# Patient Record
Sex: Female | Born: 1950 | Race: White | Hispanic: No | Marital: Single | Smoking: Former smoker
Health system: Southern US, Community
[De-identification: ages and names within clinical notes are randomized; demographics above are authoritative.]

## PROBLEM LIST (undated history)

## (undated) ENCOUNTER — Encounter

## (undated) ENCOUNTER — Ambulatory Visit: Payer: Medicare (Managed Care) | Attending: Rheumatology | Primary: Rheumatology

## (undated) ENCOUNTER — Encounter: Attending: Rheumatology | Primary: Rheumatology

## (undated) ENCOUNTER — Ambulatory Visit: Payer: MEDICARE | Attending: Rheumatology | Primary: Rheumatology

## (undated) ENCOUNTER — Ambulatory Visit

## (undated) ENCOUNTER — Ambulatory Visit
Attending: Student in an Organized Health Care Education/Training Program | Primary: Student in an Organized Health Care Education/Training Program

## (undated) ENCOUNTER — Telehealth: Attending: Rheumatology | Primary: Rheumatology

## (undated) ENCOUNTER — Ambulatory Visit: Payer: MEDICARE

## (undated) ENCOUNTER — Telehealth

## (undated) ENCOUNTER — Encounter: Attending: Ambulatory Care | Primary: Ambulatory Care

## (undated) DIAGNOSIS — E039 Hypothyroidism, unspecified: Secondary | ICD-10-CM

## (undated) DIAGNOSIS — Z86718 Personal history of other venous thrombosis and embolism: Secondary | ICD-10-CM

## (undated) DIAGNOSIS — R51 Headache: Secondary | ICD-10-CM

## (undated) DIAGNOSIS — R05 Cough: Secondary | ICD-10-CM

## (undated) DIAGNOSIS — M1990 Unspecified osteoarthritis, unspecified site: Secondary | ICD-10-CM

## (undated) DIAGNOSIS — F32A Depression, unspecified: Secondary | ICD-10-CM

## (undated) DIAGNOSIS — F329 Major depressive disorder, single episode, unspecified: Secondary | ICD-10-CM

## (undated) DIAGNOSIS — R059 Cough, unspecified: Secondary | ICD-10-CM

## (undated) DIAGNOSIS — E079 Disorder of thyroid, unspecified: Secondary | ICD-10-CM

## (undated) DIAGNOSIS — Z8719 Personal history of other diseases of the digestive system: Secondary | ICD-10-CM

## (undated) DIAGNOSIS — R519 Headache, unspecified: Secondary | ICD-10-CM

## (undated) DIAGNOSIS — M069 Rheumatoid arthritis, unspecified: Secondary | ICD-10-CM

## (undated) DIAGNOSIS — R112 Nausea with vomiting, unspecified: Secondary | ICD-10-CM

## (undated) DIAGNOSIS — Z9889 Other specified postprocedural states: Secondary | ICD-10-CM

## (undated) DIAGNOSIS — I10 Essential (primary) hypertension: Secondary | ICD-10-CM

## (undated) DIAGNOSIS — K219 Gastro-esophageal reflux disease without esophagitis: Secondary | ICD-10-CM

## (undated) HISTORY — DX: Depression, unspecified: F32.A

## (undated) HISTORY — DX: Personal history of other venous thrombosis and embolism: Z86.718

## (undated) HISTORY — PX: HAND SURGERY: SHX662

## (undated) HISTORY — DX: Unspecified osteoarthritis, unspecified site: M19.90

## (undated) HISTORY — PX: ABDOMINAL EXPLORATION SURGERY: SHX538

## (undated) HISTORY — PX: KNEE SURGERY: SHX244

## (undated) HISTORY — DX: Disorder of thyroid, unspecified: E07.9

## (undated) HISTORY — DX: Rheumatoid arthritis, unspecified: M06.9

## (undated) HISTORY — DX: Essential (primary) hypertension: I10

## (undated) HISTORY — DX: Personal history of other diseases of the digestive system: Z87.19

## (undated) HISTORY — PX: DIAGNOSTIC LAPAROSCOPY: SUR761

## (undated) HISTORY — DX: Major depressive disorder, single episode, unspecified: F32.9

---

## 1987-12-31 HISTORY — PX: WRIST SURGERY: SHX841

## 1999-12-31 HISTORY — PX: APPENDECTOMY: SHX54

## 2004-12-30 HISTORY — PX: OTHER SURGICAL HISTORY: SHX169

## 2005-11-04 DIAGNOSIS — B009 Herpesviral infection, unspecified: Secondary | ICD-10-CM | POA: Insufficient documentation

## 2005-11-04 DIAGNOSIS — J309 Allergic rhinitis, unspecified: Secondary | ICD-10-CM | POA: Insufficient documentation

## 2005-11-04 DIAGNOSIS — M069 Rheumatoid arthritis, unspecified: Secondary | ICD-10-CM | POA: Insufficient documentation

## 2006-07-01 ENCOUNTER — Ambulatory Visit: Payer: Self-pay | Admitting: Family Medicine

## 2006-07-09 ENCOUNTER — Ambulatory Visit: Payer: Self-pay | Admitting: Family Medicine

## 2007-03-17 DIAGNOSIS — F32A Depression, unspecified: Secondary | ICD-10-CM | POA: Insufficient documentation

## 2007-11-10 LAB — HM PAP SMEAR: HM Pap smear: NEGATIVE

## 2008-02-10 DIAGNOSIS — E039 Hypothyroidism, unspecified: Secondary | ICD-10-CM | POA: Insufficient documentation

## 2008-05-02 DIAGNOSIS — I10 Essential (primary) hypertension: Secondary | ICD-10-CM | POA: Insufficient documentation

## 2010-03-31 ENCOUNTER — Ambulatory Visit: Payer: Self-pay | Admitting: Family Medicine

## 2010-06-29 DIAGNOSIS — F411 Generalized anxiety disorder: Secondary | ICD-10-CM | POA: Insufficient documentation

## 2010-07-12 DIAGNOSIS — E539 Vitamin B deficiency, unspecified: Secondary | ICD-10-CM | POA: Insufficient documentation

## 2011-10-09 ENCOUNTER — Ambulatory Visit: Payer: Self-pay | Admitting: Family Medicine

## 2011-10-28 LAB — HM DEXA SCAN

## 2014-03-11 LAB — BASIC METABOLIC PANEL
BUN: 15 mg/dL (ref 4–21)
CREATININE: 1 mg/dL (ref <=1.1)
GLUCOSE: 92 mg/dL
Potassium: 4 mmol/L (ref 3.4–5.3)
Sodium: 141 mmol/L (ref 137–147)

## 2014-03-11 LAB — HEPATIC FUNCTION PANEL
ALT: 14 U/L (ref 7–35)
AST: 18 U/L (ref 13–35)

## 2014-03-11 LAB — LIPID PANEL
Cholesterol: 195 mg/dL (ref 0–200)
HDL: 52 mg/dL (ref 35–70)
LDL Cholesterol: 117 mg/dL
TRIGLYCERIDES: 129 mg/dL (ref 40–160)

## 2014-03-11 LAB — CBC AND DIFFERENTIAL
HEMATOCRIT: 38 % (ref 36–46)
Hemoglobin: 12.7 g/dL (ref 12.0–16.0)
PLATELETS: 323 10*3/uL (ref 150–399)
WBC: 8.1 10*3/mL

## 2014-03-11 LAB — TSH: TSH: 4.69 u[IU]/mL (ref <=5.90)

## 2014-09-08 ENCOUNTER — Ambulatory Visit: Payer: Self-pay | Admitting: Family Medicine

## 2015-11-30 DIAGNOSIS — M19049 Primary osteoarthritis, unspecified hand: Secondary | ICD-10-CM | POA: Insufficient documentation

## 2015-12-11 ENCOUNTER — Other Ambulatory Visit: Payer: Self-pay | Admitting: Family Medicine

## 2015-12-12 NOTE — Telephone Encounter (Signed)
Dr. Reece Agar Patient has not had TSH since 02/2014.

## 2016-03-07 ENCOUNTER — Other Ambulatory Visit: Payer: Self-pay | Admitting: Family Medicine

## 2016-04-09 ENCOUNTER — Ambulatory Visit (INDEPENDENT_AMBULATORY_CARE_PROVIDER_SITE_OTHER): Payer: Medicare PPO | Admitting: Physician Assistant

## 2016-04-09 ENCOUNTER — Encounter: Payer: Self-pay | Admitting: Physician Assistant

## 2016-04-09 VITALS — BP 122/80 | HR 118 | Temp 98.1°F | Resp 19 | Wt 194.4 lb

## 2016-04-09 DIAGNOSIS — E7800 Pure hypercholesterolemia, unspecified: Secondary | ICD-10-CM | POA: Insufficient documentation

## 2016-04-09 DIAGNOSIS — I10 Essential (primary) hypertension: Secondary | ICD-10-CM

## 2016-04-09 DIAGNOSIS — J40 Bronchitis, not specified as acute or chronic: Secondary | ICD-10-CM | POA: Diagnosis not present

## 2016-04-09 DIAGNOSIS — E559 Vitamin D deficiency, unspecified: Secondary | ICD-10-CM | POA: Insufficient documentation

## 2016-04-09 MED ORDER — AZITHROMYCIN 250 MG PO TABS: ORAL_TABLET | ORAL | Status: DC

## 2016-04-09 MED ORDER — TRIAMTERENE-HCTZ 37.5-25 MG PO TABS: 1.0000 | ORAL_TABLET | Freq: Every day | ORAL | Status: DC

## 2016-04-09 MED ORDER — PREDNISONE 10 MG PO TABS: ORAL_TABLET | ORAL | Status: DC

## 2016-04-09 NOTE — Progress Notes (Signed)
Patient: Angela Sherman Female    DOB: December 29, 1951   65 y.o.   MRN: [id number] Visit Date: 04/09/2016  Today's Provider: Margaretann Loveless, PA-C   Chief Complaint  Patient presents with  . URI   Subjective:    URI  This is a new problem. The current episode started 1 to 4 weeks ago (1 weeks-but since beginning of Feb she has been sick 4 times including flu). The problem has been gradually worsening. Maximum temperature: Fever yesterday and Sunday unmeasured. Associated symptoms include congestion, coughing, headaches, rhinorrhea, sinus pain, sneezing, a sore throat and wheezing. Pertinent negatives include no abdominal pain, chest pain, dysuria, ear pain, nausea, plugged ear sensation, swollen glands or vomiting. She has tried acetaminophen and increased fluids for the symptoms. The treatment provided no relief.       Allergies  Allergen Reactions  . Aspirin   . Ciprofloxacin   . Clinoril  [Sulindac]   . Demerol  [Meperidine]   . Methotrexate Derivatives   . Nsaids   . Penicillins   . Remicade  [Infliximab]   . Codeine Nausea And Vomiting    Severe vomiting   Previous Medications   CHOLECALCIFEROL (VITAMIN D) 1000 UNITS TABLET    Take by mouth.   DULOXETINE (CYMBALTA) 30 MG CAPSULE    Take by mouth.   LEVOTHYROXINE (SYNTHROID, LEVOTHROID) 75 MCG TABLET    TAKE 1 TABLET EVERY DAY   TRIAMTERENE-HYDROCHLOROTHIAZIDE (MAXZIDE-25) 37.5-25 MG TABLET    Take by mouth. Reported on 04/09/2016    Review of Systems  Constitutional: Positive for fever (felt feverish but not measured) and fatigue. Negative for chills.  HENT: Positive for congestion, postnasal drip, rhinorrhea, sinus pressure, sneezing and sore throat. Negative for ear pain and tinnitus.   Respiratory: Positive for cough, chest tightness, shortness of breath and wheezing.   Cardiovascular: Negative for chest pain, palpitations and leg swelling.  Gastrointestinal: Negative for nausea, vomiting and abdominal pain.    Genitourinary: Negative for dysuria.  Neurological: Positive for headaches. Negative for dizziness.    Social History  Substance Use Topics  . Smoking status: Former Games developer  . Smokeless tobacco: Not on file  . Alcohol Use: Yes     Comment: Occasional   Objective:   BP 122/80 mmHg  Pulse 118  Temp(Src) 98.1 F (36.7 C) (Oral)  Resp 19  Wt 194 lb 6.4 oz (88.179 kg)  SpO2 97%  Physical Exam  Constitutional: She appears well-developed and well-nourished. No distress.  HENT:  Head: Normocephalic and atraumatic.  Right Ear: Hearing, tympanic membrane, external ear and ear canal normal.  Left Ear: Hearing, tympanic membrane, external ear and ear canal normal.  Nose: Mucosal edema and rhinorrhea present. Right sinus exhibits maxillary sinus tenderness. Right sinus exhibits no frontal sinus tenderness. Left sinus exhibits maxillary sinus tenderness. Left sinus exhibits no frontal sinus tenderness.  Mouth/Throat: Uvula is midline, oropharynx is clear and moist and mucous membranes are normal. No oropharyngeal exudate, posterior oropharyngeal edema or posterior oropharyngeal erythema.  Neck: Normal range of motion. Neck supple. No tracheal deviation present. No thyromegaly present.  Cardiovascular: Regular rhythm and normal heart sounds.  Tachycardia present.  Exam reveals no gallop and no friction rub.   No murmur heard. Pulmonary/Chest: Effort normal. No stridor. No respiratory distress. She has no decreased breath sounds. She has wheezes (mild expiratory throughout). She has no rhonchi. She has no rales.  Lymphadenopathy:    She has no cervical adenopathy.  Skin:  She is not diaphoretic.  Vitals reviewed.       Assessment & Plan:     1. Bronchitis Worsening symptoms. Will treat with z-pak and prednisone taper as below. Discussed using mucinex during the day and delsym at night for cough suppression. She needs to stay well hydrated and try to get plenty of rest. She is to call the  office if symptoms worsen or fail to improve. - azithromycin (ZITHROMAX) 250 MG tablet; Take 2 tablets PO on day one, and one tablet PO daily thereafter until completed.  Dispense: 6 tablet; Refill: 0 - predniSONE (DELTASONE) 10 MG tablet; Take 6 tabs PO on day 1&2, 5 tabs PO on day 3&4, 4 tabs PO on day 5&6, 3 tabs PO on day 7&8, 2 tabs PO on day 9&10, 1 tab PO on day 11&12.  Dispense: 42 tablet; Refill: 0  2. Essential hypertension Stable. Diagnosis pulled for medication refill. Continue current medical treatment plan. - triamterene-hydrochlorothiazide (MAXZIDE-25) 37.5-25 MG tablet; Take 1 tablet by mouth daily. Reported on 04/09/2016  Dispense: 90 tablet; Refill: 3      Margaretann Loveless, PA-C  [facility]

## 2016-04-09 NOTE — Patient Instructions (Signed)

## 2016-04-12 ENCOUNTER — Encounter: Payer: Self-pay | Admitting: Physician Assistant

## 2016-04-23 ENCOUNTER — Encounter: Payer: Self-pay | Admitting: Family Medicine

## 2016-04-23 ENCOUNTER — Ambulatory Visit (INDEPENDENT_AMBULATORY_CARE_PROVIDER_SITE_OTHER): Payer: Medicare PPO | Admitting: Family Medicine

## 2016-04-23 VITALS — BP 96/66 | HR 74 | Temp 97.8°F | Resp 16 | Ht 66.0 in | Wt 195.0 lb

## 2016-04-23 DIAGNOSIS — R1011 Right upper quadrant pain: Secondary | ICD-10-CM | POA: Diagnosis not present

## 2016-04-23 DIAGNOSIS — K219 Gastro-esophageal reflux disease without esophagitis: Secondary | ICD-10-CM

## 2016-04-23 DIAGNOSIS — K2970 Gastritis, unspecified, without bleeding: Secondary | ICD-10-CM | POA: Diagnosis not present

## 2016-04-23 DIAGNOSIS — M549 Dorsalgia, unspecified: Secondary | ICD-10-CM | POA: Diagnosis not present

## 2016-04-23 MED ORDER — OMEPRAZOLE 20 MG PO CPDR: 20.0000 mg | DELAYED_RELEASE_CAPSULE | Freq: Every day | ORAL | Status: DC

## 2016-04-23 NOTE — Progress Notes (Signed)
Patient ID: Angela Sherman, female   DOB: 04-16-1951, 65 y.o.   MRN: [id number]    Subjective:  HPI Pt is here today for abdominal pain, diarrhea and vomiting. She reports that the pain is just under her rib cage on the right side, intermittent for a couple of months. She reports that it has gotten worse over the past couple of days. She was vomiting yesterday and diarrhea. She reports that she has not eaten much since yesterday but her stomach will start to hurt about an hour after she eats.   Prior to Admission medications   Medication Sig Start Date End Date Taking? Authorizing Provider  cholecalciferol (VITAMIN D) 1000 units tablet Take by mouth.   Yes Historical Provider, MD  DULoxetine (CYMBALTA) 30 MG capsule Take by mouth. 05/14/15  Yes Historical Provider, MD  levothyroxine (SYNTHROID, LEVOTHROID) 75 MCG tablet TAKE 1 TABLET EVERY DAY 12/13/15  Yes Maple Hudson., MD  triamterene-hydrochlorothiazide (MAXZIDE-25) 37.5-25 MG tablet Take 1 tablet by mouth daily. Reported on 04/09/2016 Patient not taking: Reported on 04/23/2016 04/09/16   Margaretann Loveless, PA-C    Patient Active Problem List   Diagnosis Date Noted  . Hypercholesteremia 04/09/2016  . Avitaminosis D 04/09/2016  . 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 Vitamin B deficiency 07/12/2010  . Anxiety, generalized 06/29/2010  . Essential (primary) hypertension 05/02/2008  . Adult hypothyroidism 02/10/2008  . Clinical depression 03/17/2007  . Allergic rhinitis 11/04/2005  . Arthritis or polyarthritis, rheumatoid (HCC) 11/04/2005  . Herpes 11/04/2005    Past Medical History  Diagnosis Date  . Anxiety   . Hypertension   . Thyroid disease   . Depression   . Arthritis     Social History   Social History  . Marital Status: Single    Spouse Name: N/A  . Number of Children: N/A  . Years of Education: N/A   Occupational History  . Not on file.   Social History Main Topics  . Smoking status: Former Games developer  .  Smokeless tobacco: Not on file  . Alcohol Use: Yes     Comment: Occasional  . Drug Use: No  . Sexual Activity: No   Other Topics Concern  . Not on file   Social History Narrative    Allergies  Allergen Reactions  . Aspirin   . Ciprofloxacin   . Clinoril  [Sulindac]   . Demerol  [Meperidine]   . Methotrexate Derivatives   . Nsaids   . Penicillins   . Remicade  [Infliximab]   . Codeine Nausea And Vomiting    Severe vomiting    Review of Systems  Constitutional: Positive for malaise/fatigue.  HENT: Negative.   Eyes: Negative.   Respiratory: Negative.   Cardiovascular: Negative.   Gastrointestinal: Positive for heartburn, nausea, vomiting and abdominal pain.  Genitourinary: Negative.   Musculoskeletal: Negative.   Skin: Negative.   Neurological: Negative.   Endo/Heme/Allergies: Negative.   Psychiatric/Behavioral: Negative.     Immunization History  Administered Date(s) Administered  . Influenza-Unspecified 10/20/2015  . Pneumococcal Polysaccharide-23 10/24/2009  . Tdap 11/03/2007, 12/09/2011   Objective:  BP 96/66 mmHg  Pulse 74  Temp(Src) 97.8 F (36.6 C) (Oral)  Resp 16  Ht 5\' 6"  (1.676 m)  Wt 195 lb (88.451 kg)  BMI 31.49 kg/m2  Physical Exam  Constitutional: She is oriented to person, place, and time and well-developed, well-nourished, and in no distress.  Eyes: Conjunctivae and EOM are normal. Pupils are equal, round, and reactive to  light.  Neck: Normal range of motion. Neck supple.  Cardiovascular: Normal rate, regular rhythm, normal heart sounds and intact distal pulses.   Pulmonary/Chest: Effort normal and breath sounds normal.  Abdominal: Soft. Bowel sounds are normal.  equivocal Murphy's sign.  Genitourinary:  Mild right CVA tenderness  Musculoskeletal: Normal range of motion.  Neurological: She is alert and oriented to person, place, and time. She has normal reflexes. Gait normal. GCS score is 15.  Skin: Skin is warm and dry.  Psychiatric:  Mood, memory, affect and judgment normal.    Lab Results  Component Value Date   WBC 8.1 03/11/2014   HGB 12.7 03/11/2014   HCT 38 03/11/2014   PLT 323 03/11/2014   CHOL 195 03/11/2014   TRIG 129 03/11/2014   HDL 52 03/11/2014   LDLCALC 117 03/11/2014   TSH 4.69 03/11/2014    CMP     Component Value Date/Time   NA 141 03/11/2014   K 4.0 03/11/2014   BUN 15 03/11/2014   CREATININE 1.0 03/11/2014   AST 18 03/11/2014   ALT 14 03/11/2014    Assessment and Plan :  1. RUQ pain  - CBC with Differential/Platelet - Comprehensive metabolic panel - Lipase - US Abdomen Complete; Future  2. Gastroesophageal reflux disease, esophagitis presence not specified  - omeprazole (PRILOSEC) 20 MG capsule; Take 1 capsule (20 mg total) by mouth daily.  Dispense: 30 capsule; Refill: 3  3. Gastritis    I have done the exam and reviewed the above chart and it is accurate to the best of my knowledge.  Patient was seen and examined by Dr. [name], and noted scribed by Dimas Chyle, CMA  [name] MD [facility] 04/23/2016 4:26 PM

## 2016-04-24 ENCOUNTER — Telehealth: Payer: Self-pay

## 2016-04-24 LAB — COMPREHENSIVE METABOLIC PANEL
A/G RATIO: 2 (ref 1.2–2.2)
ALT: 18 IU/L (ref 0–32)
AST: 16 IU/L (ref 0–40)
Albumin: 4.5 g/dL (ref 3.6–4.8)
Alkaline Phosphatase: 104 IU/L (ref 39–117)
BUN/Creatinine Ratio: 17 (ref 12–28)
BUN: 17 mg/dL (ref 8–27)
Bilirubin Total: 0.4 mg/dL (ref 0.0–1.2)
CALCIUM: 10.3 mg/dL (ref 8.7–10.3)
CO2: 25 mmol/L (ref 18–29)
CREATININE: 1.01 mg/dL — AB (ref 0.57–1.00)
Chloride: 101 mmol/L (ref 96–106)
GFR, EST AFRICAN AMERICAN: 69 mL/min/{1.73_m2} (ref >59)
GFR, EST NON AFRICAN AMERICAN: 60 mL/min/{1.73_m2} (ref >59)
GLUCOSE: 104 mg/dL — AB (ref 65–99)
Globulin, Total: 2.2 g/dL (ref 1.5–4.5)
POTASSIUM: 4.7 mmol/L (ref 3.5–5.2)
Sodium: 140 mmol/L (ref 134–144)
TOTAL PROTEIN: 6.7 g/dL (ref 6.0–8.5)

## 2016-04-24 LAB — CBC WITH DIFFERENTIAL/PLATELET
BASOS ABS: 0 10*3/uL (ref 0.0–0.2)
BASOS: 0 %
EOS (ABSOLUTE): 0.1 10*3/uL (ref 0.0–0.4)
Eos: 1 %
Hematocrit: 46.9 % — ABNORMAL HIGH (ref 34.0–46.6)
Hemoglobin: 15.3 g/dL (ref 11.1–15.9)
IMMATURE GRANS (ABS): 0 10*3/uL (ref 0.0–0.1)
IMMATURE GRANULOCYTES: 0 %
LYMPHS: 15 %
Lymphocytes Absolute: 1.7 10*3/uL (ref 0.7–3.1)
MCH: 27.6 pg (ref 26.6–33.0)
MCHC: 32.6 g/dL (ref 31.5–35.7)
MCV: 85 fL (ref 79–97)
MONOS ABS: 0.8 10*3/uL (ref 0.1–0.9)
Monocytes: 6 %
NEUTROS PCT: 78 %
Neutrophils Absolute: 9.1 10*3/uL — ABNORMAL HIGH (ref 1.4–7.0)
PLATELETS: 341 10*3/uL (ref 150–379)
RBC: 5.55 x10E6/uL — AB (ref 3.77–5.28)
RDW: 14.1 % (ref 12.3–15.4)
WBC: 11.8 10*3/uL — AB (ref 3.4–10.8)

## 2016-04-24 LAB — LIPASE: Lipase: 26 U/L (ref 0–59)

## 2016-04-24 NOTE — Telephone Encounter (Signed)
-----   Message from Richard L Gilbert Jr., MD sent at 04/24/2016  8:10 AM EDT ----- Labs normal. 

## 2016-04-24 NOTE — Telephone Encounter (Signed)
Patient advised as directed below. Patient verbalized understanding.  

## 2016-04-30 ENCOUNTER — Ambulatory Visit: Payer: Self-pay

## 2016-05-01 ENCOUNTER — Ambulatory Visit
Admission: RE | Admit: 2016-05-01 | Discharge: 2016-05-01 | Disposition: A | Discharge status: Home / Self Care | Payer: Medicare PPO | Source: Ambulatory Visit | Attending: Family Medicine | Admitting: Family Medicine

## 2016-05-01 ENCOUNTER — Telehealth: Payer: Self-pay

## 2016-05-01 DIAGNOSIS — K8020 Calculus of gallbladder without cholecystitis without obstruction: Secondary | ICD-10-CM | POA: Insufficient documentation

## 2016-05-01 DIAGNOSIS — R93422 Abnormal radiologic findings on diagnostic imaging of left kidney: Secondary | ICD-10-CM | POA: Insufficient documentation

## 2016-05-01 DIAGNOSIS — M549 Dorsalgia, unspecified: Secondary | ICD-10-CM | POA: Diagnosis present

## 2016-05-01 DIAGNOSIS — R1011 Right upper quadrant pain: Secondary | ICD-10-CM | POA: Diagnosis present

## 2016-05-01 NOTE — Telephone Encounter (Signed)
Please refer to surgery, order put in-aa

## 2016-05-03 ENCOUNTER — Encounter: Payer: Self-pay | Admitting: General Surgery

## 2016-05-03 ENCOUNTER — Encounter: Payer: Self-pay | Admitting: *Deleted

## 2016-05-06 ENCOUNTER — Ambulatory Visit: Payer: Medicare PPO | Admitting: Family Medicine

## 2016-05-09 ENCOUNTER — Ambulatory Visit: Payer: Self-pay | Admitting: General Surgery

## 2016-05-20 ENCOUNTER — Ambulatory Visit (INDEPENDENT_AMBULATORY_CARE_PROVIDER_SITE_OTHER): Payer: Medicare PPO | Admitting: General Surgery

## 2016-05-20 ENCOUNTER — Encounter: Payer: Self-pay | Admitting: General Surgery

## 2016-05-20 VITALS — BP 130/70 | HR 80 | Resp 12 | Ht 66.0 in | Wt 200.0 lb

## 2016-05-20 DIAGNOSIS — K8020 Calculus of gallbladder without cholecystitis without obstruction: Secondary | ICD-10-CM

## 2016-05-20 DIAGNOSIS — M069 Rheumatoid arthritis, unspecified: Secondary | ICD-10-CM | POA: Diagnosis not present

## 2016-05-20 NOTE — Progress Notes (Signed)
Patient ID: Angela Sherman, female   DOB: May 29, 1951, 65 y.o.   MRN: [id number]  Chief Complaint  Patient presents with  . Cholelithiasis    HPI Angela Sherman is a 65 y.o. female here for evaluation of her gallbladder. She had an ultrasound done on 05/01/16 showing gallstones. She has had right quadrant and flank pain for 6 months. 2 months ago she started having nausea and vomiting. She reports one episode of chalky bowel movements within the last 2 months, during an episode of pain. She experiences frequent nausea during the pain episodes, but rare episodes of vomiting. She is not clearly appreciated a dietary connection. She reports that she sleeps poorly due to diffuse joint pain from her long-standing rheumatoid arthritis, and she has not paid any particular attention to episodes of abdominal pain occurring at night.  She has been on steroids now for 35 years, presently managed on 5 mg daily. She has been through all of the biologic agents, and with Remicade had an episode described as peritonitis requiring laparoscopy followed by laparotomy during which time incidental appendectomy was completed. This was in 2001. She recalls that the physician said there was inflammation in the right upper quadrant at that time. She has had no further episodes of abdominal symptoms until recently.  The patient is accompanied today by her sister,Angela Sherman.  HPI  Past Medical History  Diagnosis Date  . Anxiety   . Hypertension   . Thyroid disease   . Depression   . Arthritis   . History of peritonitis   . History of deep vein thrombosis     Past Surgical History  Procedure Laterality Date  . Reconstruction of the right hand  2006    surgery  . Appendectomy  2001    Laparoscopy, Dx-Peritontis  . Wrist surgery Left 1989    Dx-RA  . Knee surgery Right     Family History  Problem Relation Age of Onset  . Hypertension Brother     Social History Social History  Substance Use Topics  . Smoking  status: Former Games developer  . Smokeless tobacco: None  . Alcohol Use: Yes     Comment: Occasional    Allergies  Allergen Reactions  . Aspirin   . Ciprofloxacin   . Clinoril  [Sulindac]   . Demerol  [Meperidine]   . Methotrexate Derivatives   . Nsaids   . Penicillins   . Remicade  [Infliximab]   . Codeine Nausea And Vomiting    Severe vomiting    Current Outpatient Prescriptions  Medication Sig Dispense Refill  . cholecalciferol (VITAMIN D) 1000 units tablet Take by mouth.    . DULoxetine (CYMBALTA) 30 MG capsule Take by mouth.    [name] KRILL OIL PO Take by mouth.    . levothyroxine (SYNTHROID, LEVOTHROID) 75 MCG tablet TAKE 1 TABLET EVERY DAY 90 tablet 3  . omeprazole (PRILOSEC) 20 MG capsule Take 1 capsule (20 mg total) by mouth daily. 30 capsule 3  . predniSONE (DELTASONE) 5 MG tablet Take 5 mg by mouth daily with breakfast.    . triamterene-hydrochlorothiazide (MAXZIDE-25) 37.5-25 MG tablet Take 1 tablet by mouth daily. Reported on 04/09/2016 90 tablet 3   No current facility-administered medications for this visit.    Review of Systems Review of Systems  Constitutional: Negative.   HENT: Negative.   Eyes: Negative.   Respiratory: Negative.   Cardiovascular: Negative.   Gastrointestinal: Positive for nausea, vomiting and abdominal pain (right). Negative for diarrhea,  constipation, blood in stool, abdominal distention, anal bleeding and rectal pain.  Endocrine: Negative.   Genitourinary: Negative.   Musculoskeletal: Positive for joint swelling.  Skin: Negative.   Allergic/Immunologic: Negative.   Neurological: Negative.   Hematological: Negative.   Psychiatric/Behavioral: Negative.     Blood pressure 130/70, pulse 80, resp. rate 12, height 5\' 6"  (1.676 m), weight 200 lb (90.719 kg).  Physical Exam Physical Exam  Constitutional: She is oriented to person, place, and time. She appears well-developed and well-nourished.  HENT:  Head: Normocephalic and atraumatic.  Eyes:  Conjunctivae are normal. No scleral icterus.  Neck: Neck supple. No tracheal deviation present. No thyromegaly present.  Cardiovascular: Normal rate, regular rhythm and normal heart sounds.   Pulmonary/Chest: Effort normal and breath sounds normal.    Abdominal: Soft. Bowel sounds are normal.    Midline incision above the umbilicus.   Musculoskeletal:       Arms:      Legs: Lymphadenopathy:    She has no cervical adenopathy.  Neurological: She is alert and oriented to person, place, and time.  Skin: Skin is warm and dry.  Psychiatric: She has a normal mood and affect.    Data Reviewed Abdominal ultrasound dated 05/01/2016 showed multiple gallstones measuring up to 2.7 cm in diameter. Negative Murphy sign. Common bile duct 3.4 mm. Normal hepatic architecture. IVC reported as normal. 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 CBC and comprehensive metabolic panel completed 04/26/2016 was notable for mild elevation of the white blood cell count, under 12,000 and a creatinine of 1.0 with an estimated GFR of 60. Normal liver function studies.  Assessment    Gallstones, possible passed common duct stone.  Steroid dependent rheumatoid arthritis.  Past history right lower extremity DVT, no evidence of chronic changes.  Recent wrist surgery (right) with marked local discomfort. Plans for upcoming left wrist surgery.    Plan    The patient is at increased risk for surgery based on her steroid dependence as well as the previous laparotomy including a report of an inflammatory process in the right upper quadrant.  Attempts will be made to obtain records from the 2001 exploration completed at [facility].  [facility].Laparoscopic Cholecystectomy with Intraoperative Cholangiogram. The procedure, including it's potential risks and complications (including but not limited to infection, bleeding, injury to intra-abdominal organs or bile ducts, bile leak, poor cosmetic result, sepsis and death) were  discussed with the patient in detail. Non-operative options, including their inherent risks (acute calculous cholecystitis with possible choledocholithiasis or gallstone pancreatitis, with the risk of ascending cholangitis, sepsis, and death) were discussed as well. The patient expressed and understanding of what we discussed and wishes to proceed with laparoscopic cholecystectomy. The patient further understands that if it is technically not possible, or it is unsafe to proceed laparoscopically, that I will convert to an open cholecystectomy.  Patient's surgery has been scheduled for 06-04-16 at [facility].    PCP:  [facility], Richard L This has been scribed by Wendelyn Breslow LPN    Sinda Du 05/21/2016, 9:58 AM

## 2016-05-20 NOTE — Patient Instructions (Addendum)
Laparoscopic Cholecystectomy Laparoscopic cholecystectomy is surgery to remove the gallbladder. The gallbladder is located in the upper right part of the abdomen, behind the liver. It is a storage sac for bile, which is produced in the liver. Bile aids in the digestion and absorption of fats. Cholecystectomy is often done for inflammation of the gallbladder (cholecystitis). This condition is usually caused by a buildup of gallstones (cholelithiasis) in the gallbladder. Gallstones can block the flow of bile, and that can result in inflammation and pain. In severe cases, emergency surgery may be required. If emergency surgery is not required, you will have time to prepare for the procedure. Laparoscopic surgery is an alternative to open surgery. Laparoscopic surgery has a shorter recovery time. Your common bile duct may also need to be examined during the procedure. If stones are found in the common bile duct, they may be removed. LET YOUR HEALTH CARE PROVIDER KNOW ABOUT:  Any allergies you have.  All medicines you are taking, including vitamins, herbs, eye drops, creams, and over-the-counter medicines.  Previous problems you or members of your family have had with the use of anesthetics.  Any blood disorders you have.  Previous surgeries you have had.  Any medical conditions you have. RISKS AND COMPLICATIONS Generally, this is a safe procedure. However, problems may occur, including:  Infection.  Bleeding.  Allergic reactions to medicines.  Damage to other structures or organs.  A stone remaining in the common bile duct.  A bile leak from the cyst duct that is clipped when your gallbladder is removed.  The need to convert to open surgery, which requires a larger incision in the abdomen. This may be necessary if your surgeon thinks that it is not safe to continue with a laparoscopic procedure. BEFORE THE PROCEDURE  Ask your health care provider about:  Changing or stopping your  regular medicines. This is especially important if you are taking diabetes medicines or blood thinners.  Taking medicines such as aspirin and ibuprofen. These medicines can thin your blood. Do not take these medicines before your procedure if your health care provider instructs you not to.  Follow instructions from your health care provider about eating or drinking restrictions.  Let your health care provider know if you develop a cold or an infection before surgery.  Plan to have someone take you home after the procedure.  Ask your health care provider how your surgical site will be marked or identified.  You may be given antibiotic medicine to help prevent infection. PROCEDURE  To reduce your risk of infection:  Your health care team will wash or sanitize their hands.  Your skin will be washed with soap.  An IV tube may be inserted into one of your veins.  You will be given a medicine to make you fall asleep (general anesthetic).  A breathing tube will be placed in your mouth.  The surgeon will make several small cuts (incisions) in your abdomen.  A thin, lighted tube (laparoscope) that has a tiny camera on the end will be inserted through one of the small incisions. The camera on the laparoscope will send a picture to a TV screen (monitor) in the operating room. This will give the surgeon a good view inside your abdomen.  A gas will be pumped into your abdomen. This will expand your abdomen to give the surgeon more room to perform the surgery.  Other tools that are needed for the procedure will be inserted through the other incisions. The gallbladder will   be removed through one of the incisions.  After your gallbladder has been removed, the incisions will be closed with stitches (sutures), staples, or skin glue.  Your incisions may be covered with a bandage (dressing). The procedure may vary among health care providers and hospitals. AFTER THE PROCEDURE  Your blood  pressure, heart rate, breathing rate, and blood oxygen level will be monitored often until the medicines you were given have worn off.  You will be given medicines as needed to control your pain.   This information is not intended to replace advice given to you by your health care provider. Make sure you discuss any questions you have with your health care provider.   Document Released: 12/16/2005 Document Revised: 09/06/2015 Document Reviewed: 07/28/2013 Elsevier Interactive Patient Education 2016 [employer].  Patient's surgery has been scheduled for 06-04-16 at [facility].

## 2016-05-21 ENCOUNTER — Encounter: Payer: Self-pay | Admitting: General Surgery

## 2016-05-21 DIAGNOSIS — K8020 Calculus of gallbladder without cholecystitis without obstruction: Secondary | ICD-10-CM | POA: Insufficient documentation

## 2016-05-21 DIAGNOSIS — M069 Rheumatoid arthritis, unspecified: Secondary | ICD-10-CM | POA: Insufficient documentation

## 2016-05-21 NOTE — Progress Notes (Signed)
Operative report from [facility] dated 02/02/2001 was reviewed. The patient underwent diagnostic laparoscopy and laparotomy as well as intraoperative EGD. Procedure was completed for peritonitis with increasing fevers and CT scan demonstrating a phlegmon around the ascending colon. In the procedure the right colon was mobilized. A Coker maneuver was done to the duodenum. Dissection in the lesser sac was undertaken. The majority of the inflammatory exudate was in the area near the portal triad. Was no evidence of perforation identified. A small laceration of the posterior lobe on the right side liver was treated with Surgicel. EGD was normal.  Pathology on the removed appendix showed benign periappendicitis and fibrous involution of the lumen. No evidence of acute appendicitis.

## 2016-05-21 NOTE — H&P (Signed)
HPI  Angela Sherman is a 65 y.o. female here for evaluation of her gallbladder. She had an ultrasound done on 05/01/16 showing gallstones. She has had right quadrant and flank pain for 6 months. 2 months ago she started having nausea and vomiting. She reports one episode of chalky bowel movements within the last 2 months, during an episode of pain. She experiences frequent nausea during the pain episodes, but rare episodes of vomiting. She is not clearly appreciated a dietary connection. She reports that she sleeps poorly due to diffuse joint pain from her long-standing rheumatoid arthritis, and she has not paid any particular attention to episodes of abdominal pain occurring at night.  She has been on steroids now for 35 years, presently managed on 5 mg daily. She has been through all of the biologic agents, and with Remicade had an episode described as peritonitis requiring laparoscopy followed by laparotomy during which time incidental appendectomy was completed. This was in 2001. She recalls that the physician said there was inflammation in the right upper quadrant at that time. She has had no further episodes of abdominal symptoms until recently.  The patient is accompanied today by her sister,Mimi Leonor Liv.  HPI  Past Medical History   Diagnosis  Date   .  Anxiety    .  Hypertension    .  Thyroid disease    .  Depression    .  Arthritis    .  History of peritonitis    .  History of deep vein thrombosis     Past Surgical History   Procedure  Laterality  Date   .  Reconstruction of the right hand   2006     surgery   .  Appendectomy   2001     Laparoscopy, Dx-Peritontis   .  Wrist surgery  Left  1989     Dx-RA   .  Knee surgery  Right     Family History   Problem  Relation  Age of Onset   .  Hypertension  Brother     Social History  Social History   Substance Use Topics   .  Smoking status:  Former Games developer   .  Smokeless tobacco:  None   .  Alcohol Use:  Yes      Comment: Occasional      Allergies   Allergen  Reactions   .  Aspirin    .  Ciprofloxacin    .  Clinoril [Sulindac]    .  Demerol [Meperidine]    .  Methotrexate Derivatives    .  Nsaids    .  Penicillins    .  Remicade [Infliximab]    .  Codeine  Nausea And Vomiting     Severe vomiting    Current Outpatient Prescriptions   Medication  Sig  Dispense  Refill   .  cholecalciferol (VITAMIN D) 1000 units tablet  Take by mouth.     .  DULoxetine (CYMBALTA) 30 MG capsule  Take by mouth.     [name]  KRILL OIL PO  Take by mouth.     .  levothyroxine (SYNTHROID, LEVOTHROID) 75 MCG tablet  TAKE 1 TABLET EVERY DAY  90 tablet  3   .  omeprazole (PRILOSEC) 20 MG capsule  Take 1 capsule (20 mg total) by mouth daily.  30 capsule  3   .  predniSONE (DELTASONE) 5 MG tablet  Take 5 mg by mouth daily with breakfast.     .  triamterene-hydrochlorothiazide (MAXZIDE-25) 37.5-25 MG tablet  Take 1 tablet by mouth daily. Reported on 04/09/2016  90 tablet  3    No current facility-administered medications for this visit.    Review of Systems  Review of Systems  Constitutional: Negative.  HENT: Negative.  Eyes: Negative.  Respiratory: Negative.  Cardiovascular: Negative.  Gastrointestinal: Positive for nausea, vomiting and abdominal pain (right). Negative for diarrhea, constipation, blood in stool, abdominal distention, anal bleeding and rectal pain.  Endocrine: Negative.  Genitourinary: Negative.  Musculoskeletal: Positive for joint swelling.  Skin: Negative.  Allergic/Immunologic: Negative.  Neurological: Negative.  Hematological: Negative.  Psychiatric/Behavioral: Negative.   Blood pressure 130/70, pulse 80, resp. rate 12, height 5\' 6"  (1.676 m), weight 200 lb (90.719 kg).  Physical Exam  Physical Exam  Constitutional: She is oriented to person, place, and time. She appears well-developed and well-nourished.  HENT:  Head: Normocephalic and atraumatic.  Eyes: Conjunctivae are normal. No scleral icterus.  Neck: Neck  supple. No tracheal deviation present. No thyromegaly present.  Cardiovascular: Normal rate, regular rhythm and normal heart sounds.  Pulmonary/Chest: Effort normal and breath sounds normal.    Abdominal: Soft. Bowel sounds are normal.    Midline incision above the umbilicus.  Musculoskeletal:  Arms: Legs: Lymphadenopathy:  She has no cervical adenopathy.  Neurological: She is alert and oriented to person, place, and time.  Skin: Skin is warm and dry.  Psychiatric: She has a normal mood and affect.   Data Reviewed  Abdominal ultrasound dated 05/01/2016 showed multiple gallstones measuring up to 2.7 cm in diameter. Negative Murphy sign. Common bile duct 3.4 mm. Normal hepatic architecture. IVC reported as normal. 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  CBC and comprehensive metabolic panel completed 04/26/2016 was notable for mild elevation of the white blood cell count, under 12,000 and a creatinine of 1.0 with an estimated GFR of 60. Normal liver function studies.  Assessment   Gallstones, possible passed common duct stone.  Steroid dependent rheumatoid arthritis.  Past history right lower extremity DVT, no evidence of chronic changes.  Recent wrist surgery (right) with marked local discomfort. Plans for upcoming left wrist surgery.   Plan   The patient is at increased risk for surgery based on her steroid dependence as well as the previous laparotomy including a report of an inflammatory process in the right upper quadrant.  Attempts will be made to obtain records from the 2001 exploration completed at [facility].  [facility].Laparoscopic Cholecystectomy with Intraoperative Cholangiogram. The procedure, including it's potential risks and complications (including but not limited to infection, bleeding, injury to intra-abdominal organs or bile ducts, bile leak, poor cosmetic result, sepsis and death) were discussed with the patient in detail. Non-operative options, including their inherent  risks (acute calculous cholecystitis with possible choledocholithiasis or gallstone pancreatitis, with the risk of ascending cholangitis, sepsis, and death) were discussed as well. The patient expressed and understanding of what we discussed and wishes to proceed with laparoscopic cholecystectomy. The patient further understands that if it is technically not possible, or it is unsafe to proceed laparoscopically, that I will convert to an open cholecystectomy.  Patient's surgery has been scheduled for 06-04-16 at [facility].   PCP: [facility], Richard L  This has been scribed by Wendelyn Breslow LPN  Sinda Du  05/21/2016, 9:58 AM

## 2016-05-23 ENCOUNTER — Encounter
Admission: RE | Admit: 2016-05-23 | Discharge: 2016-05-23 | Disposition: A | Discharge status: Home / Self Care | Payer: Medicare PPO | Source: Ambulatory Visit | Attending: General Surgery | Admitting: General Surgery

## 2016-05-23 DIAGNOSIS — Z01812 Encounter for preprocedural laboratory examination: Secondary | ICD-10-CM | POA: Diagnosis present

## 2016-05-23 HISTORY — DX: Gastro-esophageal reflux disease without esophagitis: K21.9

## 2016-05-23 HISTORY — DX: Other specified postprocedural states: R11.2

## 2016-05-23 HISTORY — DX: Other specified postprocedural states: Z98.890

## 2016-05-23 LAB — POTASSIUM: Potassium: 3.6 mmol/L (ref 3.5–5.1)

## 2016-05-23 NOTE — Patient Instructions (Signed)
  Your procedure is scheduled on:June 04, 2016 (Tuesday) Report to Same Day Surgery 2nd floor Medical Mall To find out your arrival time please call [phone] between 1PM - 3PM on June 03, 2016 (Monday)  Remember: Instructions that are not followed completely may result in serious medical risk, up to and including death, or upon the discretion of your surgeon and anesthesiologist your surgery may need to be rescheduled.    _x___ 1. Do not eat food or drink liquids after midnight. No gum chewing or hard candies.     __x__ 2. No Alcohol for 24 hours before or after surgery.   ____ 3. Bring all medications with you on the day of surgery if instructed.    __x__ 4. Notify your doctor if there is any change in your medical condition     (cold, fever, infections).     Do not wear jewelry, make-up, hairpins, clips or nail polish.  Do not wear lotions, powders, or perfumes. You may wear deodorant.  Do not shave 48 hours prior to surgery. Men may shave face and neck.  Do not bring valuables to the hospital.    [facility] is not responsible for any belongings or valuables.               Contacts, dentures or bridgework may not be worn into surgery.  Leave your suitcase in the car. After surgery it may be brought to your room.  For patients admitted to the hospital, discharge time is determined by your treatment team.   Patients discharged the day of surgery will not be allowed to drive home.    Please read over the following fact sheets that you were given:   [facility] Preparing for Surgery and or MRSA Information   _x___ Take these medicines the morning of surgery with A SIP OF WATER:    1. Omeprazole (Omeprazole at bedtime on June 5)  2.Cymbalta  3.  4.  5.  6.  ____ Fleet Enema (as directed)   _x___ Use CHG Soap or sage wipes as directed on instruction sheet   ____ Use inhalers on the day of surgery and bring to hospital day of surgery  ____ Stop metformin 2 days prior to  surgery    ____ Take 1/2 of usual insulin dose the night before surgery and none on the morning of surgery          _x___ Stop aspirin or coumadin, or plavix  (N/A)  _x__ Stop Anti-inflammatories such as Advil, Aleve, Ibuprofen, Motrin, Naproxen,          Naprosyn, Goodies powders or aspirin products. Ok to take Tylenol.   _x___ Stop supplements until after surgery.  (STOP KRILL OIL NOW)  ____ Bring C-Pap to the hospital.

## 2016-06-04 ENCOUNTER — Ambulatory Visit: Payer: Medicare PPO | Admitting: Anesthesiology

## 2016-06-04 ENCOUNTER — Ambulatory Visit: Payer: Medicare PPO

## 2016-06-04 ENCOUNTER — Encounter
Admission: AD | Disposition: A | Discharge status: Home / Self Care | Payer: Self-pay | Source: Ambulatory Visit | Attending: General Surgery

## 2016-06-04 ENCOUNTER — Inpatient Hospital Stay
Admission: AD | Admit: 2016-06-04 | Discharge: 2016-06-06 | DRG: 416 | Disposition: A | Discharge status: Home / Self Care | Payer: Medicare PPO | Source: Ambulatory Visit | Attending: General Surgery | Admitting: General Surgery

## 2016-06-04 ENCOUNTER — Encounter: Payer: Self-pay | Admitting: *Deleted

## 2016-06-04 DIAGNOSIS — K8044 Calculus of bile duct with chronic cholecystitis without obstruction: Secondary | ICD-10-CM | POA: Diagnosis not present

## 2016-06-04 DIAGNOSIS — K660 Peritoneal adhesions (postprocedural) (postinfection): Secondary | ICD-10-CM | POA: Diagnosis present

## 2016-06-04 DIAGNOSIS — M069 Rheumatoid arthritis, unspecified: Secondary | ICD-10-CM | POA: Diagnosis present

## 2016-06-04 DIAGNOSIS — Z87891 Personal history of nicotine dependence: Secondary | ICD-10-CM

## 2016-06-04 DIAGNOSIS — I10 Essential (primary) hypertension: Secondary | ICD-10-CM | POA: Diagnosis present

## 2016-06-04 DIAGNOSIS — Z5331 Laparoscopic surgical procedure converted to open procedure: Secondary | ICD-10-CM

## 2016-06-04 DIAGNOSIS — Z79899 Other long term (current) drug therapy: Secondary | ICD-10-CM

## 2016-06-04 DIAGNOSIS — K8010 Calculus of gallbladder with chronic cholecystitis without obstruction: Secondary | ICD-10-CM | POA: Diagnosis present

## 2016-06-04 DIAGNOSIS — Z7952 Long term (current) use of systemic steroids: Secondary | ICD-10-CM

## 2016-06-04 DIAGNOSIS — K8020 Calculus of gallbladder without cholecystitis without obstruction: Secondary | ICD-10-CM

## 2016-06-04 DIAGNOSIS — K219 Gastro-esophageal reflux disease without esophagitis: Secondary | ICD-10-CM | POA: Diagnosis present

## 2016-06-04 DIAGNOSIS — E039 Hypothyroidism, unspecified: Secondary | ICD-10-CM | POA: Diagnosis present

## 2016-06-04 DIAGNOSIS — Z86718 Personal history of other venous thrombosis and embolism: Secondary | ICD-10-CM

## 2016-06-04 HISTORY — PX: CHOLECYSTECTOMY: SHX55

## 2016-06-04 LAB — CBC
HCT: 37.6 % (ref 35.0–47.0)
HEMOGLOBIN: 12.4 g/dL (ref 12.0–16.0)
MCH: 27.2 pg (ref 26.0–34.0)
MCHC: 33.1 g/dL (ref 32.0–36.0)
MCV: 82.3 fL (ref 80.0–100.0)
PLATELETS: 284 10*3/uL (ref 150–440)
RBC: 4.57 MIL/uL (ref 3.80–5.20)
RDW: 14.6 % — ABNORMAL HIGH (ref 11.5–14.5)
WBC: 16.8 10*3/uL — AB (ref 3.6–11.0)

## 2016-06-04 LAB — CREATININE, SERUM
CREATININE: 1.07 mg/dL — AB (ref 0.44–1.00)
GFR, EST NON AFRICAN AMERICAN: 54 mL/min — AB (ref >60)

## 2016-06-04 SURGERY — LAPAROSCOPIC CHOLECYSTECTOMY WITH INTRAOPERATIVE CHOLANGIOGRAM
Anesthesia: General | Wound class: Clean Contaminated

## 2016-06-04 MED ORDER — CEFAZOLIN SODIUM-DEXTROSE 2-4 GM/100ML-% IV SOLN
INTRAVENOUS | Status: AC
Start: 2016-06-04 — End: 2016-06-04
  Administered 2016-06-04: 2 g via INTRAVENOUS
  Filled 2016-06-04: qty 100

## 2016-06-04 MED ORDER — PREDNISONE 5 MG PO TABS
5.0000 mg | ORAL_TABLET | Freq: Every day | ORAL | Status: DC
  Administered 2016-06-05 – 2016-06-06 (×2): 5 mg via ORAL
  Filled 2016-06-04 (×2): qty 1

## 2016-06-04 MED ORDER — BUPIVACAINE-EPINEPHRINE (PF) 0.5% -1:200000 IJ SOLN
INTRAMUSCULAR | Status: AC
  Filled 2016-06-04: qty 30

## 2016-06-04 MED ORDER — SODIUM CHLORIDE 0.9 % IJ SOLN
INTRAMUSCULAR | Status: AC
  Filled 2016-06-04: qty 50

## 2016-06-04 MED ORDER — METHYLPREDNISOLONE SODIUM SUCC 125 MG IJ SOLR
INTRAMUSCULAR | Status: DC | PRN
  Administered 2016-06-04: 125 mg via INTRAVENOUS

## 2016-06-04 MED ORDER — MORPHINE SULFATE (PF) 2 MG/ML IV SOLN: 2.0000 mg | INTRAVENOUS | Status: DC | PRN

## 2016-06-04 MED ORDER — DEXAMETHASONE SODIUM PHOSPHATE 10 MG/ML IJ SOLN
INTRAMUSCULAR | Status: DC | PRN
  Administered 2016-06-04: 10 mg via INTRAVENOUS

## 2016-06-04 MED ORDER — PROPOFOL 10 MG/ML IV BOLUS
INTRAVENOUS | Status: DC | PRN
  Administered 2016-06-04: 150 mg via INTRAVENOUS

## 2016-06-04 MED ORDER — KETOROLAC TROMETHAMINE 30 MG/ML IJ SOLN: INTRAMUSCULAR | Status: DC | PRN

## 2016-06-04 MED ORDER — PHENYLEPHRINE HCL 10 MG/ML IJ SOLN
10000.0000 ug | INTRAMUSCULAR | Status: DC | PRN
  Administered 2016-06-04: 20 ug/min via INTRAVENOUS

## 2016-06-04 MED ORDER — HYDROCODONE-ACETAMINOPHEN 5-325 MG PO TABS: 1.0000 | ORAL_TABLET | ORAL | Status: DC | PRN

## 2016-06-04 MED ORDER — EPHEDRINE SULFATE 50 MG/ML IJ SOLN
INTRAMUSCULAR | Status: DC | PRN
  Administered 2016-06-04 (×2): 10 mg via INTRAVENOUS

## 2016-06-04 MED ORDER — ONDANSETRON 4 MG PO TBDP
4.0000 mg | ORAL_TABLET | Freq: Three times a day (TID) | ORAL | Status: DC | PRN
  Filled 2016-06-04: qty 1

## 2016-06-04 MED ORDER — ACETAMINOPHEN 10 MG/ML IV SOLN
INTRAVENOUS | Status: DC | PRN
  Administered 2016-06-04: 1000 mg via INTRAVENOUS

## 2016-06-04 MED ORDER — LACTATED RINGERS IV SOLN
INTRAVENOUS | Status: DC
  Administered 2016-06-04 – 2016-06-05 (×3): via INTRAVENOUS

## 2016-06-04 MED ORDER — SCOPOLAMINE 1 MG/3DAYS TD PT72
MEDICATED_PATCH | TRANSDERMAL | Status: AC
  Administered 2016-06-04: 07:00:00
  Filled 2016-06-04: qty 1

## 2016-06-04 MED ORDER — DULOXETINE HCL 30 MG PO CPEP
30.0000 mg | ORAL_CAPSULE | Freq: Every day | ORAL | Status: DC
  Administered 2016-06-05: 30 mg via ORAL
  Filled 2016-06-04: qty 1

## 2016-06-04 MED ORDER — ROCURONIUM BROMIDE 100 MG/10ML IV SOLN
INTRAVENOUS | Status: DC | PRN
  Administered 2016-06-04 (×3): 10 mg via INTRAVENOUS
  Administered 2016-06-04: 20 mg via INTRAVENOUS

## 2016-06-04 MED ORDER — HEPARIN SODIUM (PORCINE) 5000 UNIT/ML IJ SOLN
INTRAMUSCULAR | Status: AC
  Administered 2016-06-04: 5000 [IU] via SUBCUTANEOUS
  Filled 2016-06-04: qty 1

## 2016-06-04 MED ORDER — SUCCINYLCHOLINE CHLORIDE 20 MG/ML IJ SOLN
INTRAMUSCULAR | Status: DC | PRN
  Administered 2016-06-04: 100 mg via INTRAVENOUS

## 2016-06-04 MED ORDER — LEVOTHYROXINE SODIUM 75 MCG PO TABS
75.0000 ug | ORAL_TABLET | Freq: Every day | ORAL | Status: DC
  Administered 2016-06-05 – 2016-06-06 (×2): 75 ug via ORAL
  Filled 2016-06-04 (×2): qty 1

## 2016-06-04 MED ORDER — HEPARIN SODIUM (PORCINE) 5000 UNIT/ML IJ SOLN
5000.0000 [IU] | Freq: Once | INTRAMUSCULAR | Status: AC
  Administered 2016-06-04: 5000 [IU] via SUBCUTANEOUS

## 2016-06-04 MED ORDER — PHENYLEPHRINE HCL 10 MG/ML IJ SOLN
INTRAMUSCULAR | Status: DC | PRN
  Administered 2016-06-04 (×4): 100 ug via INTRAVENOUS

## 2016-06-04 MED ORDER — ACETAMINOPHEN 10 MG/ML IV SOLN
INTRAVENOUS | Status: AC
  Filled 2016-06-04: qty 100

## 2016-06-04 MED ORDER — ENOXAPARIN SODIUM 40 MG/0.4ML ~~LOC~~ SOLN
40.0000 mg | SUBCUTANEOUS | Status: DC
  Administered 2016-06-05: 40 mg via SUBCUTANEOUS
  Filled 2016-06-04: qty 0.4

## 2016-06-04 MED ORDER — SUGAMMADEX SODIUM 200 MG/2ML IV SOLN
INTRAVENOUS | Status: DC | PRN
  Administered 2016-06-04: 181.4 mg via INTRAVENOUS

## 2016-06-04 MED ORDER — PANTOPRAZOLE SODIUM 40 MG PO TBEC
40.0000 mg | DELAYED_RELEASE_TABLET | Freq: Every day | ORAL | Status: DC
  Administered 2016-06-05: 40 mg via ORAL
  Filled 2016-06-04: qty 1

## 2016-06-04 MED ORDER — FENTANYL CITRATE (PF) 100 MCG/2ML IJ SOLN
INTRAMUSCULAR | Status: DC | PRN
  Administered 2016-06-04 (×2): 100 ug via INTRAVENOUS
  Administered 2016-06-04: 150 ug via INTRAVENOUS

## 2016-06-04 MED ORDER — LIDOCAINE 2% (20 MG/ML) 5 ML SYRINGE
INTRAMUSCULAR | Status: DC | PRN
  Administered 2016-06-04: 100 mg via INTRAVENOUS

## 2016-06-04 MED ORDER — PROMETHAZINE HCL 25 MG/ML IJ SOLN
12.5000 mg | INTRAMUSCULAR | Status: DC | PRN
  Filled 2016-06-04: qty 1

## 2016-06-04 MED ORDER — HYDROMORPHONE HCL 1 MG/ML IJ SOLN
INTRAMUSCULAR | Status: AC
  Filled 2016-06-04: qty 1

## 2016-06-04 MED ORDER — ONDANSETRON HCL 4 MG/2ML IJ SOLN
INTRAMUSCULAR | Status: DC | PRN
  Administered 2016-06-04: 4 mg via INTRAVENOUS

## 2016-06-04 MED ORDER — SODIUM CHLORIDE 0.9 % IV SOLN
INTRAVENOUS | Status: DC | PRN
  Administered 2016-06-04: 30 mL

## 2016-06-04 MED ORDER — LACTATED RINGERS IV SOLN
INTRAVENOUS | Status: DC
  Administered 2016-06-04 (×3): via INTRAVENOUS

## 2016-06-04 MED ORDER — MIDAZOLAM HCL 2 MG/2ML IJ SOLN
INTRAMUSCULAR | Status: DC | PRN
  Administered 2016-06-04: 2 mg via INTRAVENOUS

## 2016-06-04 MED ORDER — ACETAMINOPHEN 10 MG/ML IV SOLN
1000.0000 mg | Freq: Four times a day (QID) | INTRAVENOUS | Status: AC
  Administered 2016-06-04 – 2016-06-05 (×4): 1000 mg via INTRAVENOUS
  Filled 2016-06-04 (×4): qty 100

## 2016-06-04 MED ORDER — FENTANYL CITRATE (PF) 100 MCG/2ML IJ SOLN: 25.0000 ug | INTRAMUSCULAR | Status: DC | PRN

## 2016-06-04 MED ORDER — BUPIVACAINE-EPINEPHRINE 0.5% -1:200000 IJ SOLN
INTRAMUSCULAR | Status: DC | PRN
  Administered 2016-06-04: 30 mL

## 2016-06-04 MED ORDER — DEXMEDETOMIDINE HCL 200 MCG/2ML IV SOLN
INTRAVENOUS | Status: DC | PRN
  Administered 2016-06-04: 20 ug via INTRAVENOUS

## 2016-06-04 MED ORDER — HYDROMORPHONE HCL 1 MG/ML IJ SOLN
0.2500 mg | INTRAMUSCULAR | Status: DC | PRN
  Administered 2016-06-04 (×2): 0.5 mg via INTRAVENOUS

## 2016-06-04 MED ORDER — CEFAZOLIN SODIUM-DEXTROSE 2-4 GM/100ML-% IV SOLN
2.0000 g | INTRAVENOUS | Status: AC
  Administered 2016-06-04: 2 g via INTRAVENOUS

## 2016-06-04 SURGICAL SUPPLY — 51 items
APPLIER CLIP ROT 10 11.4 M/L (STAPLE) ×3
BLADE SURG 11 STRL SS SAFETY (MISCELLANEOUS) ×3 IMPLANT
CANISTER SUCT 1200ML W/VALVE (MISCELLANEOUS) ×3 IMPLANT
CANNULA DILATOR 10 W/SLV (CANNULA) ×3 IMPLANT
CANNULA DILATOR 5 W/SLV (CANNULA) ×6 IMPLANT
CATH CHOLANG 76X19 KUMAR (CATHETERS) ×3 IMPLANT
CHLORAPREP W/TINT 26ML (MISCELLANEOUS) ×3 IMPLANT
CLEANER CAUTERY TIP 5X5 PAD (MISCELLANEOUS) ×2 IMPLANT
CLIP APPLIE ROT 10 11.4 M/L (STAPLE) ×2 IMPLANT
CONRAY 60ML FOR OR (MISCELLANEOUS) ×3 IMPLANT
DISSECTOR KITTNER STICK (MISCELLANEOUS) ×2 IMPLANT
DISSECTORS/KITTNER STICK (MISCELLANEOUS) ×3
DRAPE SHEET LG 3/4 BI-LAMINATE (DRAPES) ×3 IMPLANT
DRESSING TELFA 4X3 1S ST N-ADH (GAUZE/BANDAGES/DRESSINGS) ×3 IMPLANT
DRSG TEGADERM 2-3/8X2-3/4 SM (GAUZE/BANDAGES/DRESSINGS) ×12 IMPLANT
ELECT EZSTD 165MM 6.5IN (MISCELLANEOUS) ×3
ELECT REM PT RETURN 9FT ADLT (ELECTROSURGICAL) ×3
ELECTRODE EZSTD 165MM 6.5IN (MISCELLANEOUS) ×2 IMPLANT
ELECTRODE REM PT RTRN 9FT ADLT (ELECTROSURGICAL) ×2 IMPLANT
ENDOPOUCH RETRIEVER 10 (MISCELLANEOUS) ×3 IMPLANT
GLOVE BIO SURGEON STRL SZ7.5 (GLOVE) ×21 IMPLANT
GLOVE INDICATOR 8.0 STRL GRN (GLOVE) ×18 IMPLANT
GOWN STRL REUS W/ TWL LRG LVL3 (GOWN DISPOSABLE) ×6 IMPLANT
GOWN STRL REUS W/TWL LRG LVL3 (GOWN DISPOSABLE) ×3
HEMOSTAT SURGICEL 2X14 (HEMOSTASIS) ×3 IMPLANT
IRRIGATION STRYKERFLOW (MISCELLANEOUS) ×2 IMPLANT
IRRIGATOR STRYKERFLOW (MISCELLANEOUS) ×3
IV LACTATED RINGERS 1000ML (IV SOLUTION) ×3 IMPLANT
KIT RM TURNOVER STRD PROC AR (KITS) ×3 IMPLANT
LABEL OR SOLS (LABEL) ×3 IMPLANT
NDL INSUFF ACCESS 14 VERSASTEP (NEEDLE) ×3 IMPLANT
NS IRRIG 500ML POUR BTL (IV SOLUTION) ×3 IMPLANT
PACK LAP CHOLECYSTECTOMY (MISCELLANEOUS) ×3 IMPLANT
PAD CLEANER CAUTERY TIP 5X5 (MISCELLANEOUS) ×1
SCISSORS METZENBAUM CVD 33 (INSTRUMENTS) ×3 IMPLANT
SEAL FOR SCOPE WARMER C3101 (MISCELLANEOUS) ×3 IMPLANT
SPONGE KITTNER 5P (MISCELLANEOUS) ×6 IMPLANT
SPONGE LAP 18X18 5 PK (GAUZE/BANDAGES/DRESSINGS) ×6 IMPLANT
STRIP CLOSURE SKIN 1/2X4 (GAUZE/BANDAGES/DRESSINGS) ×3 IMPLANT
SUT CHROMIC 3 0 SH 27 (SUTURE) ×3 IMPLANT
SUT MAXON ABS #0 GS21 30IN (SUTURE) ×3 IMPLANT
SUT SILK 2 0 (SUTURE) ×1
SUT SILK 2-0 30XBRD TIE 12 (SUTURE) ×2 IMPLANT
SUT VIC AB 0 CT1 36 (SUTURE) ×3 IMPLANT
SUT VIC AB 0 CT2 27 (SUTURE) ×3 IMPLANT
SUT VIC AB 4-0 FS2 27 (SUTURE) ×3 IMPLANT
SWABSTK COMLB BENZOIN TINCTURE (MISCELLANEOUS) ×3 IMPLANT
TROCAR XCEL NON-BLD 11X100MML (ENDOMECHANICALS) ×3 IMPLANT
TUBING INSUFFLATOR HI FLOW (MISCELLANEOUS) ×3 IMPLANT
VICRYL REEL 3-0 ×3 IMPLANT
WATER STERILE IRR 1000ML POUR (IV SOLUTION) ×3 IMPLANT

## 2016-06-04 NOTE — Anesthesia Preprocedure Evaluation (Signed)
Anesthesia Evaluation  Patient identified by MRN, date of birth, ID band Patient awake    Reviewed: Allergy & Precautions, H&P , NPO status , Patient's Chart, lab work & pertinent test results  History of Anesthesia Complications (+) PONV and history of anesthetic complications  Airway Mallampati: III  TM Distance: >3 FB Neck ROM: limited    Dental  (+) Poor Dentition   Pulmonary neg pulmonary ROS, former smoker,    Pulmonary exam normal breath sounds clear to auscultation       Cardiovascular Exercise Tolerance: Good hypertension, (-) Past MI Normal cardiovascular exam Rhythm:regular Rate:Normal     Neuro/Psych PSYCHIATRIC DISORDERS Depression negative neurological ROS     GI/Hepatic Neg liver ROS, GERD  ,  Endo/Other  Hypothyroidism   Renal/GU negative Renal ROS  negative genitourinary   Musculoskeletal  (+) Arthritis ,   Abdominal   Peds  Hematology negative hematology ROS (+)   Anesthesia Other Findings Past Medical History:   Hypertension                                                 Thyroid disease                                              Depression                                                   Arthritis                                                    History of peritonitis                          2002?          Comment:Right calf.   History of deep vein thrombosis                                Comment:Right calf   GERD (gastroesophageal reflux disease)                       PONV (postoperative nausea and vomiting)                    Past Surgical History:   Reconstruction of the right hand                 2006           Comment:surgery   APPENDECTOMY                                     2001           Comment:Laparoscopy, Dx-Peritontis   WRIST SURGERY  Left 1989           Comment:Dx-RA   KNEE SURGERY                                    Right               DIAGNOSTIC LAPAROSCOPY                                       BMI    Body Mass Index   32.29 kg/m 2      Reproductive/Obstetrics negative OB ROS                             Anesthesia Physical Anesthesia Plan  ASA: III  Anesthesia Plan: General ETT   Post-op Pain Management:    Induction:   Airway Management Planned:   Additional Equipment:   Intra-op Plan:   Post-operative Plan:   Informed Consent: I have reviewed the patients History and Physical, chart, labs and discussed the procedure including the risks, benefits and alternatives for the proposed anesthesia with the patient or authorized representative who has indicated his/her understanding and acceptance.   Dental Advisory Given  Plan Discussed with: Anesthesiologist, CRNA and Surgeon  Anesthesia Plan Comments:         Anesthesia Quick Evaluation

## 2016-06-04 NOTE — Anesthesia Procedure Notes (Signed)
Procedure Name: Intubation Date/Time: 06/04/2016 7:28 AM Performed by: Paulette Blanch Pre-anesthesia Checklist: Patient identified, Patient being monitored, Timeout performed, Emergency Drugs available and Suction available Patient Re-evaluated:Patient Re-evaluated prior to inductionOxygen Delivery Method: Circle system utilized Preoxygenation: Pre-oxygenation with 100% oxygen Intubation Type: IV induction Ventilation: Mask ventilation without difficulty Laryngoscope Size: 3 and Miller Grade View: Grade I Tube type: Oral Tube size: 7.5 mm Number of attempts: 1 Placement Confirmation: ETT inserted through vocal cords under direct vision,  positive ETCO2 and breath sounds checked- equal and bilateral Secured at: 21 cm Tube secured with: Tape Dental Injury: Teeth and Oropharynx as per pre-operative assessment

## 2016-06-04 NOTE — Brief Op Note (Signed)
06/04/2016  10:41 AM  PATIENT:  Angela Sherman  65 y.o. female  PRE-OPERATIVE DIAGNOSIS:  GALLSTONE  POST-OPERATIVE DIAGNOSIS:  GALLSTONE  PROCEDURE:  Procedure(s) with comments: LAPAROSCOPIC CHOLECYSTECTOMY WITH INTRAOPERATIVE CHOLANGIOGRAM (N/A) CHOLECYSTECTOMY - Laparoscopic converted to open  SURGEON:  Surgeon(s) and Role:    * Earline Mayotte, MD - Primary  PHYSICIAN ASSISTANT:   ASSISTANTS: none    ANESTHESIA:   general  EBL:  Total I/O In: 1500 [I.V.:1500] Out: 150 [Blood:150]  BLOOD ADMINISTERED:none  DRAINS: none   LOCAL MEDICATIONS USED:  MARCAINE     SPECIMEN:  Source of Specimen:  gallbladder  DISPOSITION OF SPECIMEN:  PATHOLOGY  COUNTS:  YES  TOURNIQUET:  * No tourniquets in log *  DICTATION: .Note written in EPIC  PLAN OF CARE: Admit to inpatient   PATIENT DISPOSITION:  PACU - hemodynamically stable.   Delay start of Pharmacological VTE agent (>24hrs) due to surgical blood loss or risk of bleeding: yes

## 2016-06-04 NOTE — H&P (Signed)
No change in clinical history or exam.  

## 2016-06-04 NOTE — Discharge Instructions (Signed)
Bilirubin Test °WHY AM I HAVING THIS TEST? °The bilirubin test is used to evaluate liver function. Your health care provider may recommend this test if you have hemolytic anemia. The test may also be done for a newborn who has jaundice. °Bilirubin is produced when red blood cells are broken down. Normally, bilirubin is broken down in the liver and excreted as a component of bile. However, when red blood cells are broken down more quickly than usual, or when there is a dysfunction in how bile is excreted, bilirubin levels can become elevated. In newborns with jaundice, elevated bilirubin levels may put the child at risk for brain damage. °WHAT KIND OF SAMPLE IS TAKEN? °This test can be performed using one of the following methods: °· Blood sample. This is usually collected by inserting a needle into a vein. °· Urine sample. This is collected using a sterile container that is given to you by the lab. °HOW DO I PREPARE FOR THE TEST? °Fasting requirements for this test may vary among different labs. You may be asked not to eat or drink anything except water after midnight on the night before the test. °WHAT ARE THE REFERENCE RANGES? °Reference ranges are considered healthy ranges established after testing a large group of healthy people. Reference ranges may vary among different people, labs, and hospitals. It is your responsibility to obtain your test results. Ask the lab or department performing the test when and how you will get your results.  °Reference ranges for blood samples: °· Adult, child, or elderly: °¨ Total bilirubin: 0.3-1 mg/dL or 5.1-17 micromole/liter (SI units). °¨ Indirect bilirubin: 0.2-0.8 mg/dL or 3.4-12 micromole/liter (SI units). °¨ Direct bilirubin: 0.1-0.3 mg/dL or 1.7-5.1 micromole/liter (SI units). °· Newborn total bilirubin: 1-12 mg/dL or 17.1-205 micromole/liter (SI units). °Reference range for urine samples: °0-0.02 mg/dL.  °WHAT DO THE RESULTS MEAN? °Levels greater than the reference  ranges may indicate: °· Gallstones. °· Obstruction of the bile ducts. °· Certain tumors of the liver. °· Disorders that affect the breakdown and excretion of bilirubin. °· Disorders that cause the destruction of red blood cells. °· Liver diseases. °· Reaction to certain medicines. °· Reaction to blood transfusion. °Talk with your health care provider to discuss your results, treatment options, and if necessary, the need for more tests. Talk with your health care provider if you have any questions about your results. °  °This information is not intended to replace advice given to you by your health care provider. Make sure you discuss any questions you have with your health care provider. °  °Document Released: 01/07/2005 Document Revised: 01/06/2015 Document Reviewed: 05/16/2014 °Elsevier Interactive Patient Education ©2016 Elsevier Inc. ° °

## 2016-06-04 NOTE — Anesthesia Postprocedure Evaluation (Signed)
Anesthesia Post Note  Patient: Angela Sherman  Procedure(s) Performed: Procedure(s) (LRB): LAPAROSCOPIC CHOLECYSTECTOMY WITH INTRAOPERATIVE CHOLANGIOGRAM (N/A) CHOLECYSTECTOMY  Patient location during evaluation: PACU Anesthesia Type: General Level of consciousness: awake and alert Pain management: pain level controlled Vital Signs Assessment: post-procedure vital signs reviewed and stable Respiratory status: spontaneous breathing, nonlabored ventilation, respiratory function stable and patient connected to nasal cannula oxygen Cardiovascular status: blood pressure returned to baseline and stable Postop Assessment: no signs of nausea or vomiting Anesthetic complications: no    Last Vitals:  Filed Vitals:   06/04/16 1105 06/04/16 1134  BP:  115/72  Pulse:  102  Temp: 36.3 C 36.7 C  Resp:  16    Last Pain:  Filed Vitals:   06/04/16 1141  PainSc: 4                  Jomarie Longs K Tamu Golz

## 2016-06-04 NOTE — Transfer of Care (Signed)
Immediate Anesthesia Transfer of Care Note  Patient: Angela Sherman  Procedure(s) Performed: Procedure(s) with comments: LAPAROSCOPIC CHOLECYSTECTOMY WITH INTRAOPERATIVE CHOLANGIOGRAM (N/A) CHOLECYSTECTOMY - Laparoscopic converted to open  Patient Location: PACU  Anesthesia Type:General  Level of Consciousness: awake, alert  and oriented  Airway & Oxygen Therapy: Patient Spontanous Breathing and Patient connected to face mask oxygen  Post-op Assessment: Report given to RN and Post -op Vital signs reviewed and stable  Post vital signs: Reviewed and stable  Last Vitals:  Filed Vitals:   06/04/16 0611 06/04/16 0958  BP: 124/99 120/71  Pulse: 117   Temp: 36.9 C 36.2 C  Resp: 16     Last Pain:  Filed Vitals:   06/04/16 0959  PainSc: 5          Complications: No apparent anesthesia complications

## 2016-06-04 NOTE — Op Note (Signed)
Preoperative diagnosis: Chronic cholecystitis and cholelithiasis.  Postoperative diagnosis same with extensive intra-abdominal adhesions.  Operative procedure laparoscopy, open cholecystectomy with intraoperative Clevenger grams.  Operating surgeon: Lane Hacker, M.D.  Anesthesia: Gen. endotracheal. Marcaine 0.5% with 1-200,000 epinephrine, 30 mL as field block at the end of the procedure.  Estimated blood loss 150 mL  Fluid replacement: 1500 mL crystalloid.  Clinical note: This 65 year old steroid dependent woman has had recurrent episodes of abdominal pain and one episode of chalky stools suggestive of a passed common bile duct stone. Ultrasound showed multiple large stones. Normal common bile duct diameter. She was felt to be a candidate for elective cholecystectomy. History is notable for an abdominal exploration in 2001 for peritonitis. No source was identified but during the procedure the right colon was mobilized as well as a Recruitment consultant of the duodenum completed. Most of the inflammation was reported in the area of the portal triad. She received Kefzol intravenously. SCD stockings were used for DVT prevention as well as heparin, 5000 units subcutaneous prior to surgery due to a past history of DVT.   Operative note: The patient received appropriate steroid supplementation on the induction of anesthesia. The abdomen was prepped with ChloraPrep and draped. An Trendelenburg position a Veress needle was placed with an umbilical incision which was not involved in her upper abdominal incision. After assuring intra-abdominal location with the hanging drop test the abdomen was insufflated with CO2 a 10 mmHg pressure. A 10 mm step port was expanded. Inspection showed no evidence of injury from initial port placement. There was however an extensive band of occasions running from the right lower quadrant up to the midline. It was possible by moving the laparoscope to the left lower quadrant and  left upper quadrant to in part visualized the epigastric area. Attempts to place the 11 mm port in this area to dissect the adhesions was unsuccessful. Small amount of bleeding from a traumatic injury to the rectus muscle on it stops spontaneously. At this point it was elected to abort the laparoscopic procedure.  The abdomen was opened with a subcostal incision 2 fingerbreadth below the costal margin. The skin was incised sharply and the remaining dissection completed with electrocautery including the rectus muscle. The posterior rectus sheath was opened. Extensive adhesions to the intra-abdominal wall were appreciated. The incision was extended just past midline to obtain a clear space to the abdomen. The stomach was dissected free from the anterior abdominal wall followed by the duodenum. The anterior and superior surface of the liver was free. The gallbladder was identified. With peanut dissectors the gallbladder was separated from the colon below it. Minimal acute inflammatory changes noted. With appropriate packing and making use of a Thompson retractor the neck of the gallbladder was exposed. The cystic duct was looped with a 2-0 silk tie. Fluoroscopic Lander grams were completed using 15 mL of one half strength Conray 60. This showed prompt filling of the right and left hepatic ducts and free flow into the duodenum. The cystic duct was then doubly clipped. The cystic vein was quite prominent measuring about 4 mm in diameter. This was doubly clipped. The cystic artery was treated in similar fashion. The gallbladder is removed from the liver bed making use of cautery dissection. Bleeding from the gallbladder bed near the fundus was controlled initially with Surgicel and a single 3-0 chromic figure-of-eight suture. A second layer Surgicel was applied at the end of the procedure. Good hemostasis was noted.   The abdomen was irrigated.  Sponge tape and needle count was correct. Cystic duct was sealed  appropriately.  No bleeding from the cystic artery or cystic vein noted.  The posterior rectus sheath was closed with a running 0 Vicryl. The midline fascia was approximated with a 0 Maxon figure-of-eight suture. The anterior rectus sheath was closed with a running 0 Maxon suture. The adipose layer was approximated with a running 2-0 Vicryl suture. Skin was closed with staples. The 11 mm port site in the epigastrium was closed with staples and the umbilical incision closed with a 4-0 Vicryl septic suture. Telfa and Tegaderm dressings were applied.  The patient tolerated the procedure well and was taken the recovery room in stable condition.

## 2016-06-04 NOTE — Progress Notes (Signed)
Received pt from PACU to Rm 228. Pt AOx4. VSS. No signs of acute distress. IV access intact. Medications administered (See MAR). Admission completed. Assessment completed. DVT prophylaxis assessed and completed.   Education provided on call bell, bed alarm, telephone and IV/IV Pole/IV Alarms. 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 White Board was completed and updated PRN.   Dietary specification confirmed.   Family at bedside. Pt resting comfortably and quietly w/bed in low and locked position and call bell and telephone within reach. Will continue to monitor.

## 2016-06-05 ENCOUNTER — Encounter: Payer: Self-pay | Admitting: General Surgery

## 2016-06-05 LAB — SURGICAL PATHOLOGY

## 2016-06-05 NOTE — Progress Notes (Signed)
AVSS. Minimal pain. Using incentive at 1500. Marked cough after Inspirex. Using pillow to splint. ABD: Soft. BS+. Dressings: scant drainage. Lungs: Clear. WBC yesterday 16K after steroid bolus prior to procedure. Appetite down today. Will plan discharge for tomorrow. Desires to stay at liquid diet today.

## 2016-06-06 ENCOUNTER — Other Ambulatory Visit: Payer: Self-pay | Admitting: Family Medicine

## 2016-06-06 NOTE — Progress Notes (Signed)
Slept well; no prn's given overnight; for soft diet and discharge today. Windy Carina, RN 6:46 AM 06/06/2016

## 2016-06-06 NOTE — Progress Notes (Signed)
Patient discharged to home. IV discontinued site clean and dry. Patient friend at the bedside. No acute distress noted. Prescriptions given as ordered. Follow up appointment given as ordered

## 2016-06-06 NOTE — Final Progress Note (Signed)
Pt with no complaints. VSS. Says she uses tylenol for pain- does need any thing stronger. Abdomen is soft. Incision clean. Lungs clear. OK to discharge

## 2016-06-11 NOTE — Discharge Summary (Signed)
Physician Discharge Summary  Patient ID: Angela Sherman MRN: [id number] DOB/AGE: [birth year] 66 y.o.  Admit date: 06/04/2016 Discharge date: 06/11/2016  Admission Diagnoses: Chronic cholecystitis and cholelithiasis.  Discharge Diagnoses:  Active Problems:   Cholecystitis with cholelithiasis   Discharged Condition: good  Hospital Course: The patient was omitted after open cholecystectomy. She ambulated the afternoon of surgeries. She was begun on clear liquids and advance to soft diet. She tolerated this well. Mild cytosis on day of surgery secondary to steroid bolus prior to the procedure.  Consults: None  Significant Diagnostic Studies: radiology: normal cholangiograms  Treatments: IV hydration  Discharge Exam: Blood pressure 145/66, pulse 72, temperature 98.2 F (36.8 C), temperature source Oral, resp. rate 24, height 5\' 6"  (1.676 m), weight 201 lb 4.8 oz (91.309 kg), SpO2 97 %. Clear cardiopulmonary exam. Soft abdomen. Wound healing well.  Disposition: 01-Home or Self Care  Discharge Instructions    Call MD for:  persistant nausea and vomiting    Complete by:  As directed      Call MD for:  redness, tenderness, or signs of infection (pain, swelling, redness, odor or green/yellow discharge around incision site)    Complete by:  As directed      Call MD for:  severe uncontrolled pain    Complete by:  As directed      Call MD for:  temperature >100.4    Complete by:  As directed      Diet - low sodium heart healthy    Complete by:  As directed      Discharge instructions    Complete by:  As directed   No exertional activity. May shower. Remove dressings in 2-3 days.            Medication List    TAKE these medications        cholecalciferol 1000 units tablet  Commonly known as:  VITAMIN D  Take 1,000 Units by mouth daily.     DULoxetine 30 MG capsule  Commonly known as:  CYMBALTA  TAKE 1 CAPSULE BY MOUTH EVERY DAY     KRILL OIL PO  Take 500 mg by mouth  daily.     levothyroxine 75 MCG tablet  Commonly known as:  SYNTHROID, LEVOTHROID  TAKE 1 TABLET EVERY DAY     omeprazole 20 MG capsule  Commonly known as:  PRILOSEC  Take 1 capsule (20 mg total) by mouth daily.     ondansetron 4 MG disintegrating tablet  Commonly known as:  ZOFRAN-ODT  Take 4 mg by mouth every 8 (eight) hours as needed for nausea or vomiting.     predniSONE 5 MG tablet  Commonly known as:  DELTASONE  Take 5 mg by mouth daily with breakfast.     triamterene-hydrochlorothiazide 37.5-25 MG tablet  Commonly known as:  MAXZIDE-25  Take 1 tablet by mouth daily. Reported on 04/09/2016           Follow-up Information    Follow up with 06/09/2016, MD. Go on 06/12/2016.   Specialties:  General Surgery, Radiology   Why:  Wednesday at 11:00am for hospital follow-up.   Contact information:   [address] [place] [phone]       Signed: [phone] 06/11/2016, 9:47 AM

## 2016-06-12 ENCOUNTER — Encounter: Payer: Self-pay | Admitting: General Surgery

## 2016-06-12 ENCOUNTER — Ambulatory Visit (INDEPENDENT_AMBULATORY_CARE_PROVIDER_SITE_OTHER): Payer: Medicare PPO | Admitting: General Surgery

## 2016-06-12 VITALS — BP 132/80 | HR 76 | Resp 14 | Ht 66.0 in | Wt 199.0 lb

## 2016-06-12 DIAGNOSIS — K8020 Calculus of gallbladder without cholecystitis without obstruction: Secondary | ICD-10-CM

## 2016-06-12 NOTE — Patient Instructions (Addendum)
Patient to return in one month. 

## 2016-06-12 NOTE — Progress Notes (Signed)
Patient ID: Angela Sherman, female   DOB: [birth year], 65 y.o.   MRN: [id number]  Chief Complaint  Patient presents with  . Routine Post Op    gallbladder surgery    HPI Angela Sherman is a 65 y.o. female here today for her post op cholecystectomy done on 06/04/2016. Patient states she is doing well. Moving her bowels daily.Sister Daryll Brod was present with patient.   The patient underwent laparoscopy, and with the extensive adhesions from her 2001 exploration the procedure was converted to an open cholecystectomy with intraoperative cholangiograms.  The patient was ambulating around the nurse's station the night of surgery. She is able be discharged home on postoperative day 2.  The patient reports that she has discontinued her omeprazole therapy. No increase in stool frequency postcholecystectomy.  I person reviewed the patient's history. HPI  Past Medical History  Diagnosis Date  . Hypertension   . Thyroid disease   . Depression   . Arthritis   . History of peritonitis 2002?    Right calf.  . History of deep vein thrombosis     Right calf  . GERD (gastroesophageal reflux disease)   . PONV (postoperative nausea and vomiting)     Past Surgical History  Procedure Laterality Date  . Reconstruction of the right hand  2006    surgery  . Appendectomy  2001    Laparoscopy, Dx-Peritontis  . Wrist surgery Left 1989    Dx-RA  . Knee surgery Right   . Diagnostic laparoscopy    . Cholecystectomy N/A 06/04/2016    Procedure: LAPAROSCOPIC CHOLECYSTECTOMY WITH INTRAOPERATIVE CHOLANGIOGRAM;  Surgeon: Earline Mayotte, MD;  Location: [facility] ORS;  Service: General;  Laterality: N/A;  . Cholecystectomy  06/04/2016    Procedure: CHOLECYSTECTOMY;  Surgeon: Earline Mayotte, MD;  Location: [facility] ORS;  Service: General;;  Laparoscopic converted to open    Family History  Problem Relation Age of Onset  . Hypertension Brother     Social History Social History  Substance Use Topics  . Smoking  status: Former Smoker    Types: Cigarettes  . Smokeless tobacco: Never Used  . Alcohol Use: Yes     Comment: Occasional    Allergies  Allergen Reactions  . Remicade [Infliximab] Anaphylaxis  . Ciprofloxacin   . Demerol [Meperidine] Nausea And Vomiting  . Nsaids   . Aspirin Rash  . Codeine Nausea And Vomiting    Severe vomiting  . Clinoril [Sulindac] Rash  . Methotrexate Derivatives Rash and Cough  . Penicillins Rash  . Shellfish Allergy Swelling and Rash    Current Outpatient Prescriptions  Medication Sig Dispense Refill  . cholecalciferol (VITAMIN D) 1000 units tablet Take 1,000 Units by mouth daily.     . DULoxetine (CYMBALTA) 30 MG capsule TAKE 1 CAPSULE BY MOUTH EVERY DAY 30 capsule 12  . KRILL OIL PO Take 500 mg by mouth daily.     [name] levothyroxine (SYNTHROID, LEVOTHROID) 75 MCG tablet TAKE 1 TABLET EVERY DAY 90 tablet 3  . omeprazole (PRILOSEC) 20 MG capsule Take 1 capsule (20 mg total) by mouth daily. 30 capsule 3  . ondansetron (ZOFRAN-ODT) 4 MG disintegrating tablet Take 4 mg by mouth every 8 (eight) hours as needed for nausea or vomiting.    . predniSONE (DELTASONE) 5 MG tablet Take 5 mg by mouth daily with breakfast.    . triamterene-hydrochlorothiazide (MAXZIDE-25) 37.5-25 MG tablet Take 1 tablet by mouth daily. Reported on 04/09/2016 90 tablet 3   No  current facility-administered medications for this visit.    Review of Systems Review of Systems  Constitutional: Negative.   Respiratory: Negative.   Cardiovascular: Negative.     Blood pressure 132/80, pulse 76, resp. rate 14, height 5\' 6"  (1.676 m), weight 199 lb (90.266 kg).  Physical Exam Physical Exam  Constitutional: She is oriented to person, place, and time. She appears well-developed and well-nourished.  Eyes: Conjunctivae are normal. No scleral icterus.  Neck: Neck supple.  Cardiovascular: Normal rate, regular rhythm and normal heart sounds.   Pulmonary/Chest: Effort normal and breath sounds  normal.  Abdominal: Soft. Normal appearance and bowel sounds are normal. There is no hepatomegaly. There is no tenderness.    Incision are clean and healing well. The staples were removed and steri strips applied.  Lymphadenopathy:    She has no cervical adenopathy.  Neurological: She is alert and oriented to person, place, and time.  Skin: Skin is warm and dry.    Data Reviewed DIAGNOSIS:  A. GALLBLADDER; CHOLECYSTECTOMY:  - CHOLELITHIASIS AND CHRONIC CHOLECYSTITIS.  - BENIGN LYMPH NODE.   Assessment    Doing well status post open cholecystectomy.    Plan    The patient will increase her activities as tolerated. Proper lifting technique reviewed.   Patient to return in one month.  PCP:   This information has been scribed by Sullivan Lone CMA.    Ples Specter 06/12/2016, 12:20 PM

## 2016-06-18 ENCOUNTER — Ambulatory Visit (INDEPENDENT_AMBULATORY_CARE_PROVIDER_SITE_OTHER): Payer: Medicare PPO | Admitting: Family Medicine

## 2016-06-18 VITALS — BP 126/82 | HR 68 | Temp 97.6°F | Resp 16 | Wt 199.0 lb

## 2016-06-18 DIAGNOSIS — B349 Viral infection, unspecified: Secondary | ICD-10-CM

## 2016-06-18 NOTE — Progress Notes (Signed)
Angela Sherman  MRN: [id number] DOB: 1951/03/05  Subjective:  HPI   The patient is a 65 year old female who presents for evaluation of sore throat and ear pain.  It is of note that the patient has surgery on 06/04/16 and was subsequently admitted into the hospital until 06/06/16  She developed sore throat and ear pain on Friday, 4 days ago.  She states she is doing better now and no longer has sore throat and only has fullness in her ears.    Patient Active Problem List   Diagnosis Date Noted  . Cholecystitis with cholelithiasis 06/04/2016  . Gallstones without obstruction of gallbladder 05/21/2016  . Rheumatoid arthritis of wrist (HCC) 05/21/2016  . Hypercholesteremia 04/09/2016  . Avitaminosis D 04/09/2016  . 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 Vitamin B deficiency 07/12/2010  . Anxiety, generalized 06/29/2010  . Essential (primary) hypertension 05/02/2008  . Adult hypothyroidism 02/10/2008  . Clinical depression 03/17/2007  . Allergic rhinitis 11/04/2005  . Arthritis or polyarthritis, rheumatoid (HCC) 11/04/2005  . Herpes 11/04/2005    Past Medical History  Diagnosis Date  . Hypertension   . Thyroid disease   . Depression   . Arthritis   . History of peritonitis 2002?    Right calf.  . History of deep vein thrombosis     Right calf  . GERD (gastroesophageal reflux disease)   . PONV (postoperative nausea and vomiting)     Social History   Social History  . Marital Status: Single    Spouse Name: N/A  . Number of Children: N/A  . Years of Education: N/A   Occupational History  . Not on file.   Social History Main Topics  . Smoking status: Former Smoker    Types: Cigarettes  . Smokeless tobacco: Never Used  . Alcohol Use: Yes     Comment: Occasional  . Drug Use: No  . Sexual Activity: No   Other Topics Concern  . Not on file   Social History Narrative    Outpatient Prescriptions Prior to Visit  Medication Sig Dispense Refill  . cholecalciferol (VITAMIN  D) 1000 units tablet Take 1,000 Units by mouth daily.     . DULoxetine (CYMBALTA) 30 MG capsule TAKE 1 CAPSULE BY MOUTH EVERY DAY 30 capsule 12  . KRILL OIL PO Take 500 mg by mouth daily.     [name] levothyroxine (SYNTHROID, LEVOTHROID) 75 MCG tablet TAKE 1 TABLET EVERY DAY 90 tablet 3  . omeprazole (PRILOSEC) 20 MG capsule Take 1 capsule (20 mg total) by mouth daily. 30 capsule 3  . predniSONE (DELTASONE) 5 MG tablet Take 5 mg by mouth daily with breakfast.    . triamterene-hydrochlorothiazide (MAXZIDE-25) 37.5-25 MG tablet Take 1 tablet by mouth daily. Reported on 04/09/2016 90 tablet 3  . ondansetron (ZOFRAN-ODT) 4 MG disintegrating tablet Take 4 mg by mouth every 8 (eight) hours as needed for nausea or vomiting.     No facility-administered medications prior to visit.    Allergies  Allergen Reactions  . Remicade [Infliximab] Anaphylaxis  . Ciprofloxacin   . Demerol [Meperidine] Nausea And Vomiting  . Nsaids   . Aspirin Rash  . Codeine Nausea And Vomiting    Severe vomiting  . Clinoril [Sulindac] Rash  . Methotrexate Derivatives Rash and Cough  . Penicillins Rash  . Shellfish Allergy Swelling and Rash    Review of Systems  Constitutional: Negative for fever and malaise/fatigue.  HENT: Negative for congestion, ear discharge, ear pain, hearing  loss, nosebleeds, sore throat (None since yesterday) and tinnitus.   Eyes: Negative for blurred vision, double vision, photophobia, pain, discharge and redness.  Respiratory: Positive for cough, sputum production, shortness of breath and wheezing.        All are chronic and unchanged  Cardiovascular: Negative for chest pain, palpitations, orthopnea and leg swelling.  Gastrointestinal: Negative.   Neurological: Negative for weakness and headaches.  Endo/Heme/Allergies: Negative.   Psychiatric/Behavioral: Negative.    Objective:  BP 126/82 mmHg  Pulse 68  Temp(Src) 97.6 F (36.4 C) (Oral)  Resp 16  Wt 199 lb (90.266 kg)  Physical Exam    Constitutional: She is oriented to person, place, and time and well-developed, well-nourished, and in no distress.  HENT:  Head: Normocephalic and atraumatic.  Right Ear: External ear normal.  Left Ear: External ear normal.  Nose: Nose normal.  Mouth/Throat: Oropharynx is clear and moist.  Cobblestoning of the throat  Eyes: Conjunctivae and EOM are normal. Pupils are equal, round, and reactive to light.  Neck: Normal range of motion. Neck supple.  Cardiovascular: Normal rate, regular rhythm and normal heart sounds.   Pulmonary/Chest: Effort normal and breath sounds normal.  Abdominal: Soft.  Neurological: She is alert and oriented to person, place, and time. Gait normal.  Skin: Skin is warm and dry.  Psychiatric: Mood, memory, affect and judgment normal.    Assessment and Plan :   1. Viral syndrome Fluids, rest and will write for antibiotic if needed later. 2. Rheumatoid arthritis Patient was seen and examined by Dr. Gerlene Burdock L. Wendelyn Breslow and the note was scribed by Janey Greaser, RMA. I have done the exam and reviewed the above chart and it is accurate to the best of my knowledge.  [name] MD [facility] 06/18/2016 2:55 PM

## 2016-07-11 ENCOUNTER — Encounter: Payer: Self-pay | Admitting: General Surgery

## 2016-07-11 ENCOUNTER — Ambulatory Visit (INDEPENDENT_AMBULATORY_CARE_PROVIDER_SITE_OTHER): Payer: Medicare PPO | Admitting: General Surgery

## 2016-07-11 VITALS — BP 128/72 | HR 74 | Resp 14 | Ht 66.0 in | Wt 201.0 lb

## 2016-07-11 DIAGNOSIS — K8020 Calculus of gallbladder without cholecystitis without obstruction: Secondary | ICD-10-CM

## 2016-07-11 NOTE — Progress Notes (Signed)
Patient ID: Angela Sherman, female   DOB: 1951/11/26, 65 y.o.   MRN: [id number]  Chief Complaint  Patient presents with  . Follow-up    HPI Angela Sherman is a 65 y.o. female here today for her follow up from her cholecystectomy done on 06/04/16. Patient states she is doing well.   The a she reports she is back to baseline.  I personally reviewed the patient's history. HPI  Past Medical History  Diagnosis Date  . Hypertension   . Thyroid disease   . Depression   . Arthritis   . History of peritonitis 2002?    Right calf.  . History of deep vein thrombosis     Right calf  . GERD (gastroesophageal reflux disease)   . PONV (postoperative nausea and vomiting)     Past Surgical History  Procedure Laterality Date  . Reconstruction of the right hand  2006    surgery  . Appendectomy  2001    Laparoscopy, Dx-Peritontis  . Wrist surgery Left 1989    Dx-RA  . Knee surgery Right   . Diagnostic laparoscopy    . Cholecystectomy N/A 06/04/2016    Procedure: LAPAROSCOPIC CHOLECYSTECTOMY WITH INTRAOPERATIVE CHOLANGIOGRAM;  Surgeon: Earline Mayotte, MD;  Location: [facility] ORS;  Service: General;  Laterality: N/A;  . Cholecystectomy  06/04/2016    Procedure: CHOLECYSTECTOMY;  Surgeon: Earline Mayotte, MD;  Location: [facility] ORS;  Service: General;;  Laparoscopic converted to open    Family History  Problem Relation Age of Onset  . Hypertension Brother     Social History Social History  Substance Use Topics  . Smoking status: Former Smoker    Types: Cigarettes  . Smokeless tobacco: Never Used  . Alcohol Use: Yes     Comment: Occasional    Allergies  Allergen Reactions  . Remicade [Infliximab] Anaphylaxis  . Ciprofloxacin   . Demerol [Meperidine] Nausea And Vomiting  . Nsaids   . Aspirin Rash  . Codeine Nausea And Vomiting    Severe vomiting  . Clinoril [Sulindac] Rash  . Methotrexate Derivatives Rash and Cough  . Penicillins Rash  . Shellfish Allergy Swelling and Rash     Current Outpatient Prescriptions  Medication Sig Dispense Refill  . cholecalciferol (VITAMIN D) 1000 units tablet Take 1,000 Units by mouth daily.     . DULoxetine (CYMBALTA) 30 MG capsule TAKE 1 CAPSULE BY MOUTH EVERY DAY 30 capsule 12  . KRILL OIL PO Take 500 mg by mouth daily.     [name] levothyroxine (SYNTHROID, LEVOTHROID) 75 MCG tablet TAKE 1 TABLET EVERY DAY 90 tablet 3  . omeprazole (PRILOSEC) 20 MG capsule Take 1 capsule (20 mg total) by mouth daily. 30 capsule 3  . predniSONE (DELTASONE) 5 MG tablet Take 5 mg by mouth daily with breakfast.    . triamterene-hydrochlorothiazide (MAXZIDE-25) 37.5-25 MG tablet Take 1 tablet by mouth daily. Reported on 04/09/2016 90 tablet 3   No current facility-administered medications for this visit.    Review of Systems Review of Systems  Constitutional: Negative.   Respiratory: Negative.   Cardiovascular: Negative.     Blood pressure 128/72, pulse 74, resp. rate 14, height 5\' 6"  (1.676 m), weight 201 lb (91.173 kg).  Physical Exam Physical Exam  Constitutional: She is oriented to person, place, and time. She appears well-developed and well-nourished.  HENT:  Mouth/Throat: Oropharynx is clear and moist.  Eyes: Conjunctivae are normal. No scleral icterus.  Neck: Neck supple.  Cardiovascular: Normal rate, regular rhythm  and normal heart sounds.   Pulmonary/Chest: Effort normal and breath sounds normal.  Abdominal: Soft. Normal appearance and bowel sounds are normal.  Port sites well healed.  Lymphadenopathy:    She has no cervical adenopathy.  Neurological: She is alert and oriented to person, place, and time.  Skin: Skin is warm and dry.  Psychiatric: Her behavior is normal.    Data Reviewed No past history of colonoscopy.  Assessment    Doing well status post open cholecystectomy.  No previous colonoscopy.     Plan    Patient reported that she felt she was at high risk for perforation due to her ongoing prednisone therapy  were she consider colonoscopy. I encouraged her to consider having the Cologuard testing completed. If this was negative, it would be reassurance that an occult malignancy is not overlooked (which could present with obstruction difficult to manage in the face of ongoing steroids). If her Cologuard test was positive, could give strong thought to a CT colonoscopy prior to formal colonoscopy.  This time the patient is amenable to completing Cologuard testing.     Patient to return as needed.  She wishes not to have a colonoscopy. Discussed Cologuard option, order faxed.  PCP:  [name] This information has been scribed by Ples Specter CMA.   Earline Mayotte 07/11/2016, 10:08 PM

## 2016-07-11 NOTE — Patient Instructions (Addendum)
Patient to return as needed. cologuard testing

## 2016-10-28 ENCOUNTER — Other Ambulatory Visit: Payer: Self-pay | Admitting: Family Medicine

## 2016-10-28 DIAGNOSIS — K219 Gastro-esophageal reflux disease without esophagitis: Secondary | ICD-10-CM

## 2016-11-12 ENCOUNTER — Other Ambulatory Visit: Payer: Self-pay | Admitting: Family Medicine

## 2016-11-12 MED ORDER — LEVOTHYROXINE SODIUM 75 MCG PO TABS: 75.0000 ug | ORAL_TABLET | Freq: Every day | ORAL | 0 refills | Status: DC

## 2016-11-12 NOTE — Telephone Encounter (Signed)
Pt contacted office for refill request on the following medications: levothyroxine (SYNTHROID, LEVOTHROID) 75 MCG tablet Last written: 12/13/15 90 day supply with 3 refills  Pt stated that she didn't realize she didn't have any refills on the medication until the mail order pharmacy didn't send her the medication. Pt stated she is out of the medication and wanted to see if she could get samples to last until [facility] could get the new Rx or if a 10 day supply could be sent to [facility] and the 90 day supply sent to [facility]. Please advise. Thanks TNP

## 2016-11-12 NOTE — Telephone Encounter (Signed)
LMTCB Med sent but pt has not been in for labs in a while and will need labs (unless she's had them else where)

## 2016-11-14 NOTE — Telephone Encounter (Signed)
Spoke with patient and she states she is going to see rheumatologist in January and will get them to order this level. Patient thought all of this was done in April when she had labs done, explained to patient the last 2 and actually last few visits only been for when she was sick and not for routine check up so labs that were ordered were just specific to her illnesses. Last routine check up was in 2015. Explained all of this to the patient-aa

## 2016-11-14 NOTE — Telephone Encounter (Signed)
Ok for what she needs but would get her in in December to make sure she has not missed any significant health maintenance issues. Can be with Angela Sherman if she wishes it in  next week or 2.

## 2016-11-15 NOTE — Telephone Encounter (Signed)
Left detailed message for patient stating below. KW °

## 2016-11-20 ENCOUNTER — Other Ambulatory Visit: Payer: Self-pay | Admitting: Family Medicine

## 2016-11-26 ENCOUNTER — Telehealth: Payer: Self-pay | Admitting: Family Medicine

## 2016-12-03 ENCOUNTER — Ambulatory Visit (INDEPENDENT_AMBULATORY_CARE_PROVIDER_SITE_OTHER): Payer: Medicare PPO | Admitting: Family Medicine

## 2016-12-03 ENCOUNTER — Encounter: Payer: Self-pay | Admitting: Family Medicine

## 2016-12-03 VITALS — BP 126/80 | HR 78 | Temp 97.8°F | Resp 16 | Wt 203.0 lb

## 2016-12-03 DIAGNOSIS — I10 Essential (primary) hypertension: Secondary | ICD-10-CM

## 2016-12-03 DIAGNOSIS — E039 Hypothyroidism, unspecified: Secondary | ICD-10-CM | POA: Diagnosis not present

## 2016-12-03 DIAGNOSIS — Z23 Encounter for immunization: Secondary | ICD-10-CM | POA: Diagnosis not present

## 2016-12-03 NOTE — Progress Notes (Signed)
Subjective:  HPI Pt is here for a follow up of her thyroid and HTN. She has not been seen for these problems in more than a year. She has been seen for acute problems, but not for chronic problems. She reports that she is feeling well. She has not had labs (or TSH) since 2015.  She had hand surgery in October 2017 on her left hand, she slammed this hand in a dresser door. It is painful and swollen. She reports that she slammed it hard and it hurt so bad it kind of  took her breath away and she had to lay on the bed.   Declines Flu vaccine.   Pt would like to discuss her immune system and her visiting a friend in the nursing home and her likelihood of getting infections etc there.   Prior to Admission medications   Medication Sig Start Date End Date Taking? Authorizing Provider  acetaminophen (TYLENOL) 500 MG tablet Take 1,000 mg by mouth. 10/02/16 10/02/17 Yes Historical Provider, MD  cholecalciferol (VITAMIN D) 1000 units tablet Take 1,000 Units by mouth daily.    Yes Historical Provider, MD  DULoxetine (CYMBALTA) 30 MG capsule TAKE 1 CAPSULE BY MOUTH EVERY DAY 06/06/16  Yes Keyra Virella Hulen Shouts., MD  levothyroxine (SYNTHROID, LEVOTHROID) 75 MCG tablet TAKE 1 TABLET BY MOUTH EVERY DAY 11/13/16  Yes Maple Hudson., MD  omeprazole (PRILOSEC) 20 MG capsule TAKE 1 CAPSULE(20 MG) BY MOUTH DAILY 10/28/16  Yes Maple Hudson., MD  predniSONE (DELTASONE) 5 MG tablet Take 5 mg by mouth daily with breakfast.   Yes Historical Provider, MD  KRILL OIL PO Take 500 mg by mouth daily.     Historical Provider, MD  triamterene-hydrochlorothiazide (MAXZIDE-25) 37.5-25 MG tablet Take 1 tablet by mouth daily. Reported on 04/09/2016 Patient not taking: Reported on 12/03/2016 04/09/16   Margaretann Loveless, PA-C    Patient Active Problem List   Diagnosis Date Noted  . Cholecystitis with cholelithiasis 06/04/2016  . Gallstones without obstruction of gallbladder 05/21/2016  . Rheumatoid arthritis of  wrist (HCC) 05/21/2016  . Hypercholesteremia 04/09/2016  . Avitaminosis D 04/09/2016  . 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 Vitamin B deficiency 07/12/2010  . Anxiety, generalized 06/29/2010  . Essential (primary) hypertension 05/02/2008  . Adult hypothyroidism 02/10/2008  . Clinical depression 03/17/2007  . Allergic rhinitis 11/04/2005  . Arthritis or polyarthritis, rheumatoid (HCC) 11/04/2005  . Herpes 11/04/2005    Past Medical History:  Diagnosis Date  . Arthritis   . Depression   . GERD (gastroesophageal reflux disease)   . History of deep vein thrombosis    Right calf  . History of peritonitis 2002?   Right calf.  . Hypertension   . PONV (postoperative nausea and vomiting)   . Thyroid disease     Social History   Social History  . Marital status: Single    Spouse name: N/A  . Number of children: N/A  . Years of education: N/A   Occupational History  . Not on file.   Social History Main Topics  . Smoking status: Former Smoker    Types: Cigarettes  . Smokeless tobacco: Never Used  . Alcohol use Yes     Comment: Occasional  . Drug use: No  . Sexual activity: No   Other Topics Concern  . Not on file   Social History Narrative  . No narrative on file    Allergies  Allergen Reactions  . Remicade [Infliximab]  Anaphylaxis  . Shellfish-Derived Products Rash and Swelling  . Ciprofloxacin   . Demerol [Meperidine] Nausea And Vomiting  . Nsaids   . Tolmetin   . Aspirin Rash  . Clinoril [Sulindac] Rash  . Codeine Nausea And Vomiting    Severe vomiting  . Methotrexate Nausea And Vomiting and Rash  . Methotrexate Derivatives Rash and Cough  . Penicillins Rash  . Shellfish Allergy Swelling and Rash    Review of Systems  Constitutional: Negative.   HENT: Negative.   Eyes: Negative.   Respiratory: Negative.   Cardiovascular: Negative.   Gastrointestinal: Negative.   Genitourinary: Negative.   Musculoskeletal: Positive for joint pain.  Skin: Negative.    Neurological: Negative.   Endo/Heme/Allergies: Negative.   Psychiatric/Behavioral: Negative.     Immunization History  Administered Date(s) Administered  . Influenza-Unspecified 10/20/2015  . Pneumococcal Polysaccharide-23 10/24/2009  . Tdap 11/03/2007, 12/09/2011    Objective:  BP 126/80 (BP Location: Left Arm, Patient Position: Sitting, Cuff Size: Large)   Pulse 78   Temp 97.8 F (36.6 C) (Oral)   Resp 16   Wt 203 lb (92.1 kg)   BMI 32.77 kg/m   Physical Exam  Constitutional: She is oriented to person, place, and time and well-developed, well-nourished, and in no distress.  HENT:  Head: Normocephalic and atraumatic.  Eyes: Conjunctivae are normal. No scleral icterus.  Neck: No thyromegaly present.  Cardiovascular: Normal rate, regular rhythm and normal heart sounds.   Pulmonary/Chest: Effort normal and breath sounds normal.  Abdominal: Soft.  Musculoskeletal: She exhibits deformity.  RA changes of hands.  Lymphadenopathy:    She has no cervical adenopathy.  Neurological: She is alert and oriented to person, place, and time.  Skin: Skin is warm and dry.  Psychiatric: Mood, memory, affect and judgment normal.    Lab Results  Component Value Date   WBC 16.8 (H) 06/04/2016   HGB 12.4 06/04/2016   HCT 37.6 06/04/2016   PLT 284 06/04/2016   GLUCOSE 104 (H) 04/23/2016   CHOL 195 03/11/2014   TRIG 129 03/11/2014   HDL 52 03/11/2014   LDLCALC 117 03/11/2014   TSH 4.69 03/11/2014    CMP     Component Value Date/Time   NA 140 04/23/2016 1649   K 3.6 05/23/2016 0920   CL 101 04/23/2016 1649   CO2 25 04/23/2016 1649   GLUCOSE 104 (H) 04/23/2016 1649   BUN 17 04/23/2016 1649   CREATININE 1.07 (H) 06/04/2016 1202   CALCIUM 10.3 04/23/2016 1649   PROT 6.7 04/23/2016 1649   ALBUMIN 4.5 04/23/2016 1649   AST 16 04/23/2016 1649   ALT 18 04/23/2016 1649   ALKPHOS 104 04/23/2016 1649   BILITOT 0.4 04/23/2016 1649   GFRNONAA 54 (L) 06/04/2016 1202   GFRAA >60  06/04/2016 1202    Assessment and Plan :  1. Essential hypertension  - CBC with Differential/Platelet - Comprehensive metabolic panel  2. Adult hypothyroidism  - TSH  3. Need for influenza vaccination  - Flu Vaccine QUAD 36+ mos IM 4.Rhematoid Arthritis  5.Chronic Anxiety/Depression  [name] MD [facility] 12/03/2016 3:21 PM

## 2016-12-04 ENCOUNTER — Telehealth: Payer: Self-pay

## 2016-12-04 LAB — CBC WITH DIFFERENTIAL/PLATELET
BASOS: 0 %
Basophils Absolute: 0 10*3/uL (ref 0.0–0.2)
EOS (ABSOLUTE): 0.1 10*3/uL (ref 0.0–0.4)
EOS: 1 %
HEMATOCRIT: 36.8 % (ref 34.0–46.6)
Hemoglobin: 12.4 g/dL (ref 11.1–15.9)
IMMATURE GRANS (ABS): 0 10*3/uL (ref 0.0–0.1)
IMMATURE GRANULOCYTES: 0 %
LYMPHS: 17 %
Lymphocytes Absolute: 1.5 10*3/uL (ref 0.7–3.1)
MCH: 27.5 pg (ref 26.6–33.0)
MCHC: 33.7 g/dL (ref 31.5–35.7)
MCV: 82 fL (ref 79–97)
Monocytes Absolute: 0.5 10*3/uL (ref 0.1–0.9)
Monocytes: 5 %
NEUTROS ABS: 7.2 10*3/uL — AB (ref 1.4–7.0)
NEUTROS PCT: 77 %
Platelets: 317 10*3/uL (ref 150–379)
RBC: 4.51 x10E6/uL (ref 3.77–5.28)
RDW: 14.5 % (ref 12.3–15.4)
WBC: 9.4 10*3/uL (ref 3.4–10.8)

## 2016-12-04 LAB — COMPREHENSIVE METABOLIC PANEL
A/G RATIO: 2.1 (ref 1.2–2.2)
ALBUMIN: 4.2 g/dL (ref 3.6–4.8)
ALT: 16 IU/L (ref 0–32)
AST: 16 IU/L (ref 0–40)
Alkaline Phosphatase: 105 IU/L (ref 39–117)
BUN / CREAT RATIO: 14 (ref 12–28)
BUN: 11 mg/dL (ref 8–27)
Bilirubin Total: 0.3 mg/dL (ref 0.0–1.2)
CALCIUM: 9.7 mg/dL (ref 8.7–10.3)
CO2: 24 mmol/L (ref 18–29)
CREATININE: 0.81 mg/dL (ref 0.57–1.00)
Chloride: 104 mmol/L (ref 96–106)
GFR, EST AFRICAN AMERICAN: 89 mL/min/{1.73_m2} (ref >59)
GFR, EST NON AFRICAN AMERICAN: 78 mL/min/{1.73_m2} (ref >59)
GLOBULIN, TOTAL: 2 g/dL (ref 1.5–4.5)
Glucose: 100 mg/dL — ABNORMAL HIGH (ref 65–99)
Potassium: 4.5 mmol/L (ref 3.5–5.2)
SODIUM: 142 mmol/L (ref 134–144)
TOTAL PROTEIN: 6.2 g/dL (ref 6.0–8.5)

## 2016-12-04 LAB — TSH: TSH: 1.91 u[IU]/mL (ref 0.450–4.500)

## 2016-12-04 NOTE — Telephone Encounter (Signed)
-----   Message from Maple Hudson., MD sent at 12/04/2016  7:39 AM EST ----- Labs OK

## 2016-12-04 NOTE — Telephone Encounter (Signed)
Patient advised.

## 2017-01-15 ENCOUNTER — Other Ambulatory Visit: Payer: Self-pay | Admitting: Family Medicine

## 2017-03-17 ENCOUNTER — Other Ambulatory Visit: Payer: Self-pay | Admitting: Physician Assistant

## 2017-03-17 DIAGNOSIS — I10 Essential (primary) hypertension: Secondary | ICD-10-CM

## 2017-04-22 ENCOUNTER — Telehealth: Payer: Self-pay | Admitting: Family Medicine

## 2017-04-22 NOTE — Telephone Encounter (Signed)
Called Pt to schedule AWV with [facility] - knb °

## 2017-05-04 ENCOUNTER — Emergency Department
Admission: EM | Admit: 2017-05-04 | Discharge: 2017-05-04 | Disposition: A | Discharge status: Home / Self Care | Payer: Medicare PPO | Attending: Emergency Medicine | Admitting: Emergency Medicine

## 2017-05-04 ENCOUNTER — Emergency Department: Payer: Medicare PPO

## 2017-05-04 ENCOUNTER — Encounter: Payer: Self-pay | Admitting: Medical Oncology

## 2017-05-04 DIAGNOSIS — Z87891 Personal history of nicotine dependence: Secondary | ICD-10-CM | POA: Diagnosis not present

## 2017-05-04 DIAGNOSIS — I10 Essential (primary) hypertension: Secondary | ICD-10-CM | POA: Diagnosis not present

## 2017-05-04 DIAGNOSIS — R109 Unspecified abdominal pain: Secondary | ICD-10-CM | POA: Diagnosis present

## 2017-05-04 DIAGNOSIS — Z79899 Other long term (current) drug therapy: Secondary | ICD-10-CM | POA: Insufficient documentation

## 2017-05-04 DIAGNOSIS — E039 Hypothyroidism, unspecified: Secondary | ICD-10-CM | POA: Diagnosis not present

## 2017-05-04 DIAGNOSIS — N390 Urinary tract infection, site not specified: Secondary | ICD-10-CM | POA: Insufficient documentation

## 2017-05-04 LAB — COMPREHENSIVE METABOLIC PANEL
ALBUMIN: 4.1 g/dL (ref 3.5–5.0)
ALK PHOS: 93 U/L (ref 38–126)
ALT: 17 U/L (ref 14–54)
ANION GAP: 9 (ref 5–15)
AST: 18 U/L (ref 15–41)
BILIRUBIN TOTAL: 0.9 mg/dL (ref 0.3–1.2)
BUN: 20 mg/dL (ref 6–20)
CALCIUM: 9.5 mg/dL (ref 8.9–10.3)
CO2: 26 mmol/L (ref 22–32)
Chloride: 103 mmol/L (ref 101–111)
Creatinine, Ser: 0.85 mg/dL (ref 0.44–1.00)
Glucose, Bld: 99 mg/dL (ref 65–99)
Potassium: 3.4 mmol/L — ABNORMAL LOW (ref 3.5–5.1)
Sodium: 138 mmol/L (ref 135–145)
TOTAL PROTEIN: 7.1 g/dL (ref 6.5–8.1)

## 2017-05-04 LAB — URINALYSIS, COMPLETE (UACMP) WITH MICROSCOPIC
Bacteria, UA: NONE SEEN
Bilirubin Urine: NEGATIVE
GLUCOSE, UA: NEGATIVE mg/dL
Hgb urine dipstick: NEGATIVE
Ketones, ur: NEGATIVE mg/dL
NITRITE: NEGATIVE
PH: 7 (ref 5.0–8.0)
PROTEIN: NEGATIVE mg/dL
Specific Gravity, Urine: 1.019 (ref 1.005–1.030)

## 2017-05-04 LAB — CBC
HEMATOCRIT: 39.7 % (ref 35.0–47.0)
Hemoglobin: 13.1 g/dL (ref 12.0–16.0)
MCH: 27 pg (ref 26.0–34.0)
MCHC: 33 g/dL (ref 32.0–36.0)
MCV: 81.7 fL (ref 80.0–100.0)
PLATELETS: 331 10*3/uL (ref 150–440)
RBC: 4.87 MIL/uL (ref 3.80–5.20)
RDW: 14.2 % (ref 11.5–14.5)
WBC: 10.5 10*3/uL (ref 3.6–11.0)

## 2017-05-04 LAB — LIPASE, BLOOD: Lipase: 19 U/L (ref 11–51)

## 2017-05-04 MED ORDER — CEPHALEXIN 500 MG PO CAPS: 500.0000 mg | ORAL_CAPSULE | Freq: Three times a day (TID) | ORAL | 0 refills | Status: DC

## 2017-05-04 NOTE — ED Notes (Signed)
Patient transported to X-ray 

## 2017-05-04 NOTE — ED Provider Notes (Signed)
[facility] Emergency Department Provider Note  Time seen: 9:08 AM  I have reviewed the triage vital signs and the nursing notes.   HISTORY  Chief Complaint Flank Pain    HPI Angela Sherman is a 66 y.o. female with a past medical history of arthritis, depression, gastric reflux, hypertension, who presents to the emergency department with left flank pain. According to the patient since last night she has been experiencing left flank pain which she describes as moderate, sharp. Worse when lying flat but otherwise not provoked by movement. Denies any dysuria, hematuria. Denies any history of kidney stones. Patient does states she has been feeling nauseated today, but denies any vomiting. Denies diarrhea or constipation. Patient states a mild cough but states this is chronic and no worse than normal. Denies any sputum production. Denies any chest pain. Overall patient appears well, no distress, states mild to moderate pain currently located in the left mid back.  Past Medical History:  Diagnosis Date  . Arthritis   . Depression   . GERD (gastroesophageal reflux disease)   . History of deep vein thrombosis    Right calf  . History of peritonitis 2002?   Right calf.  . Hypertension   . PONV (postoperative nausea and vomiting)   . Thyroid disease     Patient Active Problem List   Diagnosis Date Noted  . Cholecystitis with cholelithiasis 06/04/2016  . Gallstones without obstruction of gallbladder 05/21/2016  . Rheumatoid arthritis of wrist (HCC) 05/21/2016  . Hypercholesteremia 04/09/2016  . Avitaminosis D 04/09/2016  . 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 Vitamin B deficiency 07/12/2010  . Anxiety, generalized 06/29/2010  . Essential (primary) hypertension 05/02/2008  . Adult hypothyroidism 02/10/2008  . Clinical depression 03/17/2007  . Allergic rhinitis 11/04/2005  . Arthritis or polyarthritis, rheumatoid (HCC) 11/04/2005  . Herpes 11/04/2005    Past  Surgical History:  Procedure Laterality Date  . APPENDECTOMY  2001   Laparoscopy, Dx-Peritontis  . CHOLECYSTECTOMY N/A 06/04/2016   Procedure: LAPAROSCOPIC CHOLECYSTECTOMY WITH INTRAOPERATIVE CHOLANGIOGRAM;  Surgeon: Earline Mayotte, MD;  Location: [facility] ORS;  Service: General;  Laterality: N/A;  . CHOLECYSTECTOMY  06/04/2016   Procedure: CHOLECYSTECTOMY;  Surgeon: Earline Mayotte, MD;  Location: [facility] ORS;  Service: General;;  Laparoscopic converted to open  . DIAGNOSTIC LAPAROSCOPY    . KNEE SURGERY Right   . Reconstruction of the right hand  2006   surgery  . WRIST SURGERY Left 1989   Dx-RA    Prior to Admission medications   Medication Sig Start Date End Date Taking? Authorizing Provider  acetaminophen (TYLENOL) 500 MG tablet Take 1,000 mg by mouth. 10/02/16 10/02/17  [provider]  cholecalciferol (VITAMIN D) 1000 units tablet Take 1,000 Units by mouth daily.     [provider]  DULoxetine (CYMBALTA) 30 MG capsule TAKE 1 CAPSULE BY MOUTH EVERY DAY 06/06/16   Maple Hudson., MD  KRILL OIL PO Take 500 mg by mouth daily.     [provider]  levothyroxine (SYNTHROID, LEVOTHROID) 75 MCG tablet TAKE 1 TABLET (75 MCG TOTAL) BY MOUTH DAILY. 01/16/17   Maple Hudson., MD  omeprazole (PRILOSEC) 20 MG capsule TAKE 1 CAPSULE(20 MG) BY MOUTH DAILY 10/28/16   Maple Hudson., MD  predniSONE (DELTASONE) 5 MG tablet Take 5 mg by mouth daily with breakfast.    [provider]  triamterene-hydrochlorothiazide (MAXZIDE-25) 37.5-25 MG tablet TAKE 1 TABLET EVERY DAY 03/18/17   Sullivan Lone,  Leonette Monarch., MD    Allergies  Allergen Reactions  . Remicade [Infliximab] Anaphylaxis  . Shellfish-Derived Products Rash and Swelling  . Ciprofloxacin   . Demerol [Meperidine] Nausea And Vomiting  . Nsaids   . Tolmetin   . Aspirin Rash  . Clinoril [Sulindac] Rash  . Codeine Nausea And Vomiting    Severe vomiting  . Methotrexate Nausea And Vomiting and  Rash  . Methotrexate Derivatives Rash and Cough  . Penicillins Rash  . Shellfish Allergy Swelling and Rash    Family History  Problem Relation Age of Onset  . Hypertension Brother     Social History Social History  Substance Use Topics  . Smoking status: Former Smoker    Types: Cigarettes  . Smokeless tobacco: Never Used  . Alcohol use Yes     Comment: Occasional    Review of Systems Constitutional: Negative for fever. ENT: Negative for congestion Cardiovascular: Negative for chest pain. Respiratory: Negative for shortness of breath. Gastrointestinal: Negative for abdominal pain. Positive for nausea. Negative for vomiting or diarrhea. Genitourinary: Negative for dysuria. Negative for hematuria. Musculoskeletal: Mild/moderate left mid back pain. Skin: Negative for rash. Neurological: Negative for headache All other ROS negative  ____________________________________________   PHYSICAL EXAM:  VITAL SIGNS: ED Triage Vitals [05/04/17 0851]  Enc Vitals Group     BP (!) 152/99     Pulse Rate 70     Resp 18     Temp 97.7 F (36.5 C)     Temp Source Oral     SpO2 100 %     Weight 200 lb (90.7 kg)     Height 5\' 6"  (1.676 m)     Head Circumference      Peak Flow      Pain Score 8     Pain Loc      Pain Edu?      Excl. in GC?     Constitutional: Alert and oriented. Well appearing and in no distress. Eyes: Normal exam ENT   Head: Normocephalic and atraumatic.   Mouth/Throat: Mucous membranes are moist. Cardiovascular: Normal rate, regular rhythm. No murmur Respiratory: Normal respiratory effort without tachypnea nor retractions. Breath sounds are clear Gastrointestinal: Soft and nontender. No distention.  No CVA tenderness. Musculoskeletal: Nontender with normal range of motion in all extremities. No back tenderness on exam.  Neurologic:  Normal speech and language. No gross focal neurologic deficits Skin:  Skin is warm, dry and intact.  Psychiatric: Mood  and affect are normal.   ____________________________________________     RADIOLOGY  CT largely negative  ____________________________________________   INITIAL IMPRESSION / ASSESSMENT AND PLAN / ED COURSE  Pertinent labs & imaging results that were available during my care of the patient were reviewed by me and considered in my medical decision making (see chart for details).  Patient presents to the emergency department with left mid back pain. Patient states some nausea but denies any vomiting or diarrhea. Denies hematuria or dysuria. Patient has a nontender exam including no back tenderness, no CVA tenderness, nontender abdomen. We will check labs, CT renal scan and continue to closely monitor.  CT shows no acute abnormality. Labs are largely within normal limits besides a urinalysis which is indicative of a urinary tract infection. We will treat with Keflex, send a urine culture. Patient is agreeable to this plan and will follow up with her doctor.  ____________________________________________   FINAL CLINICAL IMPRESSION(S) / ED DIAGNOSES  Left flank pain Urinary tract infection  Minna Antis, MD 05/04/17 1044

## 2017-05-04 NOTE — ED Triage Notes (Signed)
Pt reports left flank pain that began yesterday and has worsened through the night. Pt reports nausea.

## 2017-05-04 NOTE — ED Notes (Signed)
Pt attempting to obtain urine for culture, not enough in lab. Ok for discharge if can't get urine per dr Lenard Lance

## 2017-05-05 ENCOUNTER — Other Ambulatory Visit: Payer: Self-pay | Admitting: Family Medicine

## 2017-05-05 ENCOUNTER — Encounter: Payer: Self-pay | Admitting: Family Medicine

## 2017-05-05 ENCOUNTER — Ambulatory Visit (INDEPENDENT_AMBULATORY_CARE_PROVIDER_SITE_OTHER): Payer: Medicare PPO | Admitting: Family Medicine

## 2017-05-05 ENCOUNTER — Telehealth: Payer: Self-pay | Admitting: Family Medicine

## 2017-05-05 VITALS — BP 128/80 | HR 88 | Temp 97.8°F | Resp 16

## 2017-05-05 DIAGNOSIS — R110 Nausea: Secondary | ICD-10-CM

## 2017-05-05 DIAGNOSIS — R109 Unspecified abdominal pain: Secondary | ICD-10-CM

## 2017-05-05 DIAGNOSIS — N390 Urinary tract infection, site not specified: Secondary | ICD-10-CM

## 2017-05-05 LAB — URINE CULTURE: Culture: NO GROWTH

## 2017-05-05 MED ORDER — DOXYCYCLINE HYCLATE 100 MG PO TABS: 100.0000 mg | ORAL_TABLET | Freq: Two times a day (BID) | ORAL | 0 refills | Status: DC

## 2017-05-05 MED ORDER — ONDANSETRON HCL 4 MG PO TABS: 4.0000 mg | ORAL_TABLET | Freq: Three times a day (TID) | ORAL | 1 refills | Status: DC | PRN

## 2017-05-05 MED ORDER — BACLOFEN 10 MG PO TABS: 10.0000 mg | ORAL_TABLET | Freq: Three times a day (TID) | ORAL | 1 refills | Status: DC

## 2017-05-05 NOTE — Progress Notes (Signed)
Subjective:  HPI Pt is here today for a follow up from the ER. She was seen for flank pain and UTI. She a CT done in the ER and had no acute abnormality. Labs were normal except for UA showing UTI. She was treated with Keflex and urine was sent for culture.   Pt is not feeling any better. She is still having back pain. She reports that it feels different form normal RA or muscular back pain. She has been nausea and vomited. She was offered pain medication in the hospital but declined because it normally makes her so sick. Pt could not take the Keflex because she started having a rash on her stomach. She took 2 pills and stopped and her rash has gotten better.    Prior to Admission medications   Medication Sig Start Date End Date Taking? Authorizing Provider  cephALEXin (KEFLEX) 500 MG capsule Take 1 capsule (500 mg total) by mouth 3 (three) times daily. 05/04/17   Minna Antis, MD  cholecalciferol (VITAMIN D) 1000 units tablet Take 1,000 Units by mouth daily.     [provider]  DULoxetine (CYMBALTA) 30 MG capsule TAKE 1 CAPSULE BY MOUTH EVERY DAY 06/06/16   Maple Hudson., MD  levothyroxine (SYNTHROID, LEVOTHROID) 75 MCG tablet TAKE 1 TABLET (75 MCG TOTAL) BY MOUTH DAILY. 01/16/17   Maple Hudson., MD  omeprazole (PRILOSEC) 20 MG capsule TAKE 1 CAPSULE(20 MG) BY MOUTH DAILY 10/28/16   Maple Hudson., MD  predniSONE (DELTASONE) 5 MG tablet Take 5 mg by mouth daily with breakfast.    [provider]  triamterene-hydrochlorothiazide (MAXZIDE-25) 37.5-25 MG tablet TAKE 1 TABLET EVERY DAY 03/18/17   Maple Hudson., MD    Patient Active Problem List   Diagnosis Date Noted  . Cholecystitis with cholelithiasis 06/04/2016  . Gallstones without obstruction of gallbladder 05/21/2016  . Rheumatoid arthritis of wrist (HCC) 05/21/2016  . Hypercholesteremia 04/09/2016  . Avitaminosis D 04/09/2016  . 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 Vitamin B  deficiency 07/12/2010  . Anxiety, generalized 06/29/2010  . Essential (primary) hypertension 05/02/2008  . Adult hypothyroidism 02/10/2008  . Clinical depression 03/17/2007  . Allergic rhinitis 11/04/2005  . Arthritis or polyarthritis, rheumatoid (HCC) 11/04/2005  . Herpes 11/04/2005    Past Medical History:  Diagnosis Date  . Arthritis   . Depression   . GERD (gastroesophageal reflux disease)   . History of deep vein thrombosis    Right calf  . History of peritonitis 2002?   Right calf.  . Hypertension   . PONV (postoperative nausea and vomiting)   . Thyroid disease     Social History   Social History  . Marital status: Single    Spouse name: N/A  . Number of children: N/A  . Years of education: N/A   Occupational History  . Not on file.   Social History Main Topics  . Smoking status: Former Smoker    Types: Cigarettes  . Smokeless tobacco: Never Used  . Alcohol use Yes     Comment: Occasional  . Drug use: No  . Sexual activity: No   Other Topics Concern  . Not on file   Social History Narrative  . No narrative on file    Allergies  Allergen Reactions  . Remicade [Infliximab] Anaphylaxis  . Shellfish-Derived Products Rash and Swelling  . Ciprofloxacin   . Demerol [Meperidine] Nausea And Vomiting  . Nsaids   . Tolmetin   .  Aspirin Rash  . Clinoril [Sulindac] Rash  . Codeine Nausea And Vomiting    Severe vomiting  . Methotrexate Nausea And Vomiting and Rash  . Methotrexate Derivatives Rash and Cough  . Penicillins Rash  . Shellfish Allergy Swelling and Rash    Review of Systems  Constitutional: Positive for malaise/fatigue.  HENT: Negative.   Eyes: Negative.   Respiratory: Negative.   Cardiovascular: Negative.   Gastrointestinal: Positive for nausea and vomiting.  Genitourinary: Positive for frequency.  Musculoskeletal: Positive for back pain.  Skin: Positive for itching and rash.  Neurological: Negative.   Endo/Heme/Allergies: Negative.    Psychiatric/Behavioral: The patient has insomnia.     Immunization History  Administered Date(s) Administered  . Influenza,inj,Quad PF,36+ Mos 12/03/2016  . Influenza-Unspecified 10/20/2015  . Pneumococcal Polysaccharide-23 10/24/2009  . Tdap 11/03/2007, 12/09/2011    Objective:  BP 128/80 (BP Location: Left Arm, Patient Position: Sitting, Cuff Size: Large)   Pulse 88   Temp 97.8 F (36.6 C) (Oral)   Resp 16   Physical Exam  Constitutional: She is oriented to person, place, and time and well-developed, well-nourished, and in no distress.  Eyes: Conjunctivae and EOM are normal. Pupils are equal, round, and reactive to light.  Neck: Normal range of motion. Neck supple.  Cardiovascular: Normal rate, regular rhythm, normal heart sounds and intact distal pulses.   Pulmonary/Chest: Effort normal and breath sounds normal.  Neurological: She is alert and oriented to person, place, and time. She has normal reflexes. Gait normal. GCS score is 15.  Skin: Skin is warm and dry.  Psychiatric: Mood, memory, affect and judgment normal.    Lab Results  Component Value Date   WBC 10.5 05/04/2017   HGB 13.1 05/04/2017   HCT 39.7 05/04/2017   PLT 331 05/04/2017   GLUCOSE 99 05/04/2017   CHOL 195 03/11/2014   TRIG 129 03/11/2014   HDL 52 03/11/2014   LDLCALC 117 03/11/2014   TSH 1.910 12/03/2016    CMP     Component Value Date/Time   NA 138 05/04/2017 0908   NA 142 12/03/2016 0000   K 3.4 (L) 05/04/2017 0908   CL 103 05/04/2017 0908   CO2 26 05/04/2017 0908   GLUCOSE 99 05/04/2017 0908   BUN 20 05/04/2017 0908   BUN 11 12/03/2016 0000   CREATININE 0.85 05/04/2017 0908   CALCIUM 9.5 05/04/2017 0908   PROT 7.1 05/04/2017 0908   PROT 6.2 12/03/2016 0000   ALBUMIN 4.1 05/04/2017 0908   ALBUMIN 4.2 12/03/2016 0000   AST 18 05/04/2017 0908   ALT 17 05/04/2017 0908   ALKPHOS 93 05/04/2017 0908   BILITOT 0.9 05/04/2017 0908   BILITOT 0.3 12/03/2016 0000   GFRNONAA >60 05/04/2017  0908   GFRAA >60 05/04/2017 0908    Assessment and Plan :  1. Urinary tract infection without hematuria, site unspecified  - doxycycline (VIBRA-TABS) 100 MG tablet; Take 1 tablet (100 mg total) by mouth 2 (two) times daily.  Dispense: 20 tablet; Refill: 0  2. Flank pain Uncertain etiology presently.RTC 2 days. - baclofen (LIORESAL) 10 MG tablet; Take 1 tablet (10 mg total) by mouth 3 (three) times daily.  Dispense: 30 each; Refill: 1  3. Nausea  - ondansetron (ZOFRAN) 4 MG tablet; Take 1 tablet (4 mg total) by mouth every 8 (eight) hours as needed for nausea or vomiting.  Dispense: 55 tablet; Refill: 1  HPI, Exam, and A&P Transcribed under the direction and in the presence of Jhaniya Briski L. Sullivan Lone  Montez Hageman, MD  Electronically Signed: Silvio Pate, CMA I have done the exam and reviewed the above chart and it is accurate to the best of my knowledge. Dentist has been used in this note in any air is in the dictation or transcription are unintentional.  [name] MD [facility] 05/05/2017 11:53 AM

## 2017-05-05 NOTE — Telephone Encounter (Signed)
See below. Appointment is made for today-aa

## 2017-05-05 NOTE — Telephone Encounter (Signed)
Pt was discharged from the ER yesterday for back pain.  I have scheduled a hospital follow up/MW

## 2017-05-07 ENCOUNTER — Ambulatory Visit (INDEPENDENT_AMBULATORY_CARE_PROVIDER_SITE_OTHER): Payer: Medicare PPO | Admitting: Family Medicine

## 2017-05-07 ENCOUNTER — Encounter: Payer: Self-pay | Admitting: Family Medicine

## 2017-05-07 VITALS — BP 128/60 | HR 130 | Temp 97.5°F | Resp 16

## 2017-05-07 DIAGNOSIS — B029 Zoster without complications: Secondary | ICD-10-CM

## 2017-05-07 MED ORDER — VALACYCLOVIR HCL 1 G PO TABS: 1000.0000 mg | ORAL_TABLET | Freq: Three times a day (TID) | ORAL | 0 refills | Status: DC

## 2017-05-07 NOTE — Patient Instructions (Signed)
If the pain gets worse can take a Gabapentin at night. Call back in 1 week to tell us how you are feeling.

## 2017-05-07 NOTE — Progress Notes (Signed)
Subjective:  HPI Pt is here for a 2 day follow up of flank pain on her left side. Possible kidney stone or muscle? Started baclofen. UTI changed to Doxy due to rash. Nausea given Zofran. Today pt reports that her back pain is much better however she now has a rash on her left side of her abdomen that is raised and blister like lesions that burn ans sting a little and only really hurt when they are touched with something.   Prior to Admission medications   Medication Sig Start Date End Date Taking? Authorizing Provider  baclofen (LIORESAL) 10 MG tablet TAKE 1 TABLET(10 MG) BY MOUTH THREE TIMES DAILY 05/05/17   Maple Hudson., MD  cephALEXin (KEFLEX) 500 MG capsule Take 1 capsule (500 mg total) by mouth 3 (three) times daily. Patient not taking: Reported on 05/05/2017 05/04/17   Minna Antis, MD  cholecalciferol (VITAMIN D) 1000 units tablet Take 1,000 Units by mouth daily.     [provider]  doxycycline (VIBRA-TABS) 100 MG tablet Take 1 tablet (100 mg total) by mouth 2 (two) times daily. 05/05/17   Maple Hudson., MD  DULoxetine (CYMBALTA) 30 MG capsule TAKE 1 CAPSULE BY MOUTH EVERY DAY 06/06/16   Maple Hudson., MD  levothyroxine (SYNTHROID, LEVOTHROID) 75 MCG tablet TAKE 1 TABLET (75 MCG TOTAL) BY MOUTH DAILY. 01/16/17   Maple Hudson., MD  omeprazole (PRILOSEC) 20 MG capsule TAKE 1 CAPSULE(20 MG) BY MOUTH DAILY 10/28/16   Maple Hudson., MD  ondansetron (ZOFRAN) 4 MG tablet Take 1 tablet (4 mg total) by mouth every 8 (eight) hours as needed for nausea or vomiting. 05/05/17   Maple Hudson., MD  predniSONE (DELTASONE) 5 MG tablet Take 5 mg by mouth daily with breakfast.    [provider]  triamterene-hydrochlorothiazide (MAXZIDE-25) 37.5-25 MG tablet TAKE 1 TABLET EVERY DAY 03/18/17   Maple Hudson., MD    Patient Active Problem List   Diagnosis Date Noted  . Cholecystitis with cholelithiasis 06/04/2016  . Gallstones  without obstruction of gallbladder 05/21/2016  . Rheumatoid arthritis of wrist (HCC) 05/21/2016  . Hypercholesteremia 04/09/2016  . Avitaminosis D 04/09/2016  . 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 Vitamin B deficiency 07/12/2010  . Anxiety, generalized 06/29/2010  . Essential (primary) hypertension 05/02/2008  . Adult hypothyroidism 02/10/2008  . Clinical depression 03/17/2007  . Allergic rhinitis 11/04/2005  . Arthritis or polyarthritis, rheumatoid (HCC) 11/04/2005  . Herpes 11/04/2005    Past Medical History:  Diagnosis Date  . Arthritis   . Depression   . GERD (gastroesophageal reflux disease)   . History of deep vein thrombosis    Right calf  . History of peritonitis 2002?   Right calf.  . Hypertension   . PONV (postoperative nausea and vomiting)   . Thyroid disease     Social History   Social History  . Marital status: Single    Spouse name: N/A  . Number of children: N/A  . Years of education: N/A   Occupational History  . Not on file.   Social History Main Topics  . Smoking status: Former Smoker    Types: Cigarettes  . Smokeless tobacco: Never Used  . Alcohol use Yes     Comment: Occasional  . Drug use: No  . Sexual activity: No   Other Topics Concern  . Not on file   Social History Narrative  . No narrative on file  Allergies  Allergen Reactions  . Remicade [Infliximab] Anaphylaxis  . Shellfish-Derived Products Rash and Swelling  . Ciprofloxacin   . Demerol [Meperidine] Nausea And Vomiting  . Nsaids   . Tolmetin   . Aspirin Rash  . Clinoril [Sulindac] Rash  . Codeine Nausea And Vomiting    Severe vomiting  . Methotrexate Nausea And Vomiting and Rash  . Methotrexate Derivatives Rash and Cough  . Penicillins Rash  . Shellfish Allergy Swelling and Rash    Review of Systems  Constitutional: Positive for malaise/fatigue.  HENT: Negative.   Eyes: Negative.   Respiratory: Negative.   Cardiovascular: Negative.   Gastrointestinal:  Negative.   Genitourinary: Negative.   Musculoskeletal: Positive for back pain and joint pain.  Skin: Positive for itching and rash.  Neurological: Negative.   Endo/Heme/Allergies: Negative.   Psychiatric/Behavioral: Negative.     Immunization History  Administered Date(s) Administered  . Influenza,inj,Quad PF,36+ Mos 12/03/2016  . Influenza-Unspecified 10/20/2015  . Pneumococcal Polysaccharide-23 10/24/2009  . Tdap 11/03/2007, 12/09/2011    Objective:  BP 128/60 (BP Location: Right Arm, Patient Position: Sitting, Cuff Size: Large)   Pulse (!) 130   Temp 97.5 F (36.4 C) (Oral)   Resp 16   Physical Exam  Constitutional: She is oriented to person, place, and time and well-developed, well-nourished, and in no distress.  HENT:  Head: Normocephalic and atraumatic.  Eyes: Conjunctivae are normal. No scleral icterus.  Neck: No thyromegaly present.  Cardiovascular: Normal rate, regular rhythm and normal heart sounds.   Pulmonary/Chest: Effort normal and breath sounds normal.  Abdominal: Soft.  Neurological: She is alert and oriented to person, place, and time. Gait normal. GCS score is 15.  Skin: Skin is warm.  Vesicular rash of left thorax in dermatomal distribution.  Psychiatric: Mood, memory, affect and judgment normal.    Lab Results  Component Value Date   WBC 10.5 05/04/2017   HGB 13.1 05/04/2017   HCT 39.7 05/04/2017   PLT 331 05/04/2017   GLUCOSE 99 05/04/2017   CHOL 195 03/11/2014   TRIG 129 03/11/2014   HDL 52 03/11/2014   LDLCALC 117 03/11/2014   TSH 1.910 12/03/2016    CMP     Component Value Date/Time   NA 138 05/04/2017 0908   NA 142 12/03/2016 0000   K 3.4 (L) 05/04/2017 0908   CL 103 05/04/2017 0908   CO2 26 05/04/2017 0908   GLUCOSE 99 05/04/2017 0908   BUN 20 05/04/2017 0908   BUN 11 12/03/2016 0000   CREATININE 0.85 05/04/2017 0908   CALCIUM 9.5 05/04/2017 0908   PROT 7.1 05/04/2017 0908   PROT 6.2 12/03/2016 0000   ALBUMIN 4.1 05/04/2017  0908   ALBUMIN 4.2 12/03/2016 0000   AST 18 05/04/2017 0908   ALT 17 05/04/2017 0908   ALKPHOS 93 05/04/2017 0908   BILITOT 0.9 05/04/2017 0908   BILITOT 0.3 12/03/2016 0000   GFRNONAA >60 05/04/2017 0908   GFRAA >60 05/04/2017 0908    Assessment and Plan :  1. Herpes zoster without complication  Back pain much improved but developed shingles yesterday. Likely this is what the pain was from. Treat with valtrex. Pt has gabapentin at home if she needs them and will call in 1 week to tell us how she is improving.  - valACYclovir (VALTREX) 1000 MG tablet; Take 1 tablet (1,000 mg total) by mouth 3 (three) times daily.  Dispense: 21 tablet; Refill: 0 2.Rheumatoid Arthritis  HPI, Exam, and A&P Transcribed under the direction  and in the presence of Julya Alioto L. Wendelyn Breslow, MD  Electronically Signed: Silvio Pate, CMA  [name] MD [facility] 05/07/2017 2:03 PM

## 2017-05-16 ENCOUNTER — Telehealth: Payer: Self-pay

## 2017-05-16 DIAGNOSIS — B0229 Other postherpetic nervous system involvement: Secondary | ICD-10-CM

## 2017-05-16 MED ORDER — GABAPENTIN 100 MG PO CAPS: 100.0000 mg | ORAL_CAPSULE | Freq: Two times a day (BID) | ORAL | 1 refills | Status: DC

## 2017-05-16 NOTE — Telephone Encounter (Signed)
Patient saw Dr Sullivan Lone on 05/07/17 for shingles. Patient has been taking Gabapentin 100 mg that she had left over from hand surgeries she had in the fall last year 2017 but she has 2 tablets left. She did not want to go over the weekend without the medication. She only tries to take it at bedtime 1 tablet but yesterday was a more painful day so she took 2 tablets. Could you please review this for Dr Sullivan Lone? Thank you-aa

## 2017-05-16 NOTE — Telephone Encounter (Signed)
Patient advised-aa 

## 2017-05-16 NOTE — Telephone Encounter (Signed)
Sent into [facility]

## 2017-05-19 ENCOUNTER — Ambulatory Visit (INDEPENDENT_AMBULATORY_CARE_PROVIDER_SITE_OTHER): Payer: Medicare PPO | Admitting: Family Medicine

## 2017-05-19 VITALS — BP 92/60 | HR 100 | Temp 97.9°F | Resp 18 | Wt 186.0 lb

## 2017-05-19 DIAGNOSIS — R634 Abnormal weight loss: Secondary | ICD-10-CM

## 2017-05-19 DIAGNOSIS — B029 Zoster without complications: Secondary | ICD-10-CM | POA: Diagnosis not present

## 2017-05-19 DIAGNOSIS — R1114 Bilious vomiting: Secondary | ICD-10-CM

## 2017-05-19 DIAGNOSIS — B0229 Other postherpetic nervous system involvement: Secondary | ICD-10-CM | POA: Diagnosis not present

## 2017-05-19 DIAGNOSIS — M549 Dorsalgia, unspecified: Secondary | ICD-10-CM | POA: Diagnosis not present

## 2017-05-19 LAB — POCT URINALYSIS DIPSTICK
BILIRUBIN UA: NEGATIVE
Glucose, UA: NEGATIVE
Ketones, UA: NEGATIVE
NITRITE UA: NEGATIVE
PROTEIN UA: NEGATIVE
RBC UA: NEGATIVE
Spec Grav, UA: 1.01 (ref 1.010–1.025)
UROBILINOGEN UA: NEGATIVE U/dL — AB
pH, UA: 6 (ref 5.0–8.0)

## 2017-05-19 NOTE — Progress Notes (Signed)
Angela Sherman  MRN: [id number] DOB: September 24, 1951  Subjective:  HPI  Patient is here to follow up on shingles. Last office visit was on 05/07/17. She was treated with Valtrex and started taking Gabapentin 100 mg usually just 1 tablet at bedtime. Rash is healing well, some burning present at times but doing better. But she is having back pain issue, she wakes up every night at 12 with a severe pain in the middle of her back describes it as a "knife stabbing pain." She then starts to vomit. This happens every night. Nothing helps the pain. She only gets this severe at night at 12 during the day it is less severe, she is aware of the pain but it is manageable during the day. She is not eating a lot but is trying to drink plenty of fluids. BP Readings from Last 3 Encounters:  05/19/17 92/60  05/07/17 128/60  05/05/17 128/80   Wt Readings from Last 3 Encounters:  05/19/17 186 lb (84.4 kg)  05/04/17 200 lb (90.7 kg)  12/03/16 203 lb (92.1 kg)     Patient Active Problem List   Diagnosis Date Noted  . Cholecystitis with cholelithiasis 06/04/2016  . Gallstones without obstruction of gallbladder 05/21/2016  . Rheumatoid arthritis of wrist (HCC) 05/21/2016  . Hypercholesteremia 04/09/2016  . Avitaminosis D 04/09/2016  . 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 Vitamin B deficiency 07/12/2010  . Anxiety, generalized 06/29/2010  . Essential (primary) hypertension 05/02/2008  . Adult hypothyroidism 02/10/2008  . Clinical depression 03/17/2007  . Allergic rhinitis 11/04/2005  . Arthritis or polyarthritis, rheumatoid (HCC) 11/04/2005  . Herpes 11/04/2005    Past Medical History:  Diagnosis Date  . Arthritis   . Depression   . GERD (gastroesophageal reflux disease)   . History of deep vein thrombosis    Right calf  . History of peritonitis 2002?   Right calf.  . Hypertension   . PONV (postoperative nausea and vomiting)   . Thyroid disease     Social History   Social History  . Marital  status: Single    Spouse name: N/A  . Number of children: N/A  . Years of education: N/A   Occupational History  . Not on file.   Social History Main Topics  . Smoking status: Former Smoker    Types: Cigarettes  . Smokeless tobacco: Never Used  . Alcohol use Yes     Comment: Occasional  . Drug use: No  . Sexual activity: No   Other Topics Concern  . Not on file   Social History Narrative  . No narrative on file    Outpatient Encounter Prescriptions as of 05/19/2017  Medication Sig  . cholecalciferol (VITAMIN D) 1000 units tablet Take 1,000 Units by mouth daily.   . DULoxetine (CYMBALTA) 30 MG capsule TAKE 1 CAPSULE BY MOUTH EVERY DAY  . gabapentin (NEURONTIN) 100 MG capsule Take 1 capsule (100 mg total) by mouth 2 (two) times daily.  [name] levothyroxine (SYNTHROID, LEVOTHROID) 75 MCG tablet TAKE 1 TABLET (75 MCG TOTAL) BY MOUTH DAILY.  [name] omeprazole (PRILOSEC) 20 MG capsule TAKE 1 CAPSULE(20 MG) BY MOUTH DAILY  . ondansetron (ZOFRAN) 4 MG tablet Take 1 tablet (4 mg total) by mouth every 8 (eight) hours as needed for nausea or vomiting.  . predniSONE (DELTASONE) 5 MG tablet Take 5 mg by mouth daily with breakfast.  . triamterene-hydrochlorothiazide (MAXZIDE-25) 37.5-25 MG tablet TAKE 1 TABLET EVERY DAY  . baclofen (LIORESAL) 10 MG tablet TAKE  1 TABLET(10 MG) BY MOUTH THREE TIMES DAILY (Patient not taking: Reported on 05/07/2017)  . [DISCONTINUED] cephALEXin (KEFLEX) 500 MG capsule Take 1 capsule (500 mg total) by mouth 3 (three) times daily. (Patient not taking: Reported on 05/05/2017)  . [DISCONTINUED] doxycycline (VIBRA-TABS) 100 MG tablet Take 1 tablet (100 mg total) by mouth 2 (two) times daily.  . [DISCONTINUED] valACYclovir (VALTREX) 1000 MG tablet Take 1 tablet (1,000 mg total) by mouth 3 (three) times daily.   No facility-administered encounter medications on file as of 05/19/2017.     Allergies  Allergen Reactions  . Remicade [Infliximab] Anaphylaxis  . Shellfish-Derived  Products Rash and Swelling  . Ciprofloxacin   . Demerol [Meperidine] Nausea And Vomiting  . Nsaids   . Tolmetin   . Aspirin Rash  . Clinoril [Sulindac] Rash  . Codeine Nausea And Vomiting    Severe vomiting  . Methotrexate Nausea And Vomiting and Rash  . Methotrexate Derivatives Rash and Cough  . Penicillins Rash  . Shellfish Allergy Swelling and Rash    Review of Systems  Constitutional: Positive for malaise/fatigue and weight loss.  Respiratory: Negative.   Cardiovascular: Negative.   Gastrointestinal: Positive for vomiting.  Musculoskeletal: Positive for back pain.  Skin: Positive for rash.  Neurological: Positive for weakness.  Endo/Heme/Allergies: Negative.   Psychiatric/Behavioral: Negative.        Hard to sleep well during the night due to the pain    Objective:  BP 92/60   Pulse 100   Temp 97.9 F (36.6 C)   Resp 18   Wt 186 lb (84.4 kg)   BMI 30.02 kg/m   Physical Exam  Constitutional: She is oriented to person, place, and time and well-developed, well-nourished, and in no distress.  HENT:  Head: Normocephalic and atraumatic.  Right Ear: External ear normal.  Left Ear: External ear normal.  Nose: Nose normal.  Cardiovascular: Normal rate, regular rhythm, normal heart sounds and intact distal pulses.  Exam reveals no gallop.   No murmur heard. Pulmonary/Chest: Effort normal and breath sounds normal. No respiratory distress. She has no wheezes.  Abdominal: Soft. She exhibits no distension. There is no tenderness.  Musculoskeletal:  Tenderness in the left CVA ,   Neurological: She is alert and oriented to person, place, and time.  Skin: Skin is warm and dry.  visible rash from shingles is healing nicely.   Psychiatric: Mood, memory, affect and judgment normal.   Assessment and Plan :  1. Herpes zoster without complication Healing well.  2. Postherpetic neuralgia Check labs. The pain patient is having I do not think is coming from post shingles.  Advised patient to stop Gabapentin and if pain gets worse can re start the medication. - CBC with Differential/Platelet  3. Other acute back pain Sounds like renal calix issue. UA is ok today. - CBC with Differential/Platelet - Ambulatory referral to Urology  4. Bilious vomiting with nausea Will refer to urology at this time. If patient still having an issue will refer to GI at that time. Also patient advised to take Omeprazole every day for now. - CBC with Differential/Platelet - Comprehensive metabolic panel - Lipase - Ambulatory referral to Urology  5. Unintentional weight loss 14lb weight loss from 05/04/17 till today 05/19/17. Follow.  - CBC with Differential/Platelet - Comprehensive metabolic panel - Lipase - Ambulatory referral to Urology 6.Rhematoid Arthritis  HPI, Exam and A&P transcribed by Samara Deist, RMA under direction and in the presence of [name], MD. I have  done the exam and reviewed the chart and it is accurate to the best of my knowledge. Dentist has been used and  any errors in dictation or transcription are unintentional. [name] M.D. [facility]

## 2017-05-20 ENCOUNTER — Other Ambulatory Visit: Payer: Self-pay

## 2017-05-20 ENCOUNTER — Telehealth: Payer: Self-pay

## 2017-05-20 DIAGNOSIS — R1114 Bilious vomiting: Secondary | ICD-10-CM

## 2017-05-20 DIAGNOSIS — R634 Abnormal weight loss: Secondary | ICD-10-CM

## 2017-05-20 LAB — CBC WITH DIFFERENTIAL/PLATELET
Basophils Absolute: 0 10*3/uL (ref 0.0–0.2)
Basos: 0 %
EOS (ABSOLUTE): 0.1 10*3/uL (ref 0.0–0.4)
EOS: 0 %
HEMATOCRIT: 40.6 % (ref 34.0–46.6)
HEMOGLOBIN: 13.8 g/dL (ref 11.1–15.9)
IMMATURE GRANULOCYTES: 0 %
Immature Grans (Abs): 0 10*3/uL (ref 0.0–0.1)
LYMPHS ABS: 1.9 10*3/uL (ref 0.7–3.1)
LYMPHS: 15 %
MCH: 28 pg (ref 26.6–33.0)
MCHC: 34 g/dL (ref 31.5–35.7)
MCV: 82 fL (ref 79–97)
Monocytes Absolute: 1.1 10*3/uL — ABNORMAL HIGH (ref 0.1–0.9)
Monocytes: 9 %
NEUTROS PCT: 76 %
Neutrophils Absolute: 9.5 10*3/uL — ABNORMAL HIGH (ref 1.4–7.0)
Platelets: 417 10*3/uL — ABNORMAL HIGH (ref 150–379)
RBC: 4.93 x10E6/uL (ref 3.77–5.28)
RDW: 14.1 % (ref 12.3–15.4)
WBC: 12.6 10*3/uL — AB (ref 3.4–10.8)

## 2017-05-20 LAB — COMPREHENSIVE METABOLIC PANEL
ALBUMIN: 4.4 g/dL (ref 3.6–4.8)
ALT: 15 IU/L (ref 0–32)
AST: 16 IU/L (ref 0–40)
Albumin/Globulin Ratio: 1.9 (ref 1.2–2.2)
Alkaline Phosphatase: 101 IU/L (ref 39–117)
BUN/Creatinine Ratio: 12 (ref 12–28)
BUN: 13 mg/dL (ref 8–27)
Bilirubin Total: 0.9 mg/dL (ref 0.0–1.2)
CALCIUM: 10.2 mg/dL (ref 8.7–10.3)
CO2: 23 mmol/L (ref 18–29)
CREATININE: 1.12 mg/dL — AB (ref 0.57–1.00)
Chloride: 100 mmol/L (ref 96–106)
GFR, EST AFRICAN AMERICAN: 60 mL/min/{1.73_m2} (ref >59)
GFR, EST NON AFRICAN AMERICAN: 52 mL/min/{1.73_m2} — AB (ref >59)
GLOBULIN, TOTAL: 2.3 g/dL (ref 1.5–4.5)
Glucose: 100 mg/dL — ABNORMAL HIGH (ref 65–99)
Potassium: 4 mmol/L (ref 3.5–5.2)
SODIUM: 139 mmol/L (ref 134–144)
Total Protein: 6.7 g/dL (ref 6.0–8.5)

## 2017-05-20 LAB — H. PYLORI ANTIBODY, IGG: H. pylori, IgG AbS: <0.8 Index Value (ref 0.00–0.79)

## 2017-05-20 LAB — LIPASE: Lipase: 24 U/L (ref 14–72)

## 2017-05-20 NOTE — Telephone Encounter (Signed)
Cancel referral. Have pt f/u with me in few weeks.

## 2017-05-20 NOTE — Telephone Encounter (Signed)
Michelle from urology office and states that after looking at the notes and CT scan there is nothing that shows that patient should be seen there, does not look like a urology issue, I advised her what we thought was causing symptoms but they could not see her. Xray would need to be done first and if it showed something urology related then they would see her but otherwise they can not schedule her-aa

## 2017-05-20 NOTE — Telephone Encounter (Signed)
Spoke with patient. Angela Sherman please see below and cancel referral appointment on your side. Patient not having a good day today and will call me back tomorrow to let me know how she is doing-aa

## 2017-05-21 ENCOUNTER — Telehealth: Payer: Self-pay

## 2017-05-21 NOTE — Telephone Encounter (Signed)
-----   Message from Maple Hudson., MD sent at 05/21/2017 10:41 AM EDT ----- Labs stable. Mildly elevated white count. Repeat CBC and renal panel on next visit.

## 2017-05-21 NOTE — Telephone Encounter (Signed)
Left message to call back  

## 2017-05-22 ENCOUNTER — Encounter: Payer: Self-pay | Admitting: Emergency Medicine

## 2017-05-22 ENCOUNTER — Emergency Department
Admission: EM | Admit: 2017-05-22 | Discharge: 2017-05-22 | Disposition: A | Discharge status: Home / Self Care | Payer: Medicare PPO | Attending: Emergency Medicine | Admitting: Emergency Medicine

## 2017-05-22 DIAGNOSIS — Z79899 Other long term (current) drug therapy: Secondary | ICD-10-CM | POA: Diagnosis not present

## 2017-05-22 DIAGNOSIS — B029 Zoster without complications: Secondary | ICD-10-CM | POA: Diagnosis not present

## 2017-05-22 DIAGNOSIS — R110 Nausea: Secondary | ICD-10-CM | POA: Diagnosis present

## 2017-05-22 DIAGNOSIS — I10 Essential (primary) hypertension: Secondary | ICD-10-CM | POA: Insufficient documentation

## 2017-05-22 DIAGNOSIS — Z87891 Personal history of nicotine dependence: Secondary | ICD-10-CM | POA: Insufficient documentation

## 2017-05-22 DIAGNOSIS — E039 Hypothyroidism, unspecified: Secondary | ICD-10-CM | POA: Diagnosis not present

## 2017-05-22 LAB — COMPREHENSIVE METABOLIC PANEL
ALT: 14 U/L (ref 14–54)
ANION GAP: 10 (ref 5–15)
AST: 21 U/L (ref 15–41)
Albumin: 4.4 g/dL (ref 3.5–5.0)
Alkaline Phosphatase: 86 U/L (ref 38–126)
BUN: 12 mg/dL (ref 6–20)
CHLORIDE: 105 mmol/L (ref 101–111)
CO2: 23 mmol/L (ref 22–32)
CREATININE: 0.78 mg/dL (ref 0.44–1.00)
Calcium: 9.9 mg/dL (ref 8.9–10.3)
Glucose, Bld: 102 mg/dL — ABNORMAL HIGH (ref 65–99)
POTASSIUM: 3.3 mmol/L — AB (ref 3.5–5.1)
SODIUM: 138 mmol/L (ref 135–145)
Total Bilirubin: 1.4 mg/dL — ABNORMAL HIGH (ref 0.3–1.2)
Total Protein: 7.3 g/dL (ref 6.5–8.1)

## 2017-05-22 LAB — URINALYSIS, COMPLETE (UACMP) WITH MICROSCOPIC
BACTERIA UA: NONE SEEN
Bilirubin Urine: NEGATIVE
Glucose, UA: NEGATIVE mg/dL
Ketones, ur: 20 mg/dL — AB
Nitrite: NEGATIVE
PH: 7 (ref 5.0–8.0)
PROTEIN: NEGATIVE mg/dL
SPECIFIC GRAVITY, URINE: 1.01 (ref 1.005–1.030)

## 2017-05-22 LAB — CBC
HEMATOCRIT: 40 % (ref 35.0–47.0)
HEMOGLOBIN: 13.9 g/dL (ref 12.0–16.0)
MCH: 28.2 pg (ref 26.0–34.0)
MCHC: 34.8 g/dL (ref 32.0–36.0)
MCV: 80.9 fL (ref 80.0–100.0)
PLATELETS: 362 10*3/uL (ref 150–440)
RBC: 4.95 MIL/uL (ref 3.80–5.20)
RDW: 14.1 % (ref 11.5–14.5)
WBC: 9.5 10*3/uL (ref 3.6–11.0)

## 2017-05-22 LAB — LIPASE, BLOOD: LIPASE: 21 U/L (ref 11–51)

## 2017-05-22 MED ORDER — GABAPENTIN 300 MG PO CAPS: ORAL_CAPSULE | ORAL | 0 refills | Status: DC

## 2017-05-22 MED ORDER — ONDANSETRON HCL 4 MG PO TABS: 4.0000 mg | ORAL_TABLET | Freq: Three times a day (TID) | ORAL | 0 refills | Status: DC | PRN

## 2017-05-22 MED ORDER — GABAPENTIN 300 MG PO CAPS
300.0000 mg | ORAL_CAPSULE | Freq: Once | ORAL | Status: AC
  Administered 2017-05-22: 300 mg via ORAL
  Filled 2017-05-22: qty 1

## 2017-05-22 NOTE — Discharge Instructions (Signed)
You are being treated for what is suspected to be pain related to Shingles.  I am prescribing nausea medication zofran and nerve pain (neuropathic pain) called gabapentin.    Please follow up with your primary care doctor for further treatment of any ongoing shingles related pain, possibly even a chronic pain specialist referral.

## 2017-05-22 NOTE — ED Provider Notes (Signed)
[facility] Emergency Department Provider Note ____________________________________________   I have reviewed the triage vital signs and the triage nursing note.  HISTORY  Chief Complaint Emesis and Herpes Zoster   Historian Patient  HPI Angela Sherman is a 66 y.o. female presenting for left-sided flank and abdominal pain associated with nausea and this has been going on for about 2 weeks and she was initially evaluated in the emergency department with negative CT scan, subsequently rash appeared confirming diagnosis of herpes zoster.  She is here because occasionally she will just have terrible pains on that side to feel little bit more internal as opposed to burning on the skin. She is just not sure what to do.    Past Medical History:  Diagnosis Date  . Arthritis   . Depression   . GERD (gastroesophageal reflux disease)   . History of deep vein thrombosis    Right calf  . History of peritonitis 2002?   Right calf.  . Hypertension   . PONV (postoperative nausea and vomiting)   . Thyroid disease     Patient Active Problem List   Diagnosis Date Noted  . Cholecystitis with cholelithiasis 06/04/2016  . Gallstones without obstruction of gallbladder 05/21/2016  . Rheumatoid arthritis of wrist (HCC) 05/21/2016  . Hypercholesteremia 04/09/2016  . Avitaminosis D 04/09/2016  . 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 Vitamin B deficiency 07/12/2010  . Anxiety, generalized 06/29/2010  . Essential (primary) hypertension 05/02/2008  . Adult hypothyroidism 02/10/2008  . Clinical depression 03/17/2007  . Allergic rhinitis 11/04/2005  . Arthritis or polyarthritis, rheumatoid (HCC) 11/04/2005  . Herpes 11/04/2005    Past Surgical History:  Procedure Laterality Date  . APPENDECTOMY  2001   Laparoscopy, Dx-Peritontis  . CHOLECYSTECTOMY N/A 06/04/2016   Procedure: LAPAROSCOPIC CHOLECYSTECTOMY WITH INTRAOPERATIVE CHOLANGIOGRAM;  Surgeon: Earline Mayotte, MD;   Location: [facility] ORS;  Service: General;  Laterality: N/A;  . CHOLECYSTECTOMY  06/04/2016   Procedure: CHOLECYSTECTOMY;  Surgeon: Earline Mayotte, MD;  Location: [facility] ORS;  Service: General;;  Laparoscopic converted to open  . DIAGNOSTIC LAPAROSCOPY    . KNEE SURGERY Right   . Reconstruction of the right hand  2006   surgery  . WRIST SURGERY Left 1989   Dx-RA    Prior to Admission medications   Medication Sig Start Date End Date Taking? Authorizing Provider  cholecalciferol (VITAMIN D) 1000 units tablet Take 1,000 Units by mouth daily.    Yes [provider]  DULoxetine (CYMBALTA) 30 MG capsule TAKE 1 CAPSULE BY MOUTH EVERY DAY 06/06/16  Yes Maple Hudson., MD  levothyroxine (SYNTHROID, LEVOTHROID) 75 MCG tablet TAKE 1 TABLET (75 MCG TOTAL) BY MOUTH DAILY. 01/16/17  Yes Maple Hudson., MD  omeprazole (PRILOSEC) 20 MG capsule TAKE 1 CAPSULE(20 MG) BY MOUTH DAILY 10/28/16  Yes Maple Hudson., MD  predniSONE (DELTASONE) 5 MG tablet Take 5 mg by mouth daily with breakfast.   Yes [provider]  triamterene-hydrochlorothiazide (MAXZIDE-25) 37.5-25 MG tablet TAKE 1 TABLET EVERY DAY 03/18/17  Yes Maple Hudson., MD  baclofen (LIORESAL) 10 MG tablet TAKE 1 TABLET(10 MG) BY MOUTH THREE TIMES DAILY Patient not taking: Reported on 05/07/2017 05/05/17   Maple Hudson., MD  gabapentin (NEURONTIN) 300 MG capsule Start with 300 mg twice per day tomorrow then 300mg  three times per day 05/22/17 05/22/18  05/24/18, MD  ondansetron (ZOFRAN) 4 MG tablet Take 1 tablet (4 mg total) by mouth  every 8 (eight) hours as needed for nausea or vomiting. 05/22/17   Governor Rooks, MD    Allergies  Allergen Reactions  . Remicade [Infliximab] Anaphylaxis  . Shellfish-Derived Products Rash and Swelling  . Ciprofloxacin   . Demerol [Meperidine] Nausea And Vomiting  . Nsaids   . Tolmetin   . Aspirin Rash  . Clinoril [Sulindac] Rash  . Codeine Nausea And Vomiting     Severe vomiting  . Methotrexate Nausea And Vomiting and Rash  . Methotrexate Derivatives Rash and Cough  . Penicillins Rash  . Shellfish Allergy Swelling and Rash    Family History  Problem Relation Age of Onset  . Hypertension Brother     Social History Social History  Substance Use Topics  . Smoking status: Former Smoker    Types: Cigarettes  . Smokeless tobacco: Never Used  . Alcohol use Yes     Comment: Occasional    Review of Systems  Constitutional: Negative for fever. Eyes: Negative for visual changes. ENT: Negative for sore throat. Cardiovascular: Negative for chest pain. Respiratory: Negative for shortness of breath. Gastrointestinal: Negative for  vomiting and diarrhea. Genitourinary: Negative for dysuria. Musculoskeletal: Negative for back pain. Skin: Negative for rash. Neurological: Negative for headache.  ____________________________________________   PHYSICAL EXAM:  VITAL SIGNS: ED Triage Vitals  Enc Vitals Group     BP 05/22/17 1557 131/83     Pulse Rate 05/22/17 1557 96     Resp 05/22/17 1557 18     Temp 05/22/17 1557 98.7 F (37.1 C)     Temp Source 05/22/17 1557 Oral     SpO2 05/22/17 1557 100 %     Weight 05/22/17 1558 186 lb (84.4 kg)     Height 05/22/17 1558 5\' 6"  (1.676 m)     Head Circumference --      Peak Flow --      Pain Score 05/22/17 1557 9     Pain Loc --      Pain Edu? --      Excl. in GC? --      Constitutional: Alert and oriented. Well appearing and in no distress. HEENT   Head: Normocephalic and atraumatic.      Eyes: Conjunctivae are normal. Pupils equal and round.       Ears:         Nose: No congestion/rhinnorhea.   Mouth/Throat: Mucous membranes are moist.   Neck: No stridor. Cardiovascular/Chest: Normal rate, regular rhythm.  No murmurs, rubs, or gallops. Respiratory: Normal respiratory effort without tachypnea nor retractions. Breath sounds are clear and equal bilaterally. No  wheezes/rales/rhonchi. Gastrointestinal: Soft. No distention, no guarding, no rebound. Nontender to palpation  Genitourinary/rectal:Deferred Musculoskeletal: Nontender with normal range of motion in all extremities. No joint effusions.  No lower extremity tenderness.  No edema. Neurologic:  Normal speech and language. No gross or focal neurologic deficits are appreciated. Skin:  Shingles rash to left abdomen around flank Psychiatric: Mood and affect are normal. Speech and behavior are normal. Patient exhibits appropriate insight and judgment.   ____________________________________________  LABS (pertinent positives/negatives)  Labs Reviewed  COMPREHENSIVE METABOLIC PANEL - Abnormal; Notable for the following:       Result Value   Potassium 3.3 (*)    Glucose, Bld 102 (*)    Total Bilirubin 1.4 (*)    All other components within normal limits  URINALYSIS, COMPLETE (UACMP) WITH MICROSCOPIC - Abnormal; Notable for the following:    Color, Urine YELLOW (*)    APPearance  CLEAR (*)    Hgb urine dipstick SMALL (*)    Ketones, ur 20 (*)    Leukocytes, UA SMALL (*)    Squamous Epithelial / LPF 0-5 (*)    All other components within normal limits  LIPASE, BLOOD  CBC    ____________________________________________    EKG I, Governor Rooks, MD, the attending physician have personally viewed and interpreted all ECGs.  None ____________________________________________  RADIOLOGY All Xrays were viewed by me. Imaging interpreted by Radiologist.  None __________________________________________  PROCEDURES  Procedure(s) performed: None  Critical Care performed: None  ____________________________________________   ED COURSE / ASSESSMENT AND PLAN  Pertinent labs & imaging results that were available during my care of the patient were reviewed by me and considered in my medical decision making (see chart for details).    I reviewed patient's recent ED stay, and in discussion  with her, it certainly seems like the pain is the same it's been ongoing just persistent and not well controlled. She is feeling some sharp pains intermittently deep to the skin, however she has no tenderness to palpation in the abdomen now, and I certainly think that it's all related to the active shingles flare that she has right now.  We discussed adequate pain regimen, and plan to have her on gabapentin. I do think she needs close follow-up with her primary care doctor, and possibly even referral to chronic pain management.      CONSULTATIONS: None   Patient / Family / Caregiver informed of clinical course, medical decision-making process, and agree with plan.   I discussed return precautions, follow-up instructions, and discharge instructions with patient and/or family.   ___________________________________________   FINAL CLINICAL IMPRESSION(S) / ED DIAGNOSES   Final diagnoses:  Herpes zoster without complication              Note: This dictation was prepared with Dragon dictation. Any transcriptional errors that result from this process are unintentional    Governor Rooks, MD 05/26/17 1142

## 2017-05-22 NOTE — ED Triage Notes (Signed)
Pt reports has known shingles that appeared about two weeks ago. Pt reports the pain is intense and causes her to vomit. Pt reports vomiting every day for two weeks.

## 2017-06-09 ENCOUNTER — Telehealth: Payer: Self-pay | Admitting: Family Medicine

## 2017-06-17 ENCOUNTER — Telehealth: Payer: Self-pay | Admitting: Family Medicine

## 2017-06-17 NOTE — Telephone Encounter (Signed)
FYI It was an ER visit for shingles, off and on pain that she has been having, she wanted to figure out on what to do for it-aa

## 2017-06-17 NOTE — Telephone Encounter (Signed)
Pt states she was discharged from [facility] 05/22/17 for shingles.  I have scheduled a hospital follow up appointment/MW

## 2017-06-18 ENCOUNTER — Ambulatory Visit (INDEPENDENT_AMBULATORY_CARE_PROVIDER_SITE_OTHER): Payer: Medicare PPO | Admitting: Family Medicine

## 2017-06-18 VITALS — BP 104/82 | HR 108 | Temp 98.3°F | Resp 18 | Wt 196.0 lb

## 2017-06-18 DIAGNOSIS — B0229 Other postherpetic nervous system involvement: Secondary | ICD-10-CM | POA: Diagnosis not present

## 2017-06-18 DIAGNOSIS — B029 Zoster without complications: Secondary | ICD-10-CM

## 2017-06-18 DIAGNOSIS — R634 Abnormal weight loss: Secondary | ICD-10-CM | POA: Diagnosis not present

## 2017-06-18 DIAGNOSIS — E039 Hypothyroidism, unspecified: Secondary | ICD-10-CM

## 2017-06-18 DIAGNOSIS — I10 Essential (primary) hypertension: Secondary | ICD-10-CM | POA: Diagnosis not present

## 2017-06-18 MED ORDER — GABAPENTIN 300 MG PO CAPS: ORAL_CAPSULE | ORAL | 3 refills | Status: DC

## 2017-06-18 NOTE — Progress Notes (Signed)
Angela Sherman  MRN: [id number] DOB: May 31, 1951  Subjective:  HPI   Patient is here to follow up after been seen at ER on 05/22/17. Diagnoses was Herpes Zoster without complication. RX  For Gabapentin was given but at higher dose 300 mg to take 3 times daily. Her symptoms are much better. She can tell that Gabapentin at this higher dose is helping her a lot. Wt Readings from Last 3 Encounters:  06/18/17 196 lb (88.9 kg)  05/22/17 186 lb (84.4 kg)  05/19/17 186 lb (84.4 kg)   Patient Active Problem List   Diagnosis Date Noted  . Cholecystitis with cholelithiasis 06/04/2016  . Gallstones without obstruction of gallbladder 05/21/2016  . Rheumatoid arthritis of wrist (HCC) 05/21/2016  . Hypercholesteremia 04/09/2016  . Avitaminosis D 04/09/2016  . 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 Vitamin B deficiency 07/12/2010  . Anxiety, generalized 06/29/2010  . Essential (primary) hypertension 05/02/2008  . Adult hypothyroidism 02/10/2008  . Clinical depression 03/17/2007  . Allergic rhinitis 11/04/2005  . Arthritis or polyarthritis, rheumatoid (HCC) 11/04/2005  . Herpes 11/04/2005    Past Medical History:  Diagnosis Date  . Arthritis   . Depression   . GERD (gastroesophageal reflux disease)   . History of deep vein thrombosis    Right calf  . History of peritonitis 2002?   Right calf.  . Hypertension   . PONV (postoperative nausea and vomiting)   . Thyroid disease     Social History   Social History  . Marital status: Single    Spouse name: N/A  . Number of children: N/A  . Years of education: N/A   Occupational History  . Not on file.   Social History Main Topics  . Smoking status: Former Smoker    Types: Cigarettes  . Smokeless tobacco: Never Used  . Alcohol use Yes     Comment: Occasional  . Drug use: No  . Sexual activity: No   Other Topics Concern  . Not on file   Social History Narrative  . No narrative on file    Outpatient Encounter Prescriptions as  of 06/18/2017  Medication Sig  . cholecalciferol (VITAMIN D) 1000 units tablet Take 1,000 Units by mouth daily.   . DULoxetine (CYMBALTA) 30 MG capsule TAKE 1 CAPSULE BY MOUTH EVERY DAY  . gabapentin (NEURONTIN) 300 MG capsule Start with 300 mg twice per day tomorrow then 300mg  three times per day  . levothyroxine (SYNTHROID, LEVOTHROID) 75 MCG tablet TAKE 1 TABLET (75 MCG TOTAL) BY MOUTH DAILY.  omeprazole (PRILOSEC) 20 MG capsule TAKE 1 CAPSULE(20 MG) BY MOUTH DAILY  . predniSONE (DELTASONE) 5 MG tablet Take 5 mg by mouth daily with breakfast.  . triamterene-hydrochlorothiazide (MAXZIDE-25) 37.5-25 MG tablet TAKE 1 TABLET EVERY DAY  . baclofen (LIORESAL) 10 MG tablet TAKE 1 TABLET(10 MG) BY MOUTH THREE TIMES DAILY (Patient not taking: Reported on 05/07/2017)  . [DISCONTINUED] ondansetron (ZOFRAN) 4 MG tablet Take 1 tablet (4 mg total) by mouth every 8 (eight) hours as needed for nausea or vomiting.   No facility-administered encounter medications on file as of 06/18/2017.     Allergies  Allergen Reactions  . Remicade [Infliximab] Anaphylaxis  . Shellfish-Derived Products Rash and Swelling  . Ciprofloxacin   . Demerol [Meperidine] Nausea And Vomiting  . Nsaids   . Tolmetin   . Aspirin Rash  . Clinoril [Sulindac] Rash  . Codeine Nausea And Vomiting    Severe vomiting  . Methotrexate Nausea And Vomiting  and Rash  . Methotrexate Derivatives Rash and Cough  . Penicillins Rash  . Shellfish Allergy Swelling and Rash    Review of Systems  Constitutional: Negative.   Respiratory: Negative.   Cardiovascular: Negative.   Gastrointestinal: Negative.   Musculoskeletal: Positive for back pain and myalgias.       Better  Skin:       Scar discoloration left from the shingels break out  Endo/Heme/Allergies: Negative.   Psychiatric/Behavioral: Negative.     Objective:  BP 104/82   Pulse (!) 108   Temp 98.3 F (36.8 C)   Resp 18   Wt 196 lb (88.9 kg)   SpO2 95%   BMI 31.64 kg/m    Physical Exam  Constitutional: She is oriented to person, place, and time and well-developed, well-nourished, and in no distress.  HENT:  Head: Normocephalic and atraumatic.  Right Ear: External ear normal.  Left Ear: External ear normal.  Nose: Nose normal.  Eyes: Conjunctivae are normal. Pupils are equal, round, and reactive to light.  Neck: Normal range of motion. Neck supple.  Cardiovascular: Normal rate, regular rhythm, normal heart sounds and intact distal pulses.   Pulmonary/Chest: Effort normal and breath sounds normal. No respiratory distress. She has no wheezes.  Abdominal: Soft.  Neurological: She is alert and oriented to person, place, and time. Gait normal.  Skin: Skin is warm and dry.  Psychiatric: Mood, memory, affect and judgment normal.   Assessment and Plan :  1. Herpes zoster without complication Healing well.  2. Postherpetic neuralgia Much better on Gabapentin higher dose. Refill provided today. Follow.  3. Unintentional weight loss Improved.  4. Essential hypertension Stop Maxzide for now. Patient has not been taking full dose the past few days and has been staying more active. Will re check b/p on the next visit.  5. Adult hypothyroidism  HPI, Exam and A&P transcribed by Samara Deist, RMA under direction and in the presence of [name], MD. I have done the exam and reviewed the chart and it is accurate to the best of my knowledge. Dentist has been used and  any errors in dictation or transcription are unintentional. [name] M.D. [facility]

## 2017-06-21 ENCOUNTER — Other Ambulatory Visit: Payer: Self-pay | Admitting: Family Medicine

## 2017-07-16 ENCOUNTER — Ambulatory Visit (INDEPENDENT_AMBULATORY_CARE_PROVIDER_SITE_OTHER): Payer: Medicare PPO | Admitting: Physician Assistant

## 2017-07-16 ENCOUNTER — Encounter: Payer: Self-pay | Admitting: Physician Assistant

## 2017-07-16 VITALS — BP 136/88 | HR 80 | Resp 16 | Wt 194.0 lb

## 2017-07-16 DIAGNOSIS — R21 Rash and other nonspecific skin eruption: Secondary | ICD-10-CM | POA: Diagnosis not present

## 2017-07-16 DIAGNOSIS — M069 Rheumatoid arthritis, unspecified: Secondary | ICD-10-CM | POA: Diagnosis not present

## 2017-07-16 NOTE — Progress Notes (Signed)
Patient: Angela Sherman Female    DOB: Aug 24, 1951   66 y.o.   MRN: [id number] Visit Date: 07/16/2017  Today's Provider: Trey Sailors, PA-C   Chief Complaint  Patient presents with  . Rash    Started last week; pt is concern for vasculitis.    Subjective:      Angela Sherman is a 66 y/o woman with longstanding history of rheumatoid arthritis with prior treatments including Remicade and methotrexate presenting today with rash. She says it started in the past week suddenly, shows me picture on her phone. The rash was on her anterior lower extremities. NO weeping, pus. Rash itched and burned. It has improved now. She did not have any pain. When she touched it with her finger, it did not blanch. No history of tick bite or bloody dyscrasias. No history of liver problems. No known exposure to hepatitis including needle sticks, tattoo, sexual exposures.  Rash  This is a new problem. The current episode started in the past 7 days. The problem has been gradually improving since onset. The affected locations include the right lower leg. The rash is characterized by itchiness and burning. She was exposed to nothing. Pertinent negatives include no rhinorrhea.       Allergies  Allergen Reactions  . Remicade [Infliximab] Anaphylaxis  . Shellfish-Derived Products Rash and Swelling  . Ciprofloxacin   . Demerol [Meperidine] Nausea And Vomiting  . Nsaids   . Tolmetin   . Aspirin Rash  . Clinoril [Sulindac] Rash  . Codeine Nausea And Vomiting    Severe vomiting  . Methotrexate Nausea And Vomiting and Rash  . Methotrexate Derivatives Rash and Cough  . Penicillins Rash  . Shellfish Allergy Swelling and Rash     Current Outpatient Prescriptions:  .  cholecalciferol (VITAMIN D) 1000 units tablet, Take 1,000 Units by mouth daily. , Disp: , Rfl:  .  DULoxetine (CYMBALTA) 30 MG capsule, TAKE 1 CAPSULE BY MOUTH EVERY DAY, Disp: 30 capsule, Rfl: 11 .  gabapentin (NEURONTIN) 300 MG capsule, 1  tablet three times daily, Disp: 270 capsule, Rfl: 3 .  levothyroxine (SYNTHROID, LEVOTHROID) 75 MCG tablet, TAKE 1 TABLET (75 MCG TOTAL) BY MOUTH DAILY., Disp: 90 tablet, Rfl: 3 .  omeprazole (PRILOSEC) 20 MG capsule, TAKE 1 CAPSULE(20 MG) BY MOUTH DAILY, Disp: 30 capsule, Rfl: 11 .  predniSONE (DELTASONE) 5 MG tablet, Take 5 mg by mouth daily with breakfast., Disp: , Rfl:  .  baclofen (LIORESAL) 10 MG tablet, TAKE 1 TABLET(10 MG) BY MOUTH THREE TIMES DAILY (Patient not taking: Reported on 05/07/2017), Disp: 270 tablet, Rfl: 0  Review of Systems  Constitutional: Negative.   HENT: Negative for rhinorrhea.   Skin: Positive for rash. Negative for color change, pallor and wound.    Social History  Substance Use Topics  . Smoking status: Former Smoker    Types: Cigarettes  . Smokeless tobacco: Never Used  . Alcohol use Yes     Comment: Occasional   Objective:   BP 136/88 (BP Location: Left Arm, Patient Position: Sitting, Cuff Size: Large)   Pulse 80   Resp 16   Wt 194 lb (88 kg)   SpO2 98%   BMI 31.31 kg/m  Vitals:   07/16/17 1403  BP: 136/88  Pulse: 80  Resp: 16  SpO2: 98%  Weight: 194 lb (88 kg)     Physical Exam  Constitutional: She is oriented to person, place, and time. She appears well-developed  and well-nourished.  Cardiovascular: Normal rate.   Pulses:      Dorsalis pedis pulses are 2+ on the right side, and 2+ on the left side.       Posterior tibial pulses are 2+ on the right side, and 2+ on the left side.  Neurological: She is alert and oriented to person, place, and time.  Skin: Skin is warm and dry. There is erythema.  Remnant of erythema on anterior lower extremities. Patient's picture shows petechial ertyhematous rash on lower extremities. Skin is warm and well perfused today.  Psychiatric: She has a normal mood and affect. Her behavior is normal.        Assessment & Plan:     1. Rash  Rash has resolved now. I have reviewed patient's CBC w. Differential  from 04/2017 and it was normal, no thrombocytopenia, eosinophilia. CMET is also normal with preserved liver and kidney function. No history of tick bite or symptoms of tick borne illness. Patient does have rheumatologist, suggest if this recurs she follow up with them. Mainly patient just wanted Dr. Sullivan Lone to know about this.  2. Rheumatoid arthritis of wrist, unspecified laterality, unspecified rheumatoid factor presence (HCC)  Stable, sees rheumatology.  Return if symptoms worsen or fail to improve.  The entirety of the information documented in the History of Present Illness, Review of Systems and Physical Exam were personally obtained by me. Portions of this information were initially documented by Kavin Leech, CMA and reviewed by me for thoroughness and accuracy.        Trey Sailors, PA-C  [facility]

## 2017-07-16 NOTE — Patient Instructions (Signed)

## 2017-07-21 ENCOUNTER — Ambulatory Visit: Payer: Medicare PPO | Admitting: Family Medicine

## 2017-09-18 ENCOUNTER — Ambulatory Visit (INDEPENDENT_AMBULATORY_CARE_PROVIDER_SITE_OTHER): Payer: Medicare PPO | Admitting: Family Medicine

## 2017-09-18 ENCOUNTER — Ambulatory Visit (INDEPENDENT_AMBULATORY_CARE_PROVIDER_SITE_OTHER): Payer: Medicare PPO

## 2017-09-18 VITALS — BP 134/84 | HR 72 | Temp 97.6°F | Ht 66.0 in | Wt 197.0 lb

## 2017-09-18 VITALS — BP 134/84 | HR 72 | Temp 97.6°F | Ht 66.0 in | Wt 197.6 lb

## 2017-09-18 DIAGNOSIS — B0229 Other postherpetic nervous system involvement: Secondary | ICD-10-CM

## 2017-09-18 DIAGNOSIS — Z23 Encounter for immunization: Secondary | ICD-10-CM | POA: Diagnosis not present

## 2017-09-18 DIAGNOSIS — Z0000 Encounter for general adult medical examination without abnormal findings: Secondary | ICD-10-CM | POA: Diagnosis not present

## 2017-09-18 DIAGNOSIS — Z5320 Procedure and treatment not carried out because of patient's decision for unspecified reasons: Secondary | ICD-10-CM | POA: Diagnosis not present

## 2017-09-18 DIAGNOSIS — I10 Essential (primary) hypertension: Secondary | ICD-10-CM

## 2017-09-18 DIAGNOSIS — Z1211 Encounter for screening for malignant neoplasm of colon: Secondary | ICD-10-CM

## 2017-09-18 NOTE — Progress Notes (Signed)
Subjective:   Angela Sherman is a 66 y.o. female who presents for Medicare Annual (Subsequent) preventive examination.  Review of Systems:   N/A  Cardiac Risk Factors include: advanced age (>66men, >33 women);dyslipidemia;hypertension;obesity (BMI >30kg/m2)     Objective:     Vitals: BP 134/84 (BP Location: Left Arm)   Pulse 72   Temp 97.6 F (36.4 C) (Oral)   Ht 5\' 6"  (1.676 m)   Wt 197 lb 9.6 oz (89.6 kg)   BMI 31.89 kg/m   Body mass index is 31.89 kg/m.   Tobacco History  Smoking Status  . Former Smoker  . Types: Cigarettes  Smokeless Tobacco  . Never Used     Counseling given: Not Answered   Past Medical History:  Diagnosis Date  . Arthritis   . Depression   . GERD (gastroesophageal reflux disease)   . History of deep vein thrombosis    Right calf  . History of peritonitis 2002?   Right calf.  . Hypertension   . PONV (postoperative nausea and vomiting)   . Thyroid disease    Past Surgical History:  Procedure Laterality Date  . APPENDECTOMY  2001   Laparoscopy, Dx-Peritontis  . CHOLECYSTECTOMY N/A 06/04/2016   Procedure: LAPAROSCOPIC CHOLECYSTECTOMY WITH INTRAOPERATIVE CHOLANGIOGRAM;  Surgeon: 08/04/2016, MD;  Location: [facility] ORS;  Service: General;  Laterality: N/A;  . CHOLECYSTECTOMY  06/04/2016   Procedure: CHOLECYSTECTOMY;  Surgeon: 08/04/2016, MD;  Location: [facility] ORS;  Service: General;;  Laparoscopic converted to open  . DIAGNOSTIC LAPAROSCOPY    . KNEE SURGERY Right   . Reconstruction of the right hand  2006   surgery  . WRIST SURGERY Left 1989   Dx-RA   Family History  Problem Relation Age of Onset  . Hypertension Brother    History  Sexual Activity  . Sexual activity: No    Outpatient Encounter Prescriptions as of 09/18/2017  Medication Sig  . cholecalciferol (VITAMIN D) 1000 units tablet Take 1,000 Units by mouth daily.   . DULoxetine (CYMBALTA) 30 MG capsule TAKE 1 CAPSULE BY MOUTH EVERY DAY  . gabapentin (NEURONTIN)  100 MG capsule Take 100 mg by mouth at bedtime.  09/20/2017 levothyroxine (SYNTHROID, LEVOTHROID) 75 MCG tablet TAKE 1 TABLET (75 MCG TOTAL) BY MOUTH DAILY.  [name] omeprazole (PRILOSEC) 20 MG capsule TAKE 1 CAPSULE(20 MG) BY MOUTH DAILY  . predniSONE (DELTASONE) 5 MG tablet Take 5 mg by mouth daily with breakfast.  . triamterene-hydrochlorothiazide (MAXZIDE-25) 37.5-25 MG tablet Take 1 tablet by mouth daily.   . [DISCONTINUED] baclofen (LIORESAL) 10 MG tablet TAKE 1 TABLET(10 MG) BY MOUTH THREE TIMES DAILY (Patient not taking: Reported on 05/07/2017)  . [DISCONTINUED] gabapentin (NEURONTIN) 300 MG capsule 1 tablet three times daily   No facility-administered encounter medications on file as of 09/18/2017.     Activities of Daily Living In your present state of health, do you have any difficulty performing the following activities: 09/18/2017  Hearing? N  Vision? N  Difficulty concentrating or making decisions? N  Walking or climbing stairs? Y  Comment due to RA pains  Dressing or bathing? N  Doing errands, shopping? N  Preparing Food and eating ? N  Using the Toilet? N  In the past six months, have you accidently leaked urine? Y  Comment occasionally, wears pads  Do you have problems with loss of bowel control? N  Managing your Medications? N  Managing your Finances? N  Housekeeping or managing your Housekeeping? N  Some recent data might be hidden    Patient Care Team: Maple Hudson., MD as PCP - General (Family Medicine) Galen Manila, MD as Referring Physician (Ophthalmology) Denyce Robert, MD as Referring Physician (Internal Medicine)    Assessment:     Exercise Activities and Dietary recommendations Current Exercise Habits: Home exercise routine, Type of exercise: walking (nonstrenuous), Time (Minutes): 30, Frequency (Times/Week): 6, Weekly Exercise (Minutes/Week): 180, Intensity: Mild, Exercise limited by: Other - see comments (RA)  Goals    . Eat more fruits and vegetables           Recommend eating more fruits and vegetables in daily diet (at least 2 servings a day).      Fall Risk Fall Risk  09/18/2017  Falls in the past year? No   Depression Screen PHQ 2/9 Scores 09/18/2017 09/18/2017  PHQ - 2 Score 0 0  PHQ- 9 Score 6 -     Cognitive Function- Pt declined screening today.        Immunization History  Administered Date(s) Administered  . Influenza,inj,Quad PF,6+ Mos 12/03/2016, 09/18/2017  . Influenza-Unspecified 10/20/2015  . Pneumococcal Conjugate-13 07/26/2014  . Pneumococcal Polysaccharide-23 10/24/2009, 01/10/2015  . Tdap 11/03/2007, 12/09/2011   Screening Tests Health Maintenance  Topic Date Due  . Hepatitis C Screening  [birth year]  . HIV Screening  07/28/1968  . PAP SMEAR  07/28/1974  . MAMMOGRAM  07/29/2003  . COLONOSCOPY  07/29/2003  . TETANUS/TDAP  12/08/2021  . INFLUENZA VACCINE  Completed      Plan:  I have personally reviewed and addressed the Medicare Annual Wellness questionnaire and have noted the following in the patient's chart:  A. Medical and social history B. Use of alcohol, tobacco or illicit drugs  C. Current medications and supplements D. Functional ability and status E.  Nutritional status F.  Physical activity G. Advance directives H. List of other physicians I.  Hospitalizations, surgeries, and ER visits in previous 12 months J.  Vitals K. Screenings such as hearing and vision if needed, cognitive and depression L. Referrals and appointments - none  In addition, I have reviewed and discussed with patient certain preventive protocols, quality metrics, and best practice recommendations. A written personalized care plan for preventive services as well as general preventive health recommendations were provided to patient.  See attached scanned questionnaire for additional information.   Signed,  Hyacinth Meeker, LPN Nurse Health Advisor   MD Recommendations: None. Pt declined mammogram, colonoscopy  referral, pap smear, Hep C screening and HIV screening today.

## 2017-09-18 NOTE — Patient Instructions (Signed)
[name] , Thank you for taking time to come for your Medicare Wellness Visit. I appreciate your ongoing commitment to your health goals. Please review the following plan we discussed and let me know if I can assist you in the future.   Screening recommendations/referrals: Colonoscopy: declined Mammogram: declined Bone Density: up to date Recommended yearly ophthalmology/optometry visit for glaucoma screening and checkup Recommended yearly dental visit for hygiene and checkup  Vaccinations: Influenza vaccine: completed today Pneumococcal vaccine: N/A Tdap vaccine: up to date Shingles vaccine: declined today, will check with insurance first  Advanced directives: Please bring a copy of your POA (Power of [place]) and/or Living Will to your next appointment.   Conditions/risks identified: Obesity; Recommend eating more fruits and vegetables in daily diet (at least 2 servings a day).  Next appointment: 1:30 PM today with PCP  Preventive Care 40-64 Years, Female Preventive care refers to lifestyle choices and visits with your health care provider that can promote health and wellness. What does preventive care include?  A yearly physical exam. This is also called an annual well check.  Dental exams once or twice a year.  Routine eye exams. Ask your health care provider how often you should have your eyes checked.  Personal lifestyle choices, including:  Daily care of your teeth and gums.  Regular physical activity.  Eating a healthy diet.  Avoiding tobacco and drug use.  Limiting alcohol use.  Practicing safe sex.  Taking low-dose aspirin daily starting at age 22.  Taking vitamin and mineral supplements as recommended by your health care provider. What happens during an annual well check? The services and screenings done by your health care provider during your annual well check will depend on your age, overall health, lifestyle risk factors, and family history of  disease. Counseling  Your health care provider may ask you questions about your:  Alcohol use.  Tobacco use.  Drug use.  Emotional well-being.  Home and relationship well-being.  Sexual activity.  Eating habits.  Work and work Statistician.  Method of birth control.  Menstrual cycle.  Pregnancy history. Screening  You may have the following tests or measurements:  Height, weight, and BMI.  Blood pressure.  Lipid and cholesterol levels. These may be checked every 5 years, or more frequently if you are over 42 years old.  Skin check.  Lung cancer screening. You may have this screening every year starting at age 36 if you have a 30-pack-year history of smoking and currently smoke or have quit within the past 15 years.  Fecal occult blood test (FOBT) of the stool. You may have this test every year starting at age 15.  Flexible sigmoidoscopy or colonoscopy. You may have a sigmoidoscopy every 5 years or a colonoscopy every 10 years starting at age 12.  Hepatitis C blood test.  Hepatitis B blood test.  Sexually transmitted disease (STD) testing.  Diabetes screening. This is done by checking your blood sugar (glucose) after you have not eaten for a while (fasting). You may have this done every 1-3 years.  Mammogram. This may be done every 1-2 years. Talk to your health care provider about when you should start having regular mammograms. This may depend on whether you have a family history of breast cancer.  BRCA-related cancer screening. This may be done if you have a family history of breast, ovarian, tubal, or peritoneal cancers.  Pelvic exam and Pap test. This may be done every 3 years starting at age 36. Starting at  age 100, this may be done every 5 years if you have a Pap test in combination with an HPV test.  Bone density scan. This is done to screen for osteoporosis. You may have this scan if you are at high risk for osteoporosis. Discuss your test results,  treatment options, and if necessary, the need for more tests with your health care provider. Vaccines  Your health care provider may recommend certain vaccines, such as:  Influenza vaccine. This is recommended every year.  Tetanus, diphtheria, and acellular pertussis (Tdap, Td) vaccine. You may need a Td booster every 10 years.  Zoster vaccine. You may need this after age 21.  Pneumococcal 13-valent conjugate (PCV13) vaccine. You may need this if you have certain conditions and were not previously vaccinated.  Pneumococcal polysaccharide (PPSV23) vaccine. You may need one or two doses if you smoke cigarettes or if you have certain conditions. Talk to your health care provider about which screenings and vaccines you need and how often you need them. This information is not intended to replace advice given to you by your health care provider. Make sure you discuss any questions you have with your health care provider. Document Released: 01/12/2016 Document Revised: 09/04/2016 Document Reviewed: 10/17/2015 Elsevier Interactive Patient Education  2017 [place] Prevention in the Home Falls can cause injuries. They can happen to people of all ages. There are many things you can do to make your home safe and to help prevent falls. What can I do on the outside of my home?  Regularly fix the edges of walkways and driveways and fix any cracks.  Remove anything that might make you trip as you walk through a door, such as a raised step or threshold.  Trim any bushes or trees on the path to your home.  Use bright outdoor lighting.  Clear any walking paths of anything that might make someone trip, such as rocks or tools.  Regularly check to see if handrails are loose or broken. Make sure that both sides of any steps have handrails.  Any raised decks and porches should have guardrails on the edges.  Have any leaves, snow, or ice cleared regularly.  Use sand or salt on walking  paths during winter.  Clean up any spills in your garage right away. This includes oil or grease spills. What can I do in the bathroom?  Use night lights.  Install grab bars by the toilet and in the tub and shower. Do not use towel bars as grab bars.  Use non-skid mats or decals in the tub or shower.  If you need to sit down in the shower, use a plastic, non-slip stool.  Keep the floor dry. Clean up any water that spills on the floor as soon as it happens.  Remove soap buildup in the tub or shower regularly.  Attach bath mats securely with double-sided non-slip rug tape.  Do not have throw rugs and other things on the floor that can make you trip. What can I do in the bedroom?  Use night lights.  Make sure that you have a light by your bed that is easy to reach.  Do not use any sheets or blankets that are too big for your bed. They should not hang down onto the floor.  Have a firm chair that has side arms. You can use this for support while you get dressed.  Do not have throw rugs and other things on the floor that  can make you trip. What can I do in the kitchen?  Clean up any spills right away.  Avoid walking on wet floors.  Keep items that you use a lot in easy-to-reach places.  If you need to reach something above you, use a strong step stool that has a grab bar.  Keep electrical cords out of the way.  Do not use floor polish or wax that makes floors slippery. If you must use wax, use non-skid floor wax.  Do not have throw rugs and other things on the floor that can make you trip. What can I do with my stairs?  Do not leave any items on the stairs.  Make sure that there are handrails on both sides of the stairs and use them. Fix handrails that are broken or loose. Make sure that handrails are as long as the stairways.  Check any carpeting to make sure that it is firmly attached to the stairs. Fix any carpet that is loose or worn.  Avoid having throw rugs at  the top or bottom of the stairs. If you do have throw rugs, attach them to the floor with carpet tape.  Make sure that you have a light switch at the top of the stairs and the bottom of the stairs. If you do not have them, ask someone to add them for you. What else can I do to help prevent falls?  Wear shoes that:  Do not have high heels.  Have rubber bottoms.  Are comfortable and fit you well.  Are closed at the toe. Do not wear sandals.  If you use a stepladder:  Make sure that it is fully opened. Do not climb a closed stepladder.  Make sure that both sides of the stepladder are locked into place.  Ask someone to hold it for you, if possible.  Clearly mark and make sure that you can see:  Any grab bars or handrails.  First and last steps.  Where the edge of each step is.  Use tools that help you move around (mobility aids) if they are needed. These include:  Canes.  Walkers.  Scooters.  Crutches.  Turn on the lights when you go into a dark area. Replace any light bulbs as soon as they burn out.  Set up your furniture so you have a clear path. Avoid moving your furniture around.  If any of your floors are uneven, fix them.  If there are any pets around you, be aware of where they are.  Review your medicines with your doctor. Some medicines can make you feel dizzy. This can increase your chance of falling. Ask your doctor what other things that you can do to help prevent falls. This information is not intended to replace advice given to you by your health care provider. Make sure you discuss any questions you have with your health care provider. Document Released: 10/12/2009 Document Revised: 05/23/2016 Document Reviewed: 01/20/2015 Elsevier Interactive Patient Education  2017 [employer].

## 2017-09-18 NOTE — Progress Notes (Signed)
Angela Sherman  MRN: [id number] DOB: 1951-08-28  Subjective:  HPI  Patient is here for 3 months follow up. On 06/18/17 visit for B/P stopped Maxzide and were going to follow. Patient ended up re starting Maxzide due to her ankles started to swell.  BP Readings from Last 3 Encounters:  09/18/17 134/84  09/18/17 134/84  07/16/17 136/88   Wt Readings from Last 3 Encounters:  09/18/17 197 lb (89.4 kg)  09/18/17 197 lb 9.6 oz (89.6 kg)  07/16/17 194 lb (88 kg)   Patient is taking Gabapentin 100 mg at bedtime only now for postherpetic neuralgia.   Patient Active Problem List   Diagnosis Date Noted  . Cholecystitis with cholelithiasis 06/04/2016  . Gallstones without obstruction of gallbladder 05/21/2016  . Rheumatoid arthritis of wrist (HCC) 05/21/2016  . Hypercholesteremia 04/09/2016  . Avitaminosis D 04/09/2016  . 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 Vitamin B deficiency 07/12/2010  . Anxiety, generalized 06/29/2010  . Essential (primary) hypertension 05/02/2008  . Adult hypothyroidism 02/10/2008  . Clinical depression 03/17/2007  . Allergic rhinitis 11/04/2005  . Arthritis or polyarthritis, rheumatoid (HCC) 11/04/2005  . Herpes 11/04/2005    Past Medical History:  Diagnosis Date  . Arthritis   . Depression   . GERD (gastroesophageal reflux disease)   . History of deep vein thrombosis    Right calf  . History of peritonitis 2002?   Right calf.  . Hypertension   . PONV (postoperative nausea and vomiting)   . Thyroid disease     Social History   Social History  . Marital status: Single    Spouse name: N/A  . Number of children: N/A  . Years of education: N/A   Occupational History  . Not on file.   Social History Main Topics  . Smoking status: Former Smoker    Types: Cigarettes  . Smokeless tobacco: Never Used  . Alcohol use Yes     Comment: Occasional, maybe 1-2 a month  . Drug use: No  . Sexual activity: No   Other Topics Concern  . Not on file    Social History Narrative  . No narrative on file    Outpatient Encounter Prescriptions as of 09/18/2017  Medication Sig  . cholecalciferol (VITAMIN D) 1000 units tablet Take 1,000 Units by mouth daily.   . DULoxetine (CYMBALTA) 30 MG capsule TAKE 1 CAPSULE BY MOUTH EVERY DAY  . gabapentin (NEURONTIN) 100 MG capsule Take 100 mg by mouth at bedtime.  [name] levothyroxine (SYNTHROID, LEVOTHROID) 75 MCG tablet TAKE 1 TABLET (75 MCG TOTAL) BY MOUTH DAILY.  [name] omeprazole (PRILOSEC) 20 MG capsule TAKE 1 CAPSULE(20 MG) BY MOUTH DAILY  . predniSONE (DELTASONE) 5 MG tablet Take 5 mg by mouth daily with breakfast.  . triamterene-hydrochlorothiazide (MAXZIDE-25) 37.5-25 MG tablet Take 1 tablet by mouth daily.    No facility-administered encounter medications on file as of 09/18/2017.     Allergies  Allergen Reactions  . Remicade [Infliximab] Anaphylaxis  . Shellfish-Derived Products Rash and Swelling  . Ciprofloxacin   . Demerol [Meperidine] Nausea And Vomiting  . Nsaids   . Tolmetin   . Aspirin Rash  . Clinoril [Sulindac] Rash  . Codeine Nausea And Vomiting    Severe vomiting  . Methotrexate Nausea And Vomiting and Rash  . Methotrexate Derivatives Rash and Cough  . Penicillins Rash  . Shellfish Allergy Swelling and Rash    Review of Systems  Constitutional: Negative.   Respiratory: Negative.   Cardiovascular: Negative.  Gastrointestinal: Negative.   Musculoskeletal: Positive for joint pain.       Stiffness, joint swelling  Neurological: Negative.   Psychiatric/Behavioral: Negative.     Objective:  BP 134/84   Pulse 72   Temp 97.6 F (36.4 C)   Ht 5\' 6"  (1.676 m)   Wt 197 lb (89.4 kg)   BMI 31.80 kg/m   Physical Exam  Constitutional: She is oriented to person, place, and time and well-developed, well-nourished, and in no distress.  HENT:  Head: Normocephalic and atraumatic.  Eyes: Pupils are equal, round, and reactive to light. Conjunctivae are normal.  Cardiovascular:  Normal rate, regular rhythm, normal heart sounds and intact distal pulses.  Exam reveals no gallop.   No murmur heard. Pulmonary/Chest: Effort normal and breath sounds normal. No respiratory distress. She has no wheezes.  Neurological: She is alert and oriented to person, place, and time.  Psychiatric: Mood, memory, affect and judgment normal.   Assessment and Plan :  1. Postherpetic neuralgia Stable. Follow as needed.  2. Essential hypertension Stable. continue current medication. Maxzide helps with edema also. Will check Lipids on the next visit and possibly other lab work. 3. Breast screening declined Patient declined mammogram.  4. Pap smear of cervix declined Patient declined.  5. Colonoscopy refused Patient declined. Discussed Cologuard option. Patient agrees to getting this done, order placed.  HPI, Exam and A&P transcribed by , RMA under direction and in the presence of Domingo Cocking, MD. I have done the exam and reviewed the chart and it is accurate to the best of my knowledge. [name] has been used and  any errors in dictation or transcription are unintentional. Dentist M.D. [facility]

## 2017-09-19 ENCOUNTER — Telehealth: Payer: Self-pay | Admitting: Family Medicine

## 2017-09-19 NOTE — Telephone Encounter (Signed)
Order for cologuard faxed to [facility] °

## 2017-09-29 NOTE — Telephone Encounter (Signed)
AWV cmpleted.

## 2017-10-14 MED ORDER — PREDNISONE 5 MG TABLET
ORAL_TABLET | 3 refills | 0 days | Status: CP
Start: 2017-10-14 — End: 2018-11-10

## 2017-11-13 ENCOUNTER — Ambulatory Visit: Payer: Medicare PPO | Admitting: Physician Assistant

## 2017-11-13 ENCOUNTER — Encounter: Payer: Self-pay | Admitting: Physician Assistant

## 2017-11-13 VITALS — BP 110/70 | HR 115 | Temp 98.5°F | Resp 16 | Wt 197.0 lb

## 2017-11-13 DIAGNOSIS — R062 Wheezing: Secondary | ICD-10-CM | POA: Diagnosis not present

## 2017-11-13 DIAGNOSIS — R05 Cough: Secondary | ICD-10-CM | POA: Diagnosis not present

## 2017-11-13 DIAGNOSIS — R059 Cough, unspecified: Secondary | ICD-10-CM

## 2017-11-13 MED ORDER — DOXYCYCLINE HYCLATE 100 MG PO TABS: 100.0000 mg | ORAL_TABLET | Freq: Two times a day (BID) | ORAL | 0 refills | Status: AC

## 2017-11-13 NOTE — Progress Notes (Signed)
Patient: Angela Sherman Female    DOB: 1951-06-12   66 y.o.   MRN: [id number] Visit Date: 11/13/2017 Dictation #1 [id number]  [id number]  Today's Provider: Trey Sailors, PA-C   Chief Complaint  Patient presents with  . Cough   Subjective:    Angela Sherman is a 66 y/o woman with a history of rheumatoid arthritis on 5 mg prednisone daily who presents today with a cough x 6 weeks.   Patient has had a cough for 6 weeks. Patient stated that symptoms started with URI. Now she has productive cough, slight sore throat, sob. Patient has been taking otc cough drops.    Cough  This is a new problem. The current episode started more than 1 month ago (6 weeks). The problem has been unchanged. The problem occurs constantly. The cough is productive of sputum. Associated symptoms include a rash, a sore throat and shortness of breath. Pertinent negatives include no chest pain, chills, ear congestion, ear pain, fever, headaches, heartburn, hemoptysis, myalgias, nasal congestion, postnasal drip, rhinorrhea, sweats, weight loss or wheezing.       Allergies  Allergen Reactions  . Remicade [Infliximab] Anaphylaxis  . Shellfish-Derived Products Rash and Swelling  . Ciprofloxacin   . Demerol [Meperidine] Nausea And Vomiting  . Nsaids   . Tolmetin   . Aspirin Rash  . Clinoril [Sulindac] Rash  . Codeine Nausea And Vomiting    Severe vomiting  . Methotrexate Nausea And Vomiting and Rash  . Methotrexate Derivatives Rash and Cough  . Penicillins Rash  . Shellfish Allergy Swelling and Rash     Current Outpatient Medications:  .  cholecalciferol (VITAMIN D) 1000 units tablet, Take 1,000 Units by mouth daily. , Disp: , Rfl:  .  DULoxetine (CYMBALTA) 30 MG capsule, TAKE 1 CAPSULE BY MOUTH EVERY DAY, Disp: 30 capsule, Rfl: 11 .  levothyroxine (SYNTHROID, LEVOTHROID) 75 MCG tablet, TAKE 1 TABLET (75 MCG TOTAL) BY MOUTH DAILY., Disp: 90 tablet, Rfl: 3 .  omeprazole (PRILOSEC) 20 MG  capsule, TAKE 1 CAPSULE(20 MG) BY MOUTH DAILY, Disp: 30 capsule, Rfl: 11 .  predniSONE (DELTASONE) 5 MG tablet, Take 5 mg by mouth daily with breakfast., Disp: , Rfl:  .  triamterene-hydrochlorothiazide (MAXZIDE-25) 37.5-25 MG tablet, Take 1 tablet by mouth daily. , Disp: , Rfl:  .  doxycycline (VIBRA-TABS) 100 MG tablet, Take 1 tablet (100 mg total) 2 (two) times daily for 7 days by mouth., Disp: 14 tablet, Rfl: 0 .  gabapentin (NEURONTIN) 100 MG capsule, Take 100 mg by mouth at bedtime., Disp: , Rfl:   Review of Systems  Constitutional: Negative for appetite change, chills, fatigue, fever and weight loss.  HENT: Positive for sore throat. Negative for ear pain, postnasal drip and rhinorrhea.   Respiratory: Positive for cough and shortness of breath. Negative for hemoptysis, chest tightness and wheezing.   Cardiovascular: Negative for chest pain and palpitations.  Gastrointestinal: Negative for abdominal pain, heartburn, nausea and vomiting.  Musculoskeletal: Negative for myalgias.  Skin: Positive for rash.  Neurological: Negative for dizziness, weakness and headaches.    Social History   Tobacco Use  . Smoking status: Former Smoker    Types: Cigarettes  . Smokeless tobacco: Never Used  Substance Use Topics  . Alcohol use: Yes    Comment: Occasional, maybe 1-2 a month   Objective:   BP 110/70 (BP Location: Right Arm, Patient Position: Sitting, Cuff Size: Large)   Pulse (!) 115   Temp  98.5 F (36.9 C) (Oral)   Resp 16   Wt 197 lb (89.4 kg)   SpO2 97%   BMI 31.80 kg/m  Vitals:   11/13/17 1343  BP: 110/70  Pulse: (!) 115  Resp: 16  Temp: 98.5 F (36.9 C)  TempSrc: Oral  SpO2: 97%  Weight: 197 lb (89.4 kg)     Physical Exam  Constitutional: She is oriented to person, place, and time. She appears well-developed and well-nourished.  HENT:  Right Ear: External ear normal.  Left Ear: External ear normal.  Mouth/Throat: Oropharynx is clear and moist. No oropharyngeal  exudate.  Right TM opaque.   Cardiovascular: Normal rate and regular rhythm.  Pulmonary/Chest: Effort normal. She has no wheezes.  Some RLL rales.   Neurological: She is alert and oriented to person, place, and time.  Skin: Skin is warm and dry.  Psychiatric: She has a normal mood and affect. Her behavior is normal.        Assessment & Plan:     1. Cough  Concern for pneumonia vs bronchitis. Cannot exclude malignancy. She declines CXR. I have counseled her that I think this is necessary. She says she will get it if worsening. Counseled on return precautions.   - doxycycline (VIBRA-TABS) 100 MG tablet; Take 1 tablet (100 mg total) 2 (two) times daily for 7 days by mouth.  Dispense: 14 tablet; Refill: 0  2. Wheezing  Return if symptoms worsen or fail to improve.  The entirety of the information documented in the History of Present Illness, Review of Systems and Physical Exam were personally obtained by me. Portions of this information were initially documented by Kavin Leech, CMA and reviewed by me for thoroughness and accuracy.          Trey Sailors, PA-C  [facility]

## 2017-11-13 NOTE — Patient Instructions (Signed)

## 2017-11-18 ENCOUNTER — Other Ambulatory Visit: Payer: Self-pay | Admitting: Family Medicine

## 2017-11-25 ENCOUNTER — Other Ambulatory Visit: Payer: Self-pay | Admitting: Family Medicine

## 2017-11-25 DIAGNOSIS — K219 Gastro-esophageal reflux disease without esophagitis: Secondary | ICD-10-CM

## 2017-12-01 ENCOUNTER — Ambulatory Visit: Payer: Medicare PPO | Admitting: Family Medicine

## 2017-12-01 ENCOUNTER — Other Ambulatory Visit: Payer: Self-pay

## 2017-12-01 VITALS — BP 118/82 | HR 108 | Temp 98.2°F | Resp 18 | Wt 199.0 lb

## 2017-12-01 DIAGNOSIS — M069 Rheumatoid arthritis, unspecified: Secondary | ICD-10-CM

## 2017-12-01 DIAGNOSIS — J4520 Mild intermittent asthma, uncomplicated: Secondary | ICD-10-CM

## 2017-12-01 NOTE — Progress Notes (Signed)
Angela Sherman  MRN: [id number] DOB: 09/21/1951  Subjective:  HPI   The patient is a 66 year old female who presents for follow up of cough and wheezing that she was seen for on 11/13/17.  The PA-C that treated her at the time recommended CXR but patient declined.  Patient is still having trouble and feels she needs to have another round of antibiotics.  Patient Active Problem List   Diagnosis Date Noted  . Cholecystitis with cholelithiasis 06/04/2016  . Gallstones without obstruction of gallbladder 05/21/2016  . Rheumatoid arthritis of wrist (HCC) 05/21/2016  . Hypercholesteremia 04/09/2016  . Avitaminosis D 04/09/2016  . 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 Vitamin B deficiency 07/12/2010  . Anxiety, generalized 06/29/2010  . Essential (primary) hypertension 05/02/2008  . Adult hypothyroidism 02/10/2008  . Clinical depression 03/17/2007  . Allergic rhinitis 11/04/2005  . Arthritis or polyarthritis, rheumatoid (HCC) 11/04/2005  . Herpes 11/04/2005    Past Medical History:  Diagnosis Date  . Arthritis   . Depression   . GERD (gastroesophageal reflux disease)   . History of deep vein thrombosis    Right calf  . History of peritonitis 2002?   Right calf.  . Hypertension   . PONV (postoperative nausea and vomiting)   . Thyroid disease     Social History   Socioeconomic History  . Marital status: Single    Spouse name: Not on file  . Number of children: Not on file  . Years of education: Not on file  . Highest education level: Not on file  Social Needs  . Financial resource strain: Not on file  . Food insecurity - worry: Not on file  . Food insecurity - inability: Not on file  . Transportation needs - medical: Not on file  . Transportation needs - non-medical: Not on file  Occupational History  . Not on file  Tobacco Use  . Smoking status: Former Smoker    Types: Cigarettes  . Smokeless tobacco: Never Used  Substance and Sexual Activity  . Alcohol use: Yes   Comment: Occasional, maybe 1-2 a month  . Drug use: No  . Sexual activity: No  Other Topics Concern  . Not on file  Social History Narrative  . Not on file    Outpatient Encounter Medications as of 12/01/2017  Medication Sig  . cholecalciferol (VITAMIN D) 1000 units tablet Take 1,000 Units by mouth daily.   . DULoxetine (CYMBALTA) 30 MG capsule TAKE 1 CAPSULE BY MOUTH EVERY DAY  . levothyroxine (SYNTHROID, LEVOTHROID) 75 MCG tablet TAKE 1 TABLET (75 MCG TOTAL)  DAILY.  [name] omeprazole (PRILOSEC) 20 MG capsule TAKE 1 CAPSULE(20 MG) BY MOUTH DAILY  . predniSONE (DELTASONE) 5 MG tablet Take 5 mg by mouth daily with breakfast.  . triamterene-hydrochlorothiazide (MAXZIDE-25) 37.5-25 MG tablet Take 1 tablet by mouth daily.   . [DISCONTINUED] gabapentin (NEURONTIN) 100 MG capsule Take 100 mg by mouth at bedtime.   No facility-administered encounter medications on file as of 12/01/2017.     Allergies  Allergen Reactions  . Remicade [Infliximab] Anaphylaxis  . Shellfish-Derived Products Rash and Swelling  . Ciprofloxacin   . Demerol [Meperidine] Nausea And Vomiting  . Nsaids   . Tolmetin   . Aspirin Rash  . Clinoril [Sulindac] Rash  . Codeine Nausea And Vomiting    Severe vomiting  . Methotrexate Nausea And Vomiting and Rash  . Methotrexate Derivatives Rash and Cough  . Penicillins Rash  . Shellfish Allergy Swelling and  Rash    Review of Systems  Constitutional: Positive for malaise/fatigue. Negative for chills, diaphoresis and fever.  HENT: Negative for congestion, ear discharge, ear pain, hearing loss, nosebleeds, sinus pain, sore throat and tinnitus.   Eyes: Negative for blurred vision, double vision, photophobia, pain, discharge and redness.  Respiratory: Positive for cough, sputum production, shortness of breath and wheezing. Negative for hemoptysis.   Cardiovascular: Positive for orthopnea. Negative for chest pain, palpitations, claudication and leg swelling.  Neurological:  Negative for weakness.  Endo/Heme/Allergies: Negative.   Psychiatric/Behavioral: Negative.     Objective:  BP 118/82 (BP Location: Right Arm, Patient Position: Sitting, Cuff Size: Normal)   Pulse (!) 108   Temp 98.2 F (36.8 C) (Oral)   Resp 18   Wt 199 lb (90.3 kg)   SpO2 98%   BMI 32.12 kg/m   Physical Exam  Constitutional: She is oriented to person, place, and time and well-developed, well-nourished, and in no distress.  HENT:  Head: Normocephalic and atraumatic.  Right Ear: External ear normal.  Left Ear: External ear normal.  Nose: Nose normal.  Mouth/Throat: Oropharynx is clear and moist.  Eyes: Conjunctivae are normal. Pupils are equal, round, and reactive to light. No scleral icterus.  Neck: Normal range of motion. No thyromegaly present.  Cardiovascular: Normal rate, regular rhythm and normal heart sounds.  Pulmonary/Chest: Effort normal and breath sounds normal.  Abdominal: Soft.  Musculoskeletal: She exhibits deformity.  Some RA changes of hands.  Lymphadenopathy:    She has no cervical adenopathy.  Neurological: She is alert and oriented to person, place, and time. Gait normal. GCS score is 15.  Skin: Skin is warm and dry.  Psychiatric: Mood, memory, affect and judgment normal.    Assessment and Plan :  Asthmatic Bronchitis Short prednisone taper. RA Pt to start seiing Dr B in future to to access.  I have done the exam and reviewed the chart and it is accurate to the best of my knowledge. Dentist has been used and  any errors in dictation or transcription are unintentional. [name] M.D. [facility]

## 2017-12-03 DIAGNOSIS — M1712 Unilateral primary osteoarthritis, left knee: Secondary | ICD-10-CM | POA: Diagnosis not present

## 2017-12-04 DIAGNOSIS — G8929 Other chronic pain: Secondary | ICD-10-CM | POA: Diagnosis not present

## 2017-12-04 DIAGNOSIS — M25512 Pain in left shoulder: Secondary | ICD-10-CM | POA: Diagnosis not present

## 2018-02-12 ENCOUNTER — Telehealth: Payer: Self-pay | Admitting: Family Medicine

## 2018-02-12 NOTE — Telephone Encounter (Signed)
Received [employer] form ( Rheumatoid arthritis diagnosis verification from Lupita Leash) for provider to fill out. Form was put in provider's box. Thanks CC

## 2018-02-17 NOTE — Telephone Encounter (Signed)
Dr. Sullivan Lone said he signed these and put them up to be faxed

## 2018-03-10 ENCOUNTER — Ambulatory Visit: Admit: 2018-03-10 | Discharge: 2018-03-10 | Payer: MEDICARE

## 2018-03-10 ENCOUNTER — Ambulatory Visit: Admit: 2018-03-10 | Discharge: 2018-03-10 | Payer: MEDICARE | Attending: Rheumatology | Primary: Rheumatology

## 2018-03-10 DIAGNOSIS — M0579 Rheumatoid arthritis with rheumatoid factor of multiple sites without organ or systems involvement: Principal | ICD-10-CM

## 2018-03-10 DIAGNOSIS — Z1159 Encounter for screening for other viral diseases: Secondary | ICD-10-CM

## 2018-03-10 DIAGNOSIS — Z7952 Long term (current) use of systemic steroids: Secondary | ICD-10-CM | POA: Diagnosis not present

## 2018-03-10 DIAGNOSIS — Z981 Arthrodesis status: Secondary | ICD-10-CM | POA: Diagnosis not present

## 2018-03-10 DIAGNOSIS — Z9889 Other specified postprocedural states: Secondary | ICD-10-CM | POA: Diagnosis not present

## 2018-03-10 DIAGNOSIS — M069 Rheumatoid arthritis, unspecified: Secondary | ICD-10-CM | POA: Diagnosis not present

## 2018-03-10 DIAGNOSIS — Z79899 Other long term (current) drug therapy: Secondary | ICD-10-CM | POA: Diagnosis not present

## 2018-03-10 DIAGNOSIS — M8580 Other specified disorders of bone density and structure, unspecified site: Secondary | ICD-10-CM | POA: Diagnosis not present

## 2018-03-10 LAB — CBC W/ AUTO DIFF
BASOPHILS ABSOLUTE COUNT: 0 10*9/L (ref 0.0–0.1)
BASOPHILS RELATIVE PERCENT: 0.3 %
EOSINOPHILS ABSOLUTE COUNT: 0.1 10*9/L (ref 0.0–0.4)
EOSINOPHILS RELATIVE PERCENT: 0.9 %
HEMATOCRIT: 41.1 % (ref 36.0–46.0)
HEMOGLOBIN: 13.2 g/dL (ref 12.0–16.0)
LARGE UNSTAINED CELLS: 1 % (ref 0–4)
LYMPHOCYTES ABSOLUTE COUNT: 1.1 10*9/L — ABNORMAL LOW (ref 1.5–5.0)
LYMPHOCYTES RELATIVE PERCENT: 11 %
MEAN CORPUSCULAR HEMOGLOBIN CONC: 32.2 g/dL (ref 31.0–37.0)
MEAN CORPUSCULAR HEMOGLOBIN: 26.7 pg (ref 26.0–34.0)
MEAN PLATELET VOLUME: 7 fL (ref 7.0–10.0)
MONOCYTES ABSOLUTE COUNT: 0.6 10*9/L (ref 0.2–0.8)
MONOCYTES RELATIVE PERCENT: 5.5 %
NEUTROPHILS ABSOLUTE COUNT: 8.2 10*9/L — ABNORMAL HIGH (ref 2.0–7.5)
NEUTROPHILS RELATIVE PERCENT: 81.4 %
PLATELET COUNT: 350 10*9/L (ref 150–440)
RED BLOOD CELL COUNT: 4.97 10*12/L (ref 4.00–5.20)
RED CELL DISTRIBUTION WIDTH: 13.7 % (ref 12.0–15.0)
WBC ADJUSTED: 10 10*9/L (ref 4.5–11.0)

## 2018-03-10 LAB — BASIC METABOLIC PANEL
ANION GAP: 9 mmol/L (ref 9–15)
BLOOD UREA NITROGEN: 14 mg/dL (ref 7–21)
BUN / CREAT RATIO: 15
CALCIUM: 9.8 mg/dL (ref 8.5–10.2)
CHLORIDE: 101 mmol/L (ref 98–107)
CO2: 29 mmol/L (ref 22.0–30.0)
CREATININE: 0.91 mg/dL (ref 0.60–1.00)
EGFR MDRD AF AMER: 75 mL/min/{1.73_m2} (ref >=60)
EGFR MDRD NON AF AMER: 62 mL/min/{1.73_m2} (ref >=60)
GLUCOSE RANDOM: 95 mg/dL (ref 65–179)
POTASSIUM: 4.4 mmol/L (ref 3.5–5.0)

## 2018-03-10 LAB — C-REACTIVE PROTEIN: C reactive protein:MCnc:Pt:Ser/Plas:Qn:: 21.1 — ABNORMAL HIGH

## 2018-03-10 LAB — AST (SGOT): Aspartate aminotransferase:CCnc:Pt:Ser/Plas:Qn:: 21

## 2018-03-10 LAB — ALT (SGPT): Alanine aminotransferase:CCnc:Pt:Ser/Plas:Qn:: 30

## 2018-03-10 LAB — NEUTROPHILS RELATIVE PERCENT: Lab: 81.4

## 2018-03-10 LAB — ERYTHROCYTE SEDIMENTATION RATE: Lab: 16

## 2018-03-10 LAB — GLUCOSE RANDOM: Glucose:MCnc:Pt:Ser/Plas:Qn:: 95

## 2018-03-10 MED ORDER — TOFACITINIB 5 MG TABLET: 5 mg | tablet | 1 refills | 0 days

## 2018-03-10 MED ORDER — TOFACITINIB 5 MG TABLET
ORAL_TABLET | Freq: Two times a day (BID) | ORAL | 1 refills | 0.00000 days | Status: CP
Start: 2018-03-10 — End: 2018-03-10

## 2018-03-10 NOTE — Unmapped (Signed)
Per test claim for Pamela Mercer at the [facility], patient needs Medication Assistance Program for Prior Authorization.

## 2018-03-10 NOTE — Unmapped (Signed)
Primary care MD: Dr. [name]    Consulting physician: Dr. Jarold Motto    HPI: [name] is a 67 yo woman with longstanding RF/CCP+ RA. She returns today for routine followup.  She has been off all DMARDs for sometime by her choice.  She remains on low dose prednisone 5 mg daily.      Previous therapies have included:  MTX-- D/C for ? Pulmonary toxicity  Enbrel  D/C in 2011 'making her sick'-- Not sure when it was started.  She came to me in 2008 on this.   Orencia (2011-2014) D/C due to cost    Has also taken oral gold, leflunomide, HCQ.  Humira--> injection site reactions.  Remicade--> peritonitis    She reports that she was doing very well until 2 weeks ago.  She is having a flare of pain and swelling in the hands mostly.  She does not feel well.  Her sleep is poor, but that is not new.   She had a left thumb and index finger MP fusion, 10/02/16  with Dr. Jarold Motto.    Her feet continue to be problematic.  She is not ready to do anything about it. We have discussed forefoot reconstruction in the last     She had a cortisone shot in the L shoulder and L knee prior to Christmas.  Those joints are doing well now.     She remains reluctant to use any medications.  She is concerned about the risk of infection.  She is also very concerned about cost.      She had shingles in the spring.    She was in [place] in July and she developed a rash which resolved on its own.     She had a respiratory infection in the late fall.  She was treated with antibiotics    Her lungs are doing OK. She had the flu this season. She had a terrible headache, no fever.      Immunization History   Administered Date(s) Administered   ??? Influenza Vaccine Quad (IIV4 PF) 44mo+ injectable 12/03/2016, 09/18/2017   ??? Influenza Virus Vaccine, unspecified formulation 10/13/2014, 10/20/2015   ??? PNEUMOCOCCAL POLYSACCHARIDE 23 10/24/2009, 01/10/2015   ??? Pneumococcal Conjugate 13-Valent 07/26/2014   ??? TdaP 11/03/2007, 12/09/2011       Medication Sig   ??? cholecalciferol, vitamin D3, (CHOLECALCIFEROL) 1,000 unit tablet Take 1,000 Units by mouth.   ??? DULoxetine (CYMBALTA) 30 MG capsule TAKE 1 CAPSULE BY MOUTH EVERY DAY   ??? FLUVIRIN influenza vaccine, trivalent (96yr+) 2016-17 ADM 0.5ML IM UTD   ??? levothyroxine (SYNTHROID, LEVOTHROID) 75 MCG tablet TAKE 1 TABLET BY MOUTH EVERY DAY   ??? omeprazole (PRILOSEC) 20 MG capsule TAKE 1 CAPSULE(20 MG) BY MOUTH DAILY   ??? predniSONE (DELTASONE) 5 MG tablet TAKE 1 TABLET EVERY DAY   ??? triamterene-hydrochlorothiazide (MAXZIDE-25) 37.5-25 mg per tablet Take 1 tablet by mouth.     Allergies   Allergen Reactions   ??? Infliximab Other (See Comments) and Anaphylaxis     Cough, rash, kidneys pulsing. vomiting   ??? Shellfish Containing Products Rash and Swelling   ??? Nsaids (Non-Steroidal Anti-Inflammatory Drug)      clinoril   ??? Tolmetin    ??? Ciprofloxacin Rash     Other reaction(s): UNKNOWN   ??? Codeine Nausea And Vomiting     Severe vomiting  Severe vomiting   ??? Meperidine Nausea And Vomiting   ??? Methotrexate Nausea And Vomiting and Rash   ???  Penicillins Rash   ??? Sulindac Rash     ROS; 10 systems are reviewed and are negative except that mentioned in the HPI    EXAMINATION  PE: BP 186/104 (BP Site: L Arm, BP Position: Sitting, BP Cuff Size: Medium)  - Pulse 86  - Temp 35.6 ??C (96.1 ??F) (Oral)  - Ht 167.6 cm (5' 5.98)  - Wt 93 kg (205 lb)  - BMI 33.10 kg/m??   GEN: NAD, A+Ox3   MSK: Diffuse MCP synovitis.  Fusion of the R 2nd MCP. Subluxation of the L 2nd MCP.  Good ROM in shoulders and b/l upper and lower extremities, + b/l knee crepitus, chronic flexion deformities of the IP joints of the toes bilaterally. Tenderness of the forefoot bilaterally with some swelling.    CVS: tachycardic with regular rhythm, no murmurs, rubs or gallops   PULM: clear to auscultation bilateral, symmetric breath sounds. no stridor or wheeze.   EXT: no edema bilaterally, + symmetrical pulses bilateral       LABS  Office Visit on 03/10/2018   Component Date Value   ??? CRP 03/10/2018 21.1*   ??? Sed Rate 03/10/2018 16    ??? Sodium 03/10/2018 139    ??? Potassium 03/10/2018 4.4    ??? Chloride 03/10/2018 101    ??? CO2 03/10/2018 29.0    ??? BUN 03/10/2018 14    ??? Creatinine 03/10/2018 0.91    ??? BUN/Creatinine Ratio 03/10/2018 15    ??? EGFR MDRD Non Af Amer 03/10/2018 62    ??? EGFR MDRD Af Amer 03/10/2018 75    ??? Anion Gap 03/10/2018 9    ??? Glucose 03/10/2018 95    ??? Calcium 03/10/2018 9.8    ??? ALT 03/10/2018 30    ??? AST 03/10/2018 21    ??? Hepatitis C Ab 03/10/2018 Nonreactive    ??? Hep B Core Total Ab 03/10/2018 Nonreactive    ??? Hep B S Ab 03/10/2018 Nonreactive    ??? Hepatitis B Surface Ab Q* 03/10/2018 <8.00    ??? Hepatitis B Surface Ag 03/10/2018 Nonreactive    ??? WBC 03/10/2018 10.0    ??? RBC 03/10/2018 4.97    ??? HGB 03/10/2018 13.2    ??? HCT 03/10/2018 41.1    ??? MCV 03/10/2018 82.8    ??? [facility] 03/10/2018 26.7    ??? MCHC 03/10/2018 32.2    ??? RDW 03/10/2018 13.7    ??? MPV 03/10/2018 7.0    ??? Platelet 03/10/2018 350    ??? Neutrophils % 03/10/2018 81.4    ??? Lymphocytes % 03/10/2018 11.0    ??? Monocytes % 03/10/2018 5.5    ??? Eosinophils % 03/10/2018 0.9    ??? Basophils % 03/10/2018 0.3    ??? Absolute Neutrophils 03/10/2018 8.2*   ??? Absolute Lymphocytes 03/10/2018 1.1*   ??? Absolute Monocytes 03/10/2018 0.6    ??? Absolute Eosinophils 03/10/2018 0.1    ??? Absolute Basophils 03/10/2018 0.0    ??? Large Unstained Cells 03/10/2018 1      IMAGING  DXA March 2018  The bone mineral density in the spine measuring L1 to 4 measures 0.888 gm/cm2. ??The ??Z score is 0.2 and the T score is -1.4. ??This value is above the fracture risk threshold.  The total bone mineral density in the proximal left femur measures 0.777 gm/cm2. ??The Z score is -0.2 and the T score is -1.4. ??This value is above the fracture risk threshold. ??The femoral neck density is 0.700 gm/cm2, and  the T score is -1.3. ??The other T scores range from -2.0 to -0.9.    HANDS today  RIGHT: Patient is status post first and second metacarpophalangeal joint arthrodeses. Hardware appears intact. No perihardware lucency or fracture. There is persistent diffuse osteopenia and no significant change in marginal erosions of the second through fourth metacarpophalangeal joints and proximal interphalangeal joints. Interval osseous remodeling at the distal ulna.  LEFT: Unchanged sequelae of first and second arthrodesis with plate and screws with no perihardware lucency or fracture. No significant change in erosion along the radial aspect of the left third metacarpal head. No significant change in multifocal PIP joint space narrowing with marginal erosions. Ulnar styloid process erosions are also unchanged.    FEET today  LEFT: There are marginal osseous erosions at the MTP joints. Additionally there are osseous erosions at the medial head of the first proximal phalanx and base of the first distal phalanx which are new from prior. Similar appearance of joint space narrowing and lateral angulation/mild subluxation at the second through fourth MTP joints. Joint spaces are otherwise preserved. Multiple, interphalangeal joint flexion deformities. There is a small plantar calcaneal enthesophyte.  RIGHT: Redemonstration of marginal erosions along the MTP joints and distal first phalanx. Unchanged large lucency in the first proximal phalanx, which may represent erosive change. ??Increased lateral angulation deformities at the MTP joints. The joint spaces are otherwise preserved. There is a small plantar calcaneal enthesophyte.    CxR today  Clear lung fields    Assessment and Plan:   67 yo woman with longstanding RF/CCP+ RA who is currently off all disease modifying therapy and only on low dose prednisone.  She is having increased disease activity and is interested in considering further biologic therapy.   Risks and benefits of Tofacitinib are discussed with the patient.  She was also given written information.   ID workup including Hep B and C, Tb screening sent   We will send a script to the shared services center to start the PA process.  She is concerned about out of pocket costs and we will assess that.  There may be assistance available.   I have strongly Recommended the [facility] vaccine since she has had Zoster and TOfacitiib is a high risk medication.   She is up to date on FLU and Pneumonia  My thoughts were discussed in detail with Ms. Parslow.       F/U will be in 3 months.       I personally spent over half of a total 40 minutes face to face with the patient in counseling and discussion and/or coordination of care as described above. Rise Paganini, MD

## 2018-03-10 NOTE — Unmapped (Addendum)
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    Encounter for screening for other viral diseases   -     Hep C Antibody; Future  -     Hep B Core Antibody, total; Future  -     Hep B Surface Antibody; Future  -     Hepatitis B Surface Antigen; Future    SHINGRIX VACCINE IS STRONGLY RECOMMENDED     tofacitinib  Pronunciation:  TOE fa SYE ti nib  Brand:  E[facility] Knuckles XR  What is the most important information I should know about tofacitinib?  You should not use tofacitinib if you have a serious infection. Before you start treatment, your doctor may perform tests to make sure you do not have an infection.  Tofacitinib affects your immune system. You may get infections more easily, even serious or fatal infections. Call your doctor if you have a fever, chills, aches, tiredness, cough, skin sores, diarrhea, weight loss, or burning when you urinate.  What is tofacitinib?  Tofacitinib blocks the activity of certain enzymes in the body that affect immune system function.  Tofacitinib is used to treat moderate to severe rheumatoid arthritis or active psoriatic arthritis in adults who have tried methotrexate other medications without successful treatment of symptoms. Tofacitinib is sometimes given in combination with methotrexate or other arthritis medicines.  Tofacitinib is also used to treat adults with moderate to severe ulcerative colitis.  Tofacitinib may also be used for purposes not listed in this medication guide.  What should I discuss with my healthcare provider before taking tofacitinib?  You should not use tofacitinib if you are allergic to it, or if you have any kind of infection.  Tell your doctor if you have ever had:  ?? liver disease (especially hepatitis B or C);  ?? a chronic infection;  ?? any type of cancer;  ?? a stomach or intestinal problem such as diverticulitis or an ulcer;  ?? kidney disease;  ?? diabetes; or  ?? if you are scheduled to receive any vaccine.  Tell your doctor if you have ever had tuberculosis, if anyone in your household has tuberculosis, or if you have recently traveled to an area where tuberculosis is common [facility] [place], [place], and the [place]).  Using tofacitinib may increase your risk of developing certain cancers, such as lymphoma or skin cancer. Ask  your doctor about this risk.  It is not known whether this medicine will harm an unborn baby. Tell your doctor if you are pregnant or plan to become pregnant. Tofacitinib may affect your ability to have children during treatment and in the future.  If you are pregnant, your name may be listed on a pregnancy registry to track the effects of tofacitinib on the baby.  It is not safe to breast-feed a baby while you are using this medicine.  Also do not breast-feed for at least 18 hours after your last dose (36 hours if you take extended-release tablets). If you use a breast pump during this time, do not feed the milk to your baby.  How should I take tofacitinib?  Before you start treatment with tofacitinib, your doctor may perform tests to make sure you do not have tuberculosis or other infections.  Follow all directions on your prescription label and read all medication guides or instruction sheets. Use the medicine exactly as directed. You may take tofacitinib with or without food.  Do not crush, chew, or break an extended-release tablet. Swallow it whole.  Tofacitinib affects your immune system. You may get infections more easily, even serious or fatal infections.  If you've ever had shingles (herpes zoster), using tofacitinib can cause this virus to become active or get worse.  Your doctor will need to examine you on a regular basis.  Store in the original container at room temperature away from moisture and heat.  Some tablets are made with a shell that is not absorbed or melted in the body. Part of this shell may appear in your stool. This is normal and will not make the medicine less effective.  What happens if I miss a dose?  Take the medicine as soon as you can, but skip the missed dose if it is almost time for your next dose. Do not take two doses at one time.  What happens if I overdose?  Seek emergency medical attention or call the Poison Help line at [phone].  What should I avoid while taking tofacitinib?  Do not receive a live vaccine while using tofacitinib, or you could develop a serious infection. Live vaccines include measles, mumps, rubella (MMR), polio, rotavirus, typhoid, yellow fever, varicella (chickenpox), and zoster (shingles).  What are the possible side effects of tofacitinib?  Get emergency medical help if you have signs of an allergic reaction: hives; difficult breathing; swelling of your face, lips, tongue, or throat.  You may get infections more easily, even serious or fatal infections. Call your doctor right away if you have signs of infection such as:  ?? fever, chills, sweating;  ?? skin sores;  ?? tiredness, muscle pain;  ?? increased urination, pain or burning when you urinate;  ?? stomach pain, diarrhea, weight loss; or  ?? cough, shortness of breath, coughing up pink or red mucus.  Further doses may be delayed until your infection clears up.  Also call your doctor at once if you have:  ?? low red blood cells (anemia) --pale skin, unusual tiredness, feeling light-headed or short of breath, cold hands and feet;  ?? signs of hepatitis --loss of appetite, vomiting, stomach pain (upper right side), fever, tiredness, dark urine, clay-colored stools, jaundice (yellowing of the skin or eyes);  ?? shingles --burning pain, numbness, tingling, itching, skin rash or blisters;  ?? signs of perforation (a hole or tear) in your stomach or intestines --fever, ongoing stomach pain, change in bowel habits; or  ??  signs of tuberculosis: fever, cough, night sweats, loss of appetite, weight loss, and feeling very tired.  Common side effects may include:  ?? skin rash, shingles;  ?? increased blood pressure;  ?? abnormal blood tests;  ?? headache;  ?? diarrhea; or  ?? cold symptoms such as stuffy nose, sneezing, sore throat.  This is not a complete list of side effects and others may occur. Call your doctor for medical advice about side effects. You may report side effects to FDA at 1-800-FDA-1088.  What other drugs will affect tofacitinib?  Sometimes it is not safe to use certain medications at the same time. Some drugs can affect your blood levels of other drugs you take, which may increase side effects or make the medications less effective.  Tell your doctor about all your current medicines. Many drugs can affect tofacitinib, especially:  ?? azathioprine;  ?? cyclosporine;  ?? tuberculosis medicine; or  ?? other drugs to arthritis or ulcerative colitis --abatacept, adalimumab, anakinra, certolizumab, etanercept, golimumab, infliximab, rituximab, secukinumab, tocilizumab, ustekinumab, vedolizumab.  This list is not complete and many other drugs may affect tofacitinib. This includes prescription and over-the-counter medicines, vitamins, and herbal products. Not all possible drug interactions are listed here.  Where can I get more information?  Your pharmacist can provide more information about tofacitinib.    Remember, keep this and all other medicines out of the reach of children, never share your medicines with others, and use this medication only for the indication prescribed.  Every effort has been made to ensure that the information provided by [employer] ('[employer]') is accurate, up-to-date, and complete, but no guarantee is made to that effect. Drug information contained herein may be time sensitive. [employer] information has been compiled for use by healthcare practitioners and consumers in the [place] and therefore [employer] does not warrant that uses outside of the [place] are appropriate, unless specifically indicated otherwise. [employer]'s drug information does not endorse drugs, diagnose patients or recommend therapy. [employer]'s drug information is an [employer] corporate to assist licensed healthcare practitioners in caring for their patients and/or to serve consumers viewing this service as a supplement to, and not a substitute for, the expertise, skill, knowledge and judgment of healthcare practitioners. The absence of a warning for a given drug or drug combination in no way should be construed to indicate that the drug or drug combination is safe, effective or appropriate for any given patient. [employer] does not assume any responsibility for any aspect of healthcare administered with the aid of information [employer] provides. The information contained herein is not intended to cover all possible uses, directions, precautions, warnings, drug interactions, allergic reactions, or adverse effects. If you have questions about the drugs you are taking, check with your doctor, nurse or pharmacist.  Copyright [phone] [employer] Version: 3.01. Revision date: 09/11/2017.  Care instructions adapted under license by [facility] If you have questions about a medical condition or this instruction, always ask your healthcare professional. [employer] disclaims any warranty or liability for your use of this information.

## 2018-03-11 LAB — HEPATITIS B CORE TOTAL ANTIBODY: Hepatitis B virus core Ab:PrThr:Pt:Ser/Plas:Ord:IA: NONREACTIVE

## 2018-03-11 LAB — HEPATITIS B SURFACE ANTIBODY
HEPATITIS B SURFACE ANTIBODY: NONREACTIVE
Hepatitis B virus surface Ab:PrThr:Pt:Ser:Ord:: NONREACTIVE

## 2018-03-11 LAB — HEPATITIS C ANTIBODY: Hepatitis C virus Ab:PrThr:Pt:Ser:Ord:: NONREACTIVE

## 2018-03-11 LAB — HEPATITIS B SURFACE ANTIGEN: Hepatitis B virus surface Ag:PrThr:Pt:Ser:Ord:: NONREACTIVE

## 2018-03-13 LAB — QUANTIFERON TB GOLD PLUS
QUANTIFERON ANTIGEN 2 MINUS NIL: 0 [IU]/mL
QUANTIFERON MITOGEN: 7.8 [IU]/mL

## 2018-03-13 LAB — QUANTIFERON TB NIL VALUE: Lab: 0.12

## 2018-03-24 NOTE — Unmapped (Signed)
Called patient to inform her that [facility] medicare denied PA for [place] and requires a trial of Kevzara first. Patient was amenable to trying Carlis Abbott as long as the co-pay is not too high.  Patient states that she has been paying $1000 every month for her rheumatoid arthritis therapy, and that she will not fill the prescription if it is too expensive.  Ms. Milstein has been on multiple biologic therapy before and declined med counseling on Kevzara for the time being as she is not sure if she will start therapy pending cost.  Injection training and instruction for use provided to patient.  Kevzara script sent to [facility] on behalf of Dr. Lorre Munroe to start the benefit investigation process.     Kathleen Argue, PharmD  PGY-1 Ambulatory Care Resident

## 2018-03-25 MED ORDER — SARILUMAB 200 MG/1.14 ML SUBCUTANEOUS PEN INJECTOR
SUBCUTANEOUS | 2 refills | 0.00000 days | Status: CP
Start: 2018-03-25 — End: 2018-06-30

## 2018-03-25 MED ORDER — SARILUMAB 200 MG/1.14 ML SUBCUTANEOUS PEN INJECTOR: 200 mg | mL | 2 refills | 0 days | Status: AC

## 2018-03-25 NOTE — Unmapped (Signed)
Per test claim for [facility] at the [facility], patient needs Medication Assistance Program for Prior Authorization.

## 2018-04-02 ENCOUNTER — Ambulatory Visit: Payer: Medicare PPO | Admitting: Family Medicine

## 2018-04-02 ENCOUNTER — Other Ambulatory Visit: Payer: Self-pay

## 2018-04-02 VITALS — BP 100/70 | HR 112 | Temp 98.0°F | Resp 14 | Wt 200.0 lb

## 2018-04-02 DIAGNOSIS — I10 Essential (primary) hypertension: Secondary | ICD-10-CM

## 2018-04-02 DIAGNOSIS — E039 Hypothyroidism, unspecified: Secondary | ICD-10-CM

## 2018-04-02 DIAGNOSIS — M069 Rheumatoid arthritis, unspecified: Secondary | ICD-10-CM | POA: Diagnosis not present

## 2018-04-02 NOTE — Progress Notes (Signed)
Angela Sherman  MRN: [id number] DOB: 1951/05/13  Subjective:  HPI   The patient is a 67 year old female who presents for follow up of chronic health.  She was last seen in the office on 12/01/17 for an asthmatic bronchitis.  She was seen prior to that for an episode of shingles with post herpatic neuralgia.  It has been almost a year since her labs were done by [name].  However, she did just have several tests done by her rheumatologist. She was given cologuard in September but has not completed the testing yet. She has been reminded and states she will try to get it done.  She is having a flare with her rheumatoid arthritis today.  Patient Active Problem List   Diagnosis Date Noted  . Cholecystitis with cholelithiasis 06/04/2016  . Gallstones without obstruction of gallbladder 05/21/2016  . Rheumatoid arthritis of wrist (HCC) 05/21/2016  . Hypercholesteremia 04/09/2016  . Avitaminosis D 04/09/2016  . 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 Vitamin B deficiency 07/12/2010  . Anxiety, generalized 06/29/2010  . Essential (primary) hypertension 05/02/2008  . Adult hypothyroidism 02/10/2008  . Clinical depression 03/17/2007  . Allergic rhinitis 11/04/2005  . Arthritis or polyarthritis, rheumatoid (HCC) 11/04/2005  . Herpes 11/04/2005    Past Medical History:  Diagnosis Date  . Arthritis   . Depression   . GERD (gastroesophageal reflux disease)   . History of deep vein thrombosis    Right calf  . History of peritonitis 2002?   Right calf.  . Hypertension   . PONV (postoperative nausea and vomiting)   . Thyroid disease     Social History   Socioeconomic History  . Marital status: Single    Spouse name: Not on file  . Number of children: Not on file  . Years of education: Not on file  . Highest education level: Not on file  Occupational History  . Not on file  Social Needs  . Financial resource strain: Not on file  . Food insecurity:    Worry: Not on file    Inability: Not on  file  . Transportation needs:    Medical: Not on file    Non-medical: Not on file  Tobacco Use  . Smoking status: Former Smoker    Types: Cigarettes  . Smokeless tobacco: Never Used  Substance and Sexual Activity  . Alcohol use: Yes    Comment: Occasional, maybe 1-2 a month  . Drug use: No  . Sexual activity: Never  Lifestyle  . Physical activity:    Days per week: Not on file    Minutes per session: Not on file  . Stress: Not on file  Relationships  . Social connections:    Talks on phone: Not on file    Gets together: Not on file    Attends religious service: Not on file    Active member of club or organization: Not on file    Attends meetings of clubs or organizations: Not on file    Relationship status: Not on file  . Intimate partner violence:    Fear of current or ex partner: Not on file    Emotionally abused: Not on file    Physically abused: Not on file    Forced sexual activity: Not on file  Other Topics Concern  . Not on file  Social History Narrative  . Not on file    Outpatient Encounter Medications as of 04/02/2018  Medication Sig  . cholecalciferol (VITAMIN D)  1000 units tablet Take 1,000 Units by mouth daily.   . DULoxetine (CYMBALTA) 30 MG capsule TAKE 1 CAPSULE BY MOUTH EVERY DAY  . levothyroxine (SYNTHROID, LEVOTHROID) 75 MCG tablet TAKE 1 TABLET (75 MCG TOTAL)  DAILY.  [name] omeprazole (PRILOSEC) 20 MG capsule TAKE 1 CAPSULE(20 MG) BY MOUTH DAILY  . predniSONE (DELTASONE) 5 MG tablet Take 5 mg by mouth daily with breakfast.  . triamterene-hydrochlorothiazide (MAXZIDE-25) 37.5-25 MG tablet Take 1 tablet by mouth daily.    No facility-administered encounter medications on file as of 04/02/2018.     Allergies  Allergen Reactions  . Remicade [Infliximab] Anaphylaxis  . Shellfish-Derived Products Rash and Swelling  . Ciprofloxacin   . Demerol [Meperidine] Nausea And Vomiting  . Nsaids   . Tolmetin   . Aspirin Rash  . Clinoril [Sulindac] Rash  . Codeine  Nausea And Vomiting    Severe vomiting  . Methotrexate Nausea And Vomiting and Rash  . Methotrexate Derivatives Rash and Cough  . Penicillins Rash  . Shellfish Allergy Swelling and Rash    Review of Systems  Constitutional: Positive for malaise/fatigue. Negative for fever.  Respiratory: Positive for cough (chronic and unchanged). Negative for shortness of breath and wheezing.   Cardiovascular: Negative for chest pain, palpitations, orthopnea, claudication and leg swelling.  Musculoskeletal: Positive for back pain, falls, joint pain, myalgias and neck pain.    Objective:  BP 100/70 (BP Location: Right Arm, Patient Position: Sitting, Cuff Size: Normal)   Pulse (!) 112   Temp 98 F (36.7 C) (Oral)   Resp 14   Wt 200 lb (90.7 kg)   BMI 32.28 kg/m   Physical Exam  Constitutional: She is oriented to person, place, and time and well-developed, well-nourished, and in no distress.  HENT:  Head: Normocephalic and atraumatic.  Eyes: Conjunctivae are normal.  Neck: No thyromegaly present.  Cardiovascular: Normal rate, regular rhythm and normal heart sounds.  Pulmonary/Chest: Effort normal.  Abdominal: Soft.  Musculoskeletal:  RA changes of hands.  Neurological: She is alert and oriented to person, place, and time. Gait normal. GCS score is 15.  Skin: Skin is warm and dry.  Psychiatric: Mood, memory, affect and judgment normal.    Assessment and Plan :  HTN HLD GERD RA CPE later 2019.  I have done the exam and reviewed the chart and it is accurate to the best of my knowledge. Dentist has been used and  any errors in dictation or transcription are unintentional. [name] M.D. [facility]

## 2018-04-21 DIAGNOSIS — M9901 Segmental and somatic dysfunction of cervical region: Secondary | ICD-10-CM | POA: Diagnosis not present

## 2018-04-21 DIAGNOSIS — M47892 Other spondylosis, cervical region: Secondary | ICD-10-CM | POA: Diagnosis not present

## 2018-04-21 DIAGNOSIS — M9902 Segmental and somatic dysfunction of thoracic region: Secondary | ICD-10-CM | POA: Diagnosis not present

## 2018-04-21 DIAGNOSIS — M4604 Spinal enthesopathy, thoracic region: Secondary | ICD-10-CM | POA: Diagnosis not present

## 2018-04-28 DIAGNOSIS — M9902 Segmental and somatic dysfunction of thoracic region: Secondary | ICD-10-CM | POA: Diagnosis not present

## 2018-04-28 DIAGNOSIS — M4604 Spinal enthesopathy, thoracic region: Secondary | ICD-10-CM | POA: Diagnosis not present

## 2018-04-28 DIAGNOSIS — M47892 Other spondylosis, cervical region: Secondary | ICD-10-CM | POA: Diagnosis not present

## 2018-04-28 DIAGNOSIS — M9901 Segmental and somatic dysfunction of cervical region: Secondary | ICD-10-CM | POA: Diagnosis not present

## 2018-05-05 DIAGNOSIS — M9902 Segmental and somatic dysfunction of thoracic region: Secondary | ICD-10-CM | POA: Diagnosis not present

## 2018-05-05 DIAGNOSIS — M4604 Spinal enthesopathy, thoracic region: Secondary | ICD-10-CM | POA: Diagnosis not present

## 2018-05-05 DIAGNOSIS — M47892 Other spondylosis, cervical region: Secondary | ICD-10-CM | POA: Diagnosis not present

## 2018-05-05 DIAGNOSIS — M9901 Segmental and somatic dysfunction of cervical region: Secondary | ICD-10-CM | POA: Diagnosis not present

## 2018-05-12 DIAGNOSIS — M4604 Spinal enthesopathy, thoracic region: Secondary | ICD-10-CM | POA: Diagnosis not present

## 2018-05-12 DIAGNOSIS — M9901 Segmental and somatic dysfunction of cervical region: Secondary | ICD-10-CM | POA: Diagnosis not present

## 2018-05-12 DIAGNOSIS — M47892 Other spondylosis, cervical region: Secondary | ICD-10-CM | POA: Diagnosis not present

## 2018-05-12 DIAGNOSIS — M9902 Segmental and somatic dysfunction of thoracic region: Secondary | ICD-10-CM | POA: Diagnosis not present

## 2018-05-19 DIAGNOSIS — M9901 Segmental and somatic dysfunction of cervical region: Secondary | ICD-10-CM | POA: Diagnosis not present

## 2018-05-19 DIAGNOSIS — M9902 Segmental and somatic dysfunction of thoracic region: Secondary | ICD-10-CM | POA: Diagnosis not present

## 2018-05-19 DIAGNOSIS — M4604 Spinal enthesopathy, thoracic region: Secondary | ICD-10-CM | POA: Diagnosis not present

## 2018-05-19 DIAGNOSIS — M47892 Other spondylosis, cervical region: Secondary | ICD-10-CM | POA: Diagnosis not present

## 2018-06-02 DIAGNOSIS — M9901 Segmental and somatic dysfunction of cervical region: Secondary | ICD-10-CM | POA: Diagnosis not present

## 2018-06-02 DIAGNOSIS — M9902 Segmental and somatic dysfunction of thoracic region: Secondary | ICD-10-CM | POA: Diagnosis not present

## 2018-06-02 DIAGNOSIS — M4604 Spinal enthesopathy, thoracic region: Secondary | ICD-10-CM | POA: Diagnosis not present

## 2018-06-02 DIAGNOSIS — M47892 Other spondylosis, cervical region: Secondary | ICD-10-CM | POA: Diagnosis not present

## 2018-06-20 ENCOUNTER — Other Ambulatory Visit: Payer: Self-pay | Admitting: Family Medicine

## 2018-06-30 ENCOUNTER — Ambulatory Visit: Admit: 2018-06-30 | Discharge: 2018-07-01 | Payer: MEDICARE | Attending: Rheumatology | Primary: Rheumatology

## 2018-06-30 DIAGNOSIS — M0579 Rheumatoid arthritis with rheumatoid factor of multiple sites without organ or systems involvement: Principal | ICD-10-CM

## 2018-06-30 MED ORDER — ABATACEPT 125 MG/ML SUBCUTANEOUS SYRINGE
INJECTION | SUBCUTANEOUS | 2 refills | 0.00000 days | Status: CP
Start: 2018-06-30 — End: 2018-06-30

## 2018-06-30 MED ORDER — ABATACEPT 125 MG/ML SUBCUTANEOUS SYRINGE: mL | 2 refills | 0 days

## 2018-06-30 NOTE — Unmapped (Signed)
Per test claim for [place] at the [facility], patient needs Medication Assistance Program for Prior Authorization.

## 2018-06-30 NOTE — Unmapped (Signed)
Primary care MD: Dr. [name]    Consulting physician: Dr. Jarold Motto    HPI: Pamela Mercer is a 67 yo woman with longstanding RF/CCP+ RA. She returns today for routine followup.  She has been off all DMARDs for sometime by her choice.  She remains on low dose prednisone 5 mg daily.      Previous therapies have included:  MTX-- D/C for ? Pulmonary toxicity  Enbrel  D/C in 2011 'making her sick'-- Not sure when it was started.  She came to me in 2008 on this.   Orencia (2011-2014) D/C due to cost    Has also taken oral gold, leflunomide, HCQ.  Humira--> injection site reactions.  Remicade--> peritonitis    At her last visit she was having a flare and we decided to try Tofacitinib.  This was not a preferred drug on her plan and they recommended treatment with sarilumab.  Pamela Mercer was not interested in trying this (did not want to inject) and it was cost prohibitive.     She remains on monotherapy with prednisone 5 mg daily.  She is reluctant to use biologics due to the risk of shingles, which she has had in the past.  She has not yet gotten the shingrix vaccine.     She developed the acute onset of loss of ROM of the L elbow.  She cannot say whether it was painful or swollen.  She says that 'she does not pay much attention to her joints'.  She has some pain and loss of ROM of the L shoulder and attributes this to some heavy lifting. Of note, the L shoulder has been a chronic problem and she has had steroid injections with some benefit.      Otherwise, she reports that she is doing well.  She continues with a chronic cough.      Immunization History   Administered Date(s) Administered   ??? Influenza Vaccine Quad (IIV4 PF) 64mo+ injectable 12/03/2016, 09/18/2017   ??? Influenza Virus Vaccine, unspecified formulation 10/13/2014, 10/20/2015   ??? PNEUMOCOCCAL POLYSACCHARIDE 23 10/24/2009, 01/10/2015   ??? Pneumococcal Conjugate 13-Valent 07/26/2014   ??? TdaP 11/03/2007, 12/09/2011       Medication Sig   ??? cholecalciferol, vitamin D3, (CHOLECALCIFEROL) 1,000 unit tablet Take 1,000 Units by mouth.   ??? DULoxetine (CYMBALTA) 30 MG capsule TAKE 1 CAPSULE BY MOUTH EVERY DAY   ??? FLUVIRIN influenza vaccine, trivalent (49yr+) 2016-17 ADM 0.5ML IM UTD   ??? levothyroxine (SYNTHROID, LEVOTHROID) 75 MCG tablet TAKE 1 TABLET BY MOUTH EVERY DAY   ??? omeprazole (PRILOSEC) 20 MG capsule TAKE 1 CAPSULE(20 MG) BY MOUTH DAILY   ??? predniSONE (DELTASONE) 5 MG tablet TAKE 1 TABLET EVERY DAY   ??? triamterene-hydrochlorothiazide (MAXZIDE-25) 37.5-25 mg per tablet Take 1 tablet by mouth.   ??? turmeric 400 mg cap Take by mouth daily.       Allergies   Allergen Reactions   ??? Infliximab Other (See Comments) and Anaphylaxis     Cough, rash, kidneys pulsing. vomiting   ??? Shellfish Containing Products Rash and Swelling   ??? Nsaids (Non-Steroidal Anti-Inflammatory Drug)      clinoril   ??? Tolmetin    ??? Ciprofloxacin Rash     Other reaction(s): UNKNOWN   ??? Codeine Nausea And Vomiting     Severe vomiting  Severe vomiting   ??? Meperidine Nausea And Vomiting   ??? Methotrexate Nausea And Vomiting and Rash   ??? Penicillins Rash   ??? Sulindac  Rash     ROS; 10 systems are reviewed and are negative except that mentioned in the HPI    EXAMINATION  PE: BP 136/99  - Pulse 109  - Temp 36.3 ??C (97.4 ??F)  - Ht 167.6 cm (5' 5.98)  - Wt 89.8 kg (198 lb)  - BMI 31.97 kg/m??   GEN: NAD, A+Ox3   MSK: Good ROM of the neck.  Decreased abduction of the L shoulder.  No glenohumeral effusions.  15 degrees of flexion of the L elbow.  R elbow is fine. Diffuse MCP synovitis.  Fusion of the R 2nd MCP. Subluxation of the L 2nd MCP.  Good ROM of the hips.  Crepitus of bothe knees.  Diffuse forefoot deformities without synovitis.    CVS: Tachycardic with regular rhythm, no murmurs, rubs or gallops   PULM: clear to auscultation bilateral, symmetric breath sounds. no stridor or wheeze.   EXT: no edema bilaterally, + symmetrical pulses bilateral     LABS  Office Visit on 03/10/2018   Component Date Value   ??? CRP 03/10/2018 21.1*   ??? Sed Rate 03/10/2018 16    ??? Sodium 03/10/2018 139    ??? Potassium 03/10/2018 4.4    ??? Chloride 03/10/2018 101    ??? CO2 03/10/2018 29.0    ??? BUN 03/10/2018 14    ??? Creatinine 03/10/2018 0.91    ??? BUN/Creatinine Ratio 03/10/2018 15    ??? EGFR MDRD Non Af Amer 03/10/2018 62    ??? EGFR MDRD Af Amer 03/10/2018 75    ??? Anion Gap 03/10/2018 9    ??? Glucose 03/10/2018 95    ??? Calcium 03/10/2018 9.8    ??? ALT 03/10/2018 30    ??? AST 03/10/2018 21    ??? Hepatitis C Ab 03/10/2018 Nonreactive    ??? Hep B Core Total Ab 03/10/2018 Nonreactive    ??? Hep B S Ab 03/10/2018 Nonreactive    ??? Hepatitis B Surface Ab Q* 03/10/2018 <8.00    ??? Hepatitis B Surface Ag 03/10/2018 Nonreactive    ??? Quantiferon TB Gold 03/10/2018 Negative    ??? Quantiferon NIL Value 03/10/2018 0.12    ??? Quantiferon Mitogen Minu* 03/10/2018 7.80    ??? Quantiferon Antigen Minu* 03/10/2018 -0.02    ??? Quantiferon Antigen 2 mi* 03/10/2018 0.00    ??? WBC 03/10/2018 10.0    ??? RBC 03/10/2018 4.97    ??? HGB 03/10/2018 13.2    ??? HCT 03/10/2018 41.1    ??? MCV 03/10/2018 82.8    ??? [facility] 03/10/2018 26.7    ??? MCHC 03/10/2018 32.2    ??? RDW 03/10/2018 13.7    ??? MPV 03/10/2018 7.0    ??? Platelet 03/10/2018 350    ??? Neutrophils % 03/10/2018 81.4    ??? Lymphocytes % 03/10/2018 11.0    ??? Monocytes % 03/10/2018 5.5    ??? Eosinophils % 03/10/2018 0.9    ??? Basophils % 03/10/2018 0.3    ??? Absolute Neutrophils 03/10/2018 8.2*   ??? Absolute Lymphocytes 03/10/2018 1.1*   ??? Absolute Monocytes 03/10/2018 0.6    ??? Absolute Eosinophils 03/10/2018 0.1    ??? Absolute Basophils 03/10/2018 0.0    ??? Large Unstained Cells 03/10/2018 1    ]        IMAGING--personally reviewed  DXA March 2018  The bone mineral density in the spine measuring L1 to 4 measures 0.888 gm/cm2. ??The ??Z score is 0.2 and the T score is -1.4. ??This value is  above the fracture risk threshold.  The total bone mineral density in the proximal left femur measures 0.777 gm/cm2. ??The Z score is -0.2 and the T score is -1.4. ??This value is above the fracture risk threshold. ??The femoral neck density is 0.700 gm/cm2, and the T score is -1.3. ??The other T scores range from -2.0 to -0.9.    HANDS March 2019  RIGHT: Patient is status post first and second metacarpophalangeal joint arthrodeses. Hardware appears intact. No perihardware lucency or fracture. There is persistent diffuse osteopenia and no significant change in marginal erosions of the second through fourth metacarpophalangeal joints and proximal interphalangeal joints. Interval osseous remodeling at the distal ulna.  LEFT: Unchanged sequelae of first and second arthrodesis with plate and screws with no perihardware lucency or fracture. No significant change in erosion along the radial aspect of the left third metacarpal head. No significant change in multifocal PIP joint space narrowing with marginal erosions. Ulnar styloid process erosions are also unchanged.    FEET March 2019  LEFT: There are marginal osseous erosions at the MTP joints. Additionally there are osseous erosions at the medial head of the first proximal phalanx and base of the first distal phalanx which are new from prior. Similar appearance of joint space narrowing and lateral angulation/mild subluxation at the second through fourth MTP joints. Joint spaces are otherwise preserved. Multiple, interphalangeal joint flexion deformities. There is a small plantar calcaneal enthesophyte.  RIGHT: Redemonstration of marginal erosions along the MTP joints and distal first phalanx. Unchanged large lucency in the first proximal phalanx, which may represent erosive change. ??Increased lateral angulation deformities at the MTP joints. The joint spaces are otherwise preserved. There is a small plantar calcaneal enthesophyte.    CxR March 2019  Clear lung fields    Assessment and Plan:     Pamela Mercer is a 67 year old woman with longstanding seropositive erosive rheumatoid arthritis. She has active synovitis of the small joints of the hands and has lost some ROm of the L elbow since her last visit with me just a few months ago.  She has been rather reluctant to pursue DMARD therapy for multiple reasons-- infection risk and cost being the most prominent.      She has had longstanding disease and has tolerated that quite well, however, I am concerned that she is going to continue to have progressive involvement of other joints just as the L elbow now seems to be involved.  We discussed this in detail today.      She has new Medicare insurance and her coverage may be different.  She has previously tolerated Orencia and did well and is willing to go back to that if the cost is manageable.  Risks and benefits are reviewed.      We will continue prednisone 5 mg daily.  I have sent a script for Orencia 125 mg SQ weekly to [facility] to begin the prior authorization process.      I have again recommended the [facility] vaccine and have advised that she had that at a local pharmacy.    F/U will be in 3 months or sooner as needed.

## 2018-07-22 DIAGNOSIS — H2513 Age-related nuclear cataract, bilateral: Secondary | ICD-10-CM | POA: Diagnosis not present

## 2018-07-23 NOTE — Unmapped (Signed)
Patient is experiencing a rash on both legs. She is wanting to be seen before it gets as bad as last itme. Please advise.

## 2018-07-24 DIAGNOSIS — M9901 Segmental and somatic dysfunction of cervical region: Secondary | ICD-10-CM | POA: Diagnosis not present

## 2018-07-24 DIAGNOSIS — M9902 Segmental and somatic dysfunction of thoracic region: Secondary | ICD-10-CM | POA: Diagnosis not present

## 2018-07-24 DIAGNOSIS — M47892 Other spondylosis, cervical region: Secondary | ICD-10-CM | POA: Diagnosis not present

## 2018-07-24 DIAGNOSIS — M4604 Spinal enthesopathy, thoracic region: Secondary | ICD-10-CM | POA: Diagnosis not present

## 2018-07-27 IMAGING — CT CT RENAL STONE PROTOCOL
2 of 4 series · 16 of 46 positions shown, 18 images · non-contrast
Comparison: None.

CLINICAL DATA: Left flank pain.

EXAM:
CT ABDOMEN AND PELVIS WITHOUT CONTRAST
TECHNIQUE: Multidetector CT imaging of the abdomen and pelvis was performed
following the standard protocol without IV contrast.

[Series 2: stone full standard · axial · 0.75mm/px · z∈[-466,-61]mm · 13 of 89 slices shown, 15 images]
[im 4/89  soft-tissue]
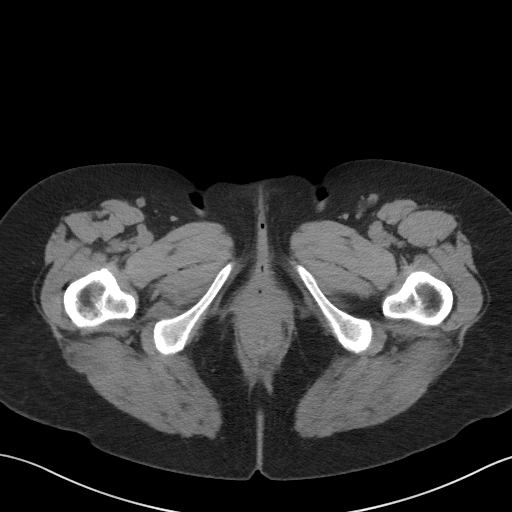
[im 4/89  bone]
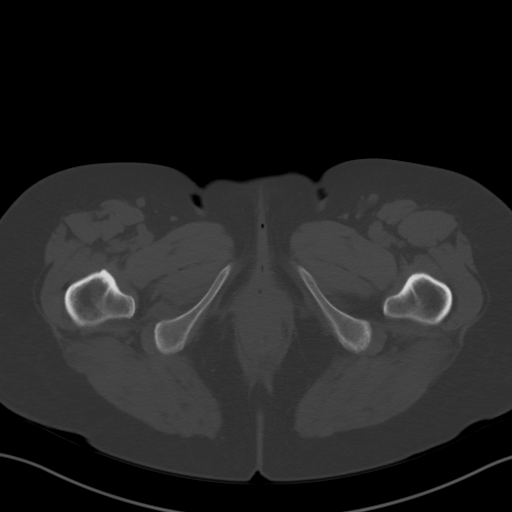
[im 12/89  soft-tissue]
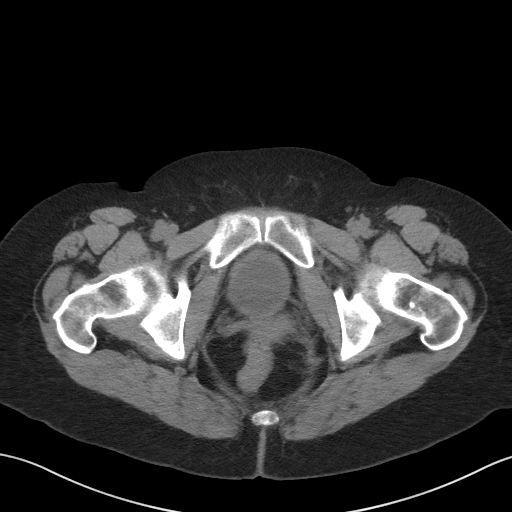
[im 19/89  soft-tissue]
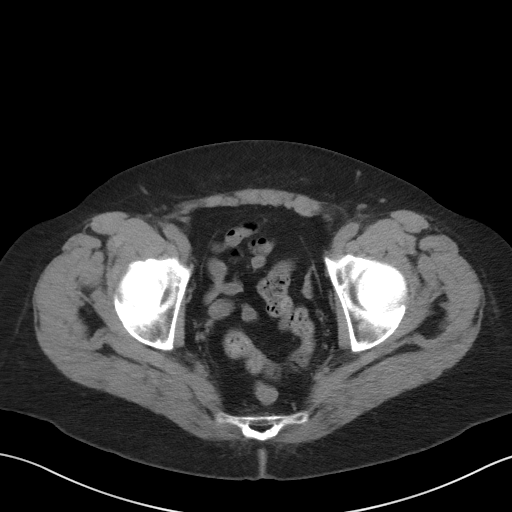
[im 26/89  soft-tissue]
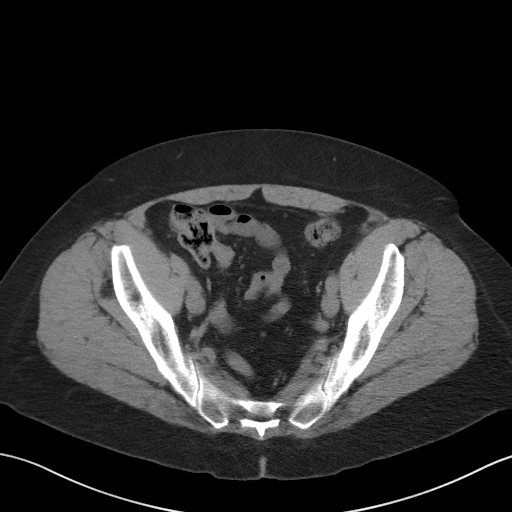
[im 30/89  soft-tissue]
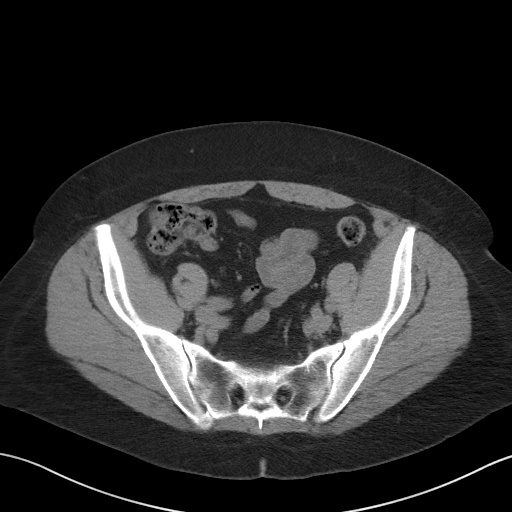
[im 37/89  soft-tissue]
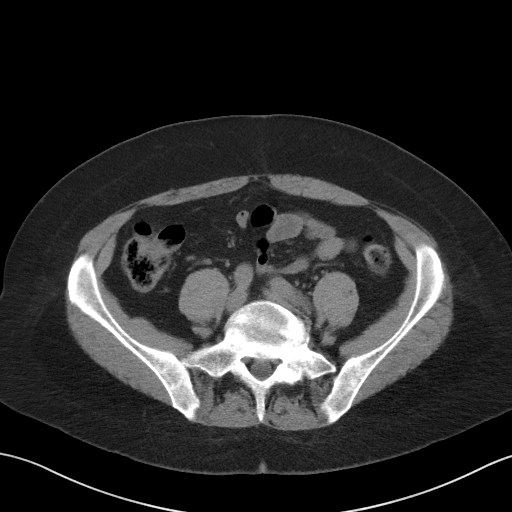
[im 45/89  soft-tissue]
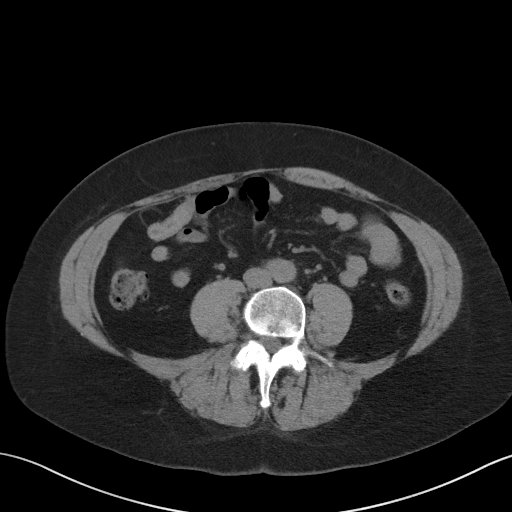
[im 52/89  soft-tissue]
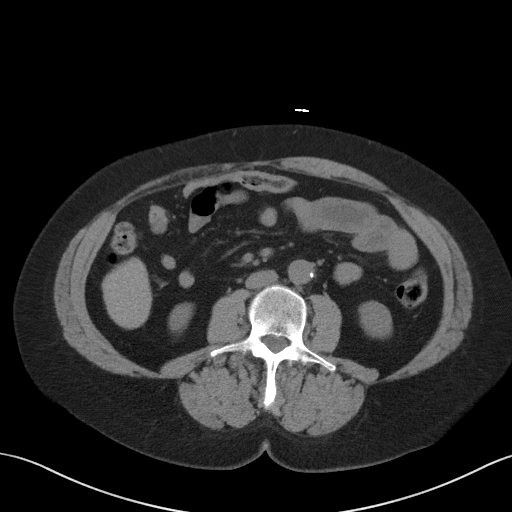
[im 59/89  soft-tissue]
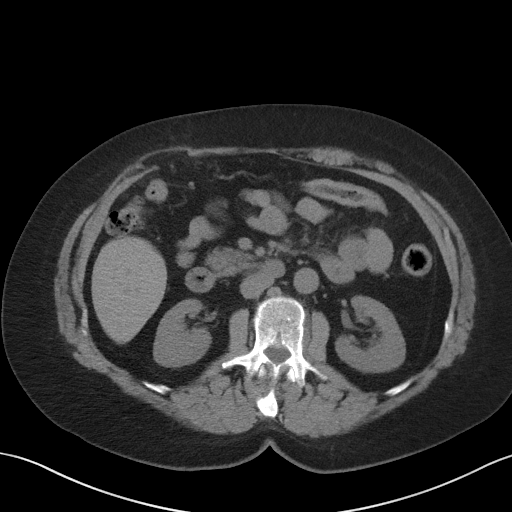
[im 59/89  bone]
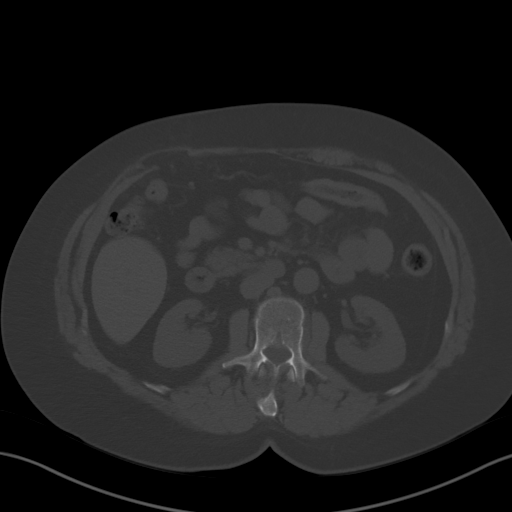
[im 63/89  soft-tissue]
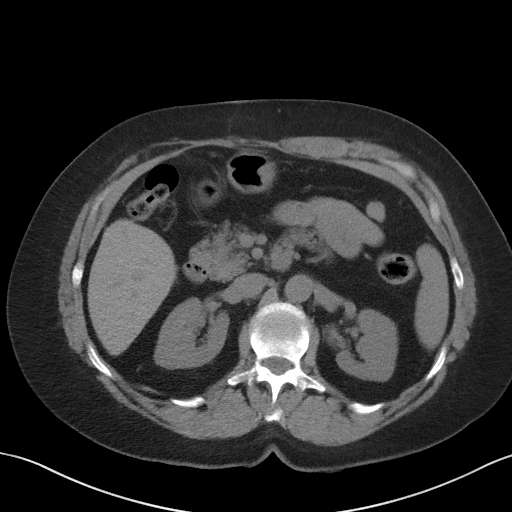
[im 70/89  soft-tissue]
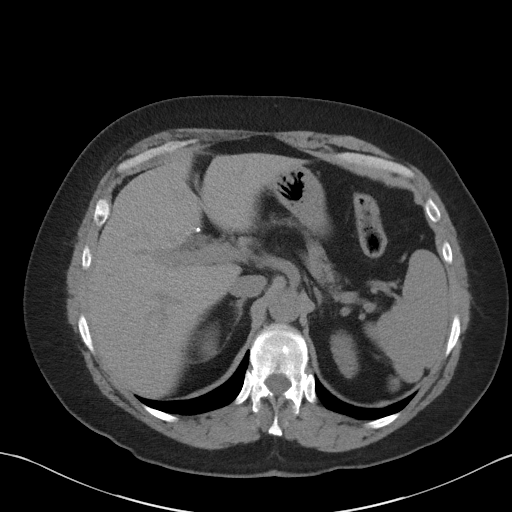
[im 78/89  soft-tissue]
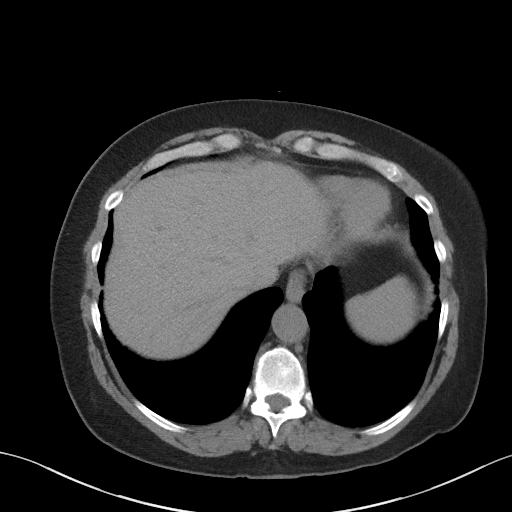
[im 85/89  soft-tissue]
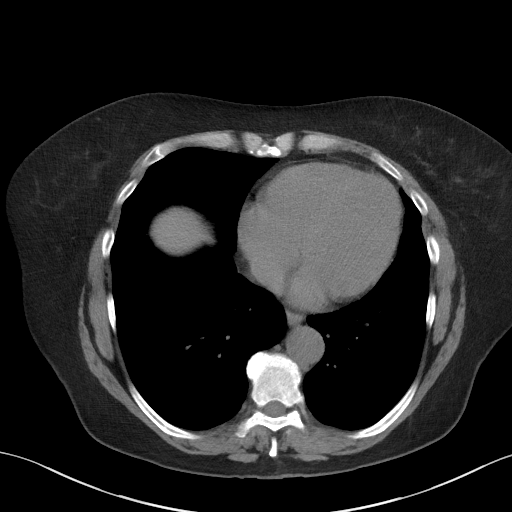

[Series 5: coronal · coronal · 0.74mm/px · 3 of 131 slices shown]
[im 44/131  soft-tissue]
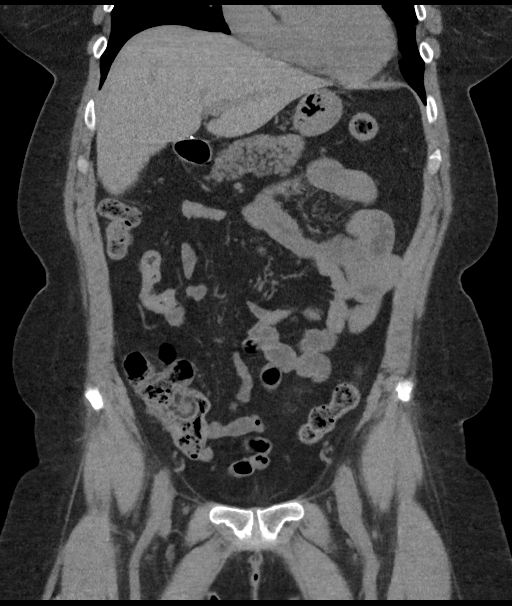
[im 58/131  soft-tissue]
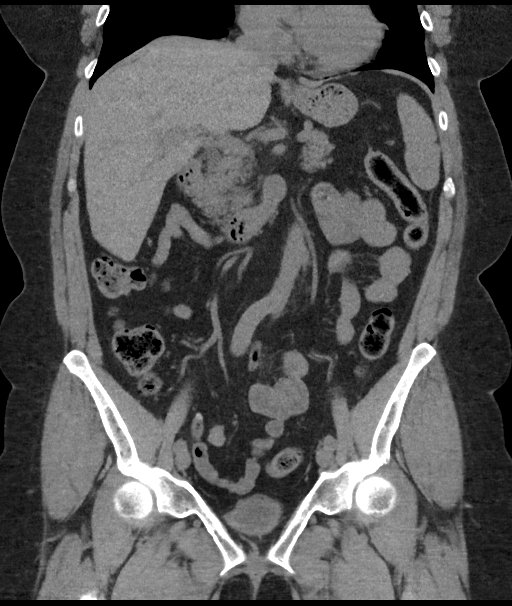
[im 73/131  soft-tissue]
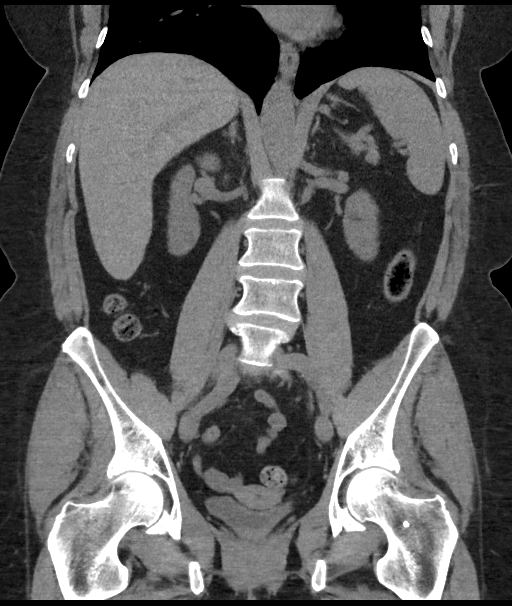

[16 of 46 positions shown; findings below may reference images not displayed]

FINDINGS: Lower chest: Both lung bases demonstrate mild scarring.

Hepatobiliary: No focal liver abnormality is seen. Status post
cholecystectomy. No biliary dilatation.

Pancreas: Unremarkable. No pancreatic ductal dilatation or
surrounding inflammatory changes.

Spleen: Normal in size without focal abnormality.

Adrenals/Urinary Tract: Adrenal glands are unremarkable. Both
kidneys show no evidence of calculi or hydronephrosis. Ureters and
bladder are unremarkable.

Stomach/Bowel: Bowel shows no evidence of obstruction or
inflammation. Scattered diverticulosis throughout the colon without
evidence of diverticulitis. No free air or abnormal focal fluid
collections.

Vascular/Lymphatic: No enlarged lymph nodes. The abdominal aorta is
normal in caliber.

Reproductive: Uterus and bilateral adnexa are unremarkable.

Other: No free fluid identified. Small umbilical hernia contains
fat. There is a partial defect in atrophic right lateral abdominal
wall rectus musculature containing fat without complete herniation
through the fascia.

Musculoskeletal: Degenerative disc disease at L5-S1 with vacuum
disc.
IMPRESSION: 1. No significant acute findings in the abdomen or pelvis.
2. Colonic diverticulosis without evidence of diverticulitis.
3. Small umbilical hernia containing fat. Partial defect of atrophic
right abdominal wall rectus musculature without full herniation.

## 2018-07-28 DIAGNOSIS — M9902 Segmental and somatic dysfunction of thoracic region: Secondary | ICD-10-CM | POA: Diagnosis not present

## 2018-07-28 DIAGNOSIS — M47892 Other spondylosis, cervical region: Secondary | ICD-10-CM | POA: Diagnosis not present

## 2018-07-28 DIAGNOSIS — M4604 Spinal enthesopathy, thoracic region: Secondary | ICD-10-CM | POA: Diagnosis not present

## 2018-07-28 DIAGNOSIS — M9901 Segmental and somatic dysfunction of cervical region: Secondary | ICD-10-CM | POA: Diagnosis not present

## 2018-07-31 NOTE — Unmapped (Signed)
Called patient and she stated it has been over one week and it does not matter what was going on at this point. Did not want to give me any details. She stated thanks for calling and hung up the phone    [facility]

## 2018-08-04 ENCOUNTER — Ambulatory Visit: Payer: Self-pay | Admitting: Family Medicine

## 2018-08-04 DIAGNOSIS — M9901 Segmental and somatic dysfunction of cervical region: Secondary | ICD-10-CM | POA: Diagnosis not present

## 2018-08-04 DIAGNOSIS — M47892 Other spondylosis, cervical region: Secondary | ICD-10-CM | POA: Diagnosis not present

## 2018-08-04 DIAGNOSIS — M4604 Spinal enthesopathy, thoracic region: Secondary | ICD-10-CM | POA: Diagnosis not present

## 2018-08-04 DIAGNOSIS — M9902 Segmental and somatic dysfunction of thoracic region: Secondary | ICD-10-CM | POA: Diagnosis not present

## 2018-08-06 DIAGNOSIS — H2511 Age-related nuclear cataract, right eye: Secondary | ICD-10-CM | POA: Diagnosis not present

## 2018-08-10 ENCOUNTER — Encounter: Payer: Self-pay | Admitting: *Deleted

## 2018-08-11 DIAGNOSIS — M4604 Spinal enthesopathy, thoracic region: Secondary | ICD-10-CM | POA: Diagnosis not present

## 2018-08-11 DIAGNOSIS — M9902 Segmental and somatic dysfunction of thoracic region: Secondary | ICD-10-CM | POA: Diagnosis not present

## 2018-08-11 DIAGNOSIS — M9901 Segmental and somatic dysfunction of cervical region: Secondary | ICD-10-CM | POA: Diagnosis not present

## 2018-08-11 DIAGNOSIS — M47892 Other spondylosis, cervical region: Secondary | ICD-10-CM | POA: Diagnosis not present

## 2018-08-12 ENCOUNTER — Telehealth: Payer: Self-pay

## 2018-08-12 NOTE — Telephone Encounter (Signed)
LM requesting a CB. Pt needs to schedule her AWV after 09/18/18. -MM

## 2018-08-18 ENCOUNTER — Encounter: Payer: Self-pay | Admitting: Family Medicine

## 2018-08-18 ENCOUNTER — Ambulatory Visit (INDEPENDENT_AMBULATORY_CARE_PROVIDER_SITE_OTHER): Payer: Medicare HMO | Admitting: Family Medicine

## 2018-08-18 VITALS — BP 122/78 | HR 100 | Temp 98.5°F | Resp 16 | Ht 66.0 in | Wt 198.0 lb

## 2018-08-18 DIAGNOSIS — L730 Acne keloid: Secondary | ICD-10-CM | POA: Diagnosis not present

## 2018-08-18 DIAGNOSIS — I10 Essential (primary) hypertension: Secondary | ICD-10-CM

## 2018-08-18 DIAGNOSIS — M069 Rheumatoid arthritis, unspecified: Secondary | ICD-10-CM | POA: Diagnosis not present

## 2018-08-18 DIAGNOSIS — E785 Hyperlipidemia, unspecified: Secondary | ICD-10-CM

## 2018-08-18 DIAGNOSIS — E039 Hypothyroidism, unspecified: Secondary | ICD-10-CM | POA: Diagnosis not present

## 2018-08-18 NOTE — Progress Notes (Signed)
Patient: Angela Sherman Female    DOB: 1951/09/12   67 y.o.   MRN: [id number] Visit Date: 08/18/2018  Today's Provider: Megan Mans, MD   Chief Complaint  Patient presents with  . Hypertension  . Hypothyroidism  . Mouth Lesions   Subjective:    HPI Patient comes in today for a follow up. She was last seen in the office 4 months ago. No changes were made in her medications.   She checks her BP occasionally, but reports that she has no issues. She is also going to have cataract surgery next month by Dr. Druscilla Brownie.   She also mentions that she has a blister on the corner of her mouth that has been there for over 1 month. She does not feel this is a fever blister. She has not used anything OTC for her symptoms.   RA is stable.    Allergies  Allergen Reactions  . Remicade [Infliximab] Anaphylaxis  . Shellfish-Derived Products Rash and Swelling  . Ciprofloxacin   . Demerol [Meperidine] Nausea And Vomiting  . Nsaids   . Tolmetin   . Aspirin Rash  . Clinoril [Sulindac] Rash  . Codeine Nausea And Vomiting    Severe vomiting  . Methotrexate Nausea And Vomiting and Rash  . Methotrexate Derivatives Rash and Cough  . Penicillins Rash  . Shellfish Allergy Swelling and Rash     Current Outpatient Medications:  .  cholecalciferol (VITAMIN D) 1000 units tablet, Take 1,000 Units by mouth daily. , Disp: , Rfl:  .  DULoxetine (CYMBALTA) 30 MG capsule, TAKE 1 CAPSULE BY MOUTH EVERY DAY, Disp: 30 capsule, Rfl: 11 .  levothyroxine (SYNTHROID, LEVOTHROID) 75 MCG tablet, TAKE 1 TABLET (75 MCG TOTAL)  DAILY., Disp: 90 tablet, Rfl: 3 .  omeprazole (PRILOSEC) 20 MG capsule, TAKE 1 CAPSULE(20 MG) BY MOUTH DAILY, Disp: 30 capsule, Rfl: 12 .  predniSONE (DELTASONE) 5 MG tablet, Take 5 mg by mouth daily with breakfast., Disp: , Rfl:  .  triamterene-hydrochlorothiazide (MAXZIDE-25) 37.5-25 MG tablet, Take 1 tablet by mouth daily. , Disp: , Rfl:   Review of Systems  Constitutional:  Negative for activity change, appetite change, chills, diaphoresis, fatigue, fever and unexpected weight change.  Respiratory: Negative for cough and shortness of breath.   Cardiovascular: Negative for chest pain, palpitations and leg swelling.  Endocrine: Negative for cold intolerance, heat intolerance, polydipsia and polyuria.  Musculoskeletal: Positive for arthralgias and myalgias.  Skin: Negative for color change, pallor, rash and wound.  Neurological: Negative for dizziness and headaches.    Social History   Tobacco Use  . Smoking status: Former Smoker    Types: Cigarettes  . Smokeless tobacco: Never Used  Substance Use Topics  . Alcohol use: Yes    Comment: Occasional, maybe 1-2 a month   Objective:   BP 122/78 (BP Location: Right Arm, Patient Position: Sitting, Cuff Size: Normal)   Pulse 100   Temp 98.5 F (36.9 C)   Resp 16   Ht 5\' 6"  (1.676 m)   Wt 198 lb (89.8 kg)   BMI 31.96 kg/m  Vitals:   08/18/18 1015  BP: 122/78  Pulse: 100  Resp: 16  Temp: 98.5 F (36.9 C)  Weight: 198 lb (89.8 kg)  Height: 5\' 6"  (1.676 m)     Physical Exam  Constitutional: She is oriented to person, place, and time. She appears well-developed and well-nourished.  HENT:  Head: Normocephalic and atraumatic.  Right Ear: External  ear normal.  Left Ear: External ear normal.  Nose: Nose normal.  Mouth/Throat: Oropharynx is clear and moist.  Mild angular cheilitis  Eyes: Conjunctivae are normal. No scleral icterus.  Neck: No thyromegaly present.  Cardiovascular: Normal rate, regular rhythm and normal heart sounds.  Pulmonary/Chest: Effort normal and breath sounds normal.  Abdominal: Soft.  Musculoskeletal: She exhibits no edema.  Neurological: She is alert and oriented to person, place, and time.  Skin: Skin is warm and dry.  Psychiatric: She has a normal mood and affect. Her behavior is normal. Judgment and thought content normal.        Assessment & Plan:     1. Essential  (primary) hypertension  - Comprehensive metabolic panel  2. Adult hypothyroidism  - TSH  3. Rheumatoid arthritis involving both wrists, unspecified rheumatoid factor presence (HCC)   I have done the exam and reviewed the chart and it is accurate to the best of my knowledge. Dentist has been used and  any errors in dictation or transcription are unintentional. [name] M.D. [facility]  - CBC with Differential/Platelet  4. Hyperlipidemia, unspecified hyperlipidemia type  - Lipid panel  5. Folliculitis cheloidalis  I have done the exam and reviewed the chart and it is accurate to the best of my knowledge. Dentist has been used and  any errors in dictation or transcription are unintentional. [name] M.D. [facility]         Megan Mans, MD  [facility]

## 2018-08-25 DIAGNOSIS — M9901 Segmental and somatic dysfunction of cervical region: Secondary | ICD-10-CM | POA: Diagnosis not present

## 2018-08-25 DIAGNOSIS — M9902 Segmental and somatic dysfunction of thoracic region: Secondary | ICD-10-CM | POA: Diagnosis not present

## 2018-08-25 DIAGNOSIS — M4604 Spinal enthesopathy, thoracic region: Secondary | ICD-10-CM | POA: Diagnosis not present

## 2018-08-25 DIAGNOSIS — M47892 Other spondylosis, cervical region: Secondary | ICD-10-CM | POA: Diagnosis not present

## 2018-08-26 NOTE — Telephone Encounter (Signed)
Pt declined the AWV at this time and states she just saw Dr Sullivan Lone. FYI! -MM

## 2018-08-27 DIAGNOSIS — E039 Hypothyroidism, unspecified: Secondary | ICD-10-CM | POA: Diagnosis not present

## 2018-08-27 DIAGNOSIS — E785 Hyperlipidemia, unspecified: Secondary | ICD-10-CM | POA: Diagnosis not present

## 2018-08-27 DIAGNOSIS — M069 Rheumatoid arthritis, unspecified: Secondary | ICD-10-CM | POA: Diagnosis not present

## 2018-08-27 DIAGNOSIS — I10 Essential (primary) hypertension: Secondary | ICD-10-CM | POA: Diagnosis not present

## 2018-08-28 LAB — CBC WITH DIFFERENTIAL/PLATELET
BASOS ABS: 0.1 10*3/uL (ref 0.0–0.2)
Basos: 1 %
EOS (ABSOLUTE): 0.1 10*3/uL (ref 0.0–0.4)
Eos: 2 %
HEMOGLOBIN: 12.5 g/dL (ref 11.1–15.9)
Hematocrit: 38 % (ref 34.0–46.6)
IMMATURE GRANULOCYTES: 0 %
Immature Grans (Abs): 0 10*3/uL (ref 0.0–0.1)
LYMPHS: 31 %
Lymphocytes Absolute: 2.1 10*3/uL (ref 0.7–3.1)
MCH: 27 pg (ref 26.6–33.0)
MCHC: 32.9 g/dL (ref 31.5–35.7)
MCV: 82 fL (ref 79–97)
Monocytes Absolute: 0.5 10*3/uL (ref 0.1–0.9)
Monocytes: 7 %
NEUTROS PCT: 59 %
Neutrophils Absolute: 4.1 10*3/uL (ref 1.4–7.0)
Platelets: 389 10*3/uL (ref 150–450)
RBC: 4.63 x10E6/uL (ref 3.77–5.28)
RDW: 13.1 % (ref 12.3–15.4)
WBC: 6.9 10*3/uL (ref 3.4–10.8)

## 2018-08-28 LAB — COMPREHENSIVE METABOLIC PANEL
ALBUMIN: 4 g/dL (ref 3.6–4.8)
ALK PHOS: 110 IU/L (ref 39–117)
ALT: 15 IU/L (ref 0–32)
AST: 19 IU/L (ref 0–40)
Albumin/Globulin Ratio: 1.7 (ref 1.2–2.2)
BUN/Creatinine Ratio: 13 (ref 12–28)
BUN: 13 mg/dL (ref 8–27)
Bilirubin Total: 0.4 mg/dL (ref 0.0–1.2)
CALCIUM: 9.8 mg/dL (ref 8.7–10.3)
CO2: 21 mmol/L (ref 20–29)
Chloride: 101 mmol/L (ref 96–106)
Creatinine, Ser: 0.99 mg/dL (ref 0.57–1.00)
GFR calc Af Amer: 69 mL/min/{1.73_m2} (ref >59)
GFR calc non Af Amer: 60 mL/min/{1.73_m2} (ref >59)
GLUCOSE: 88 mg/dL (ref 65–99)
Globulin, Total: 2.4 g/dL (ref 1.5–4.5)
POTASSIUM: 3.9 mmol/L (ref 3.5–5.2)
SODIUM: 138 mmol/L (ref 134–144)
Total Protein: 6.4 g/dL (ref 6.0–8.5)

## 2018-08-28 LAB — LIPID PANEL
CHOL/HDL RATIO: 3.6 ratio (ref 0.0–4.4)
CHOLESTEROL TOTAL: 171 mg/dL (ref 100–199)
HDL: 48 mg/dL (ref >39)
LDL Calculated: 101 mg/dL — ABNORMAL HIGH (ref 0–99)
TRIGLYCERIDES: 110 mg/dL (ref 0–149)
VLDL Cholesterol Cal: 22 mg/dL (ref 5–40)

## 2018-08-28 LAB — TSH: TSH: 3.06 u[IU]/mL (ref 0.450–4.500)

## 2018-09-01 ENCOUNTER — Telehealth: Payer: Self-pay

## 2018-09-01 NOTE — Telephone Encounter (Signed)
-----   Message from Maple Hudson., MD sent at 09/01/2018 10:17 AM EDT ----- Stable.

## 2018-09-01 NOTE — Telephone Encounter (Signed)
Pt advised.   Thanks,   -Laura  

## 2018-09-03 ENCOUNTER — Ambulatory Visit: Payer: Medicare HMO | Admitting: Anesthesiology

## 2018-09-03 ENCOUNTER — Ambulatory Visit
Admission: RE | Admit: 2018-09-03 | Discharge: 2018-09-03 | Disposition: A | Discharge status: Home / Self Care | Payer: Medicare HMO | Source: Ambulatory Visit | Attending: Ophthalmology | Admitting: Ophthalmology

## 2018-09-03 ENCOUNTER — Encounter
Admission: RE | Disposition: A | Discharge status: Home / Self Care | Payer: Self-pay | Source: Ambulatory Visit | Attending: Ophthalmology

## 2018-09-03 ENCOUNTER — Other Ambulatory Visit: Payer: Self-pay

## 2018-09-03 ENCOUNTER — Encounter: Payer: Self-pay | Admitting: *Deleted

## 2018-09-03 DIAGNOSIS — H2511 Age-related nuclear cataract, right eye: Secondary | ICD-10-CM | POA: Insufficient documentation

## 2018-09-03 DIAGNOSIS — Z87891 Personal history of nicotine dependence: Secondary | ICD-10-CM | POA: Diagnosis not present

## 2018-09-03 DIAGNOSIS — M069 Rheumatoid arthritis, unspecified: Secondary | ICD-10-CM | POA: Diagnosis not present

## 2018-09-03 DIAGNOSIS — I10 Essential (primary) hypertension: Secondary | ICD-10-CM | POA: Diagnosis not present

## 2018-09-03 DIAGNOSIS — Z79899 Other long term (current) drug therapy: Secondary | ICD-10-CM | POA: Diagnosis not present

## 2018-09-03 DIAGNOSIS — E039 Hypothyroidism, unspecified: Secondary | ICD-10-CM | POA: Insufficient documentation

## 2018-09-03 DIAGNOSIS — F329 Major depressive disorder, single episode, unspecified: Secondary | ICD-10-CM | POA: Insufficient documentation

## 2018-09-03 DIAGNOSIS — Z86718 Personal history of other venous thrombosis and embolism: Secondary | ICD-10-CM | POA: Diagnosis not present

## 2018-09-03 HISTORY — DX: Hypothyroidism, unspecified: E03.9

## 2018-09-03 HISTORY — DX: Headache, unspecified: R51.9

## 2018-09-03 HISTORY — DX: Headache: R51

## 2018-09-03 HISTORY — PX: CATARACT EXTRACTION W/PHACO: SHX586

## 2018-09-03 SURGERY — PHACOEMULSIFICATION, CATARACT, WITH IOL INSERTION
Anesthesia: Monitor Anesthesia Care | Site: Eye | Laterality: Right | Wound class: "Clean "

## 2018-09-03 MED ORDER — BSS IO SOLN
INTRAOCULAR | Status: DC | PRN
  Administered 2018-09-03: 08:00:00 via OPHTHALMIC

## 2018-09-03 MED ORDER — TETRACAINE HCL 0.5 % OP SOLN
OPHTHALMIC | Status: AC
  Administered 2018-09-03: 1 [drp] via OPHTHALMIC
  Filled 2018-09-03: qty 4

## 2018-09-03 MED ORDER — ONDANSETRON HCL 4 MG/2ML IJ SOLN: 4.0000 mg | Freq: Once | INTRAMUSCULAR | Status: DC | PRN

## 2018-09-03 MED ORDER — POVIDONE-IODINE 5 % OP SOLN
OPHTHALMIC | Status: AC
  Filled 2018-09-03: qty 30

## 2018-09-03 MED ORDER — CYCLOPENTOLATE HCL 2 % OP SOLN
OPHTHALMIC | Status: AC
  Filled 2018-09-03: qty 2

## 2018-09-03 MED ORDER — POLYMYXIN B-TRIMETHOPRIM 10000-0.1 UNIT/ML-% OP SOLN
OPHTHALMIC | Status: DC | PRN
  Administered 2018-09-03: 1 [drp]

## 2018-09-03 MED ORDER — PHENYLEPHRINE HCL 10 % OP SOLN
1.0000 [drp] | OPHTHALMIC | Status: AC
  Administered 2018-09-03 (×4): 1 [drp] via OPHTHALMIC

## 2018-09-03 MED ORDER — ARMC OPHTHALMIC DILATING DROPS
OPHTHALMIC | Status: AC
  Filled 2018-09-03: qty 0.5

## 2018-09-03 MED ORDER — POLYMYXIN B-TRIMETHOPRIM 10000-0.1 UNIT/ML-% OP SOLN
OPHTHALMIC | Status: AC
  Filled 2018-09-03: qty 10

## 2018-09-03 MED ORDER — MIDAZOLAM HCL 2 MG/2ML IJ SOLN
INTRAMUSCULAR | Status: DC | PRN
  Administered 2018-09-03: 1 mg via INTRAVENOUS

## 2018-09-03 MED ORDER — LIDOCAINE HCL (PF) 4 % IJ SOLN
INTRAMUSCULAR | Status: AC
  Filled 2018-09-03: qty 5

## 2018-09-03 MED ORDER — FENTANYL CITRATE (PF) 100 MCG/2ML IJ SOLN
INTRAMUSCULAR | Status: DC | PRN
  Administered 2018-09-03: 50 ug via INTRAVENOUS

## 2018-09-03 MED ORDER — FENTANYL CITRATE (PF) 100 MCG/2ML IJ SOLN: 25.0000 ug | INTRAMUSCULAR | Status: DC | PRN

## 2018-09-03 MED ORDER — CYCLOPENTOLATE HCL 2 % OP SOLN
1.0000 [drp] | OPHTHALMIC | Status: AC
  Administered 2018-09-03 (×4): 1 [drp] via OPHTHALMIC

## 2018-09-03 MED ORDER — LIDOCAINE HCL (PF) 4 % IJ SOLN
INTRAOCULAR | Status: DC | PRN
  Administered 2018-09-03: 4 mL via OPHTHALMIC

## 2018-09-03 MED ORDER — POVIDONE-IODINE 5 % OP SOLN
OPHTHALMIC | Status: DC | PRN
  Administered 2018-09-03: 1 via OPHTHALMIC

## 2018-09-03 MED ORDER — EPINEPHRINE PF 1 MG/ML IJ SOLN
INTRAMUSCULAR | Status: AC
  Filled 2018-09-03: qty 2

## 2018-09-03 MED ORDER — TETRACAINE HCL 0.5 % OP SOLN
1.0000 [drp] | Freq: Once | OPHTHALMIC | Status: AC
  Administered 2018-09-03: 1 [drp] via OPHTHALMIC

## 2018-09-03 MED ORDER — PHENYLEPHRINE HCL 10 % OP SOLN
OPHTHALMIC | Status: AC
  Administered 2018-09-03: 1 [drp] via OPHTHALMIC
  Filled 2018-09-03: qty 5

## 2018-09-03 MED ORDER — NA CHONDROIT SULF-NA HYALURON 40-17 MG/ML IO SOLN
INTRAOCULAR | Status: DC | PRN
  Administered 2018-09-03: 1 mL via INTRAOCULAR

## 2018-09-03 MED ORDER — SODIUM CHLORIDE 0.9 % IV SOLN
INTRAVENOUS | Status: DC
  Administered 2018-09-03: 07:00:00 via INTRAVENOUS

## 2018-09-03 MED ORDER — NA CHONDROIT SULF-NA HYALURON 40-17 MG/ML IO SOLN
INTRAOCULAR | Status: AC
  Filled 2018-09-03: qty 1

## 2018-09-03 MED ORDER — POLYMYXIN B-TRIMETHOPRIM 10000-0.1 UNIT/ML-% OP SOLN: 1.0000 [drp] | OPHTHALMIC | Status: DC | PRN

## 2018-09-03 MED ORDER — CARBACHOL 0.01 % IO SOLN
INTRAOCULAR | Status: DC | PRN
  Administered 2018-09-03: 0.5 mL via INTRAOCULAR

## 2018-09-03 MED ORDER — MOXIFLOXACIN HCL 0.5 % OP SOLN
OPHTHALMIC | Status: AC
  Filled 2018-09-03: qty 3

## 2018-09-03 SURGICAL SUPPLY — 18 items
GLOVE BIO SURGEON STRL SZ8 (GLOVE) ×2 IMPLANT
GLOVE BIOGEL M 6.5 STRL (GLOVE) ×2 IMPLANT
GLOVE SURG LX 8.0 MICRO (GLOVE) ×1
GLOVE SURG LX STRL 8.0 MICRO (GLOVE) ×1 IMPLANT
GOWN STRL REUS W/ TWL LRG LVL3 (GOWN DISPOSABLE) ×2 IMPLANT
GOWN STRL REUS W/TWL LRG LVL3 (GOWN DISPOSABLE) ×2
LABEL CATARACT MEDS ST (LABEL) ×2 IMPLANT
LENS IOL ACRSF IQ TRC 4 26.5 IMPLANT
LENS IOL ACRYSOF IQ TORIC 26.5 ×1 IMPLANT
LENS IOL IQ TORIC 4 26.5 ×1 IMPLANT
PACK CATARACT (MISCELLANEOUS) ×2 IMPLANT
PACK CATARACT BRASINGTON LX (MISCELLANEOUS) ×2 IMPLANT
PACK EYE AFTER SURG (MISCELLANEOUS) ×2 IMPLANT
SOL BSS BAG (MISCELLANEOUS) ×2
SOLUTION BSS BAG (MISCELLANEOUS) ×1 IMPLANT
SYR 5ML LL (SYRINGE) ×2 IMPLANT
WATER STERILE IRR 250ML POUR (IV SOLUTION) ×2 IMPLANT
WIPE NON LINTING 3.25X3.25 (MISCELLANEOUS) ×2 IMPLANT

## 2018-09-03 NOTE — Discharge Instructions (Addendum)
Eye Surgery Discharge Instructions  Expect mild scratchy sensation or mild soreness. DO NOT RUB YOUR EYE!  The day of surgery:  Minimal physical activity, but bed rest is not required  No reading, computer work, or close hand work  No bending, lifting, or straining.  May watch TV  For 24 hours:  No driving, legal decisions, or alcoholic beverages  Safety precautions  Eat anything you prefer: It is better to start with liquids, then soup then solid foods.  Solar shield eyeglasses should be worn for comfort in the sunlight/patch while sleeping  Resume all regular medications including aspirin or Coumadin if these were discontinued prior to surgery. You may shower, bathe, shave, or wash your hair. Tylenol may be taken for mild discomfort. FOLLOW DR. PORFILIO'S EYE DROP INSTRUCTION SHEET AS REVIEWED.  Call your doctor if you experience significant pain, nausea, or vomiting, fever > 101 or other signs of infection. [phone] or [phone] Specific instructions:  Follow-up Information    Galen Manila, MD Follow up.   Specialty:  Ophthalmology Why:  09/04/18 @ 3:40 PM Contact information: [address] [place] [address] [phone]

## 2018-09-03 NOTE — H&P (Signed)
All labs reviewed. Abnormal studies sent to patients PCP when indicated.  Previous H&P reviewed, patient examined, there are NO CHANGES.  Angela Darin Porfilio9/5/20197:44 AM

## 2018-09-03 NOTE — Op Note (Signed)
PREOPERATIVE DIAGNOSIS:  Nuclear sclerotic cataract of the right eye.   POSTOPERATIVE DIAGNOSIS:  Nuclear sclerotic cataract of the right eye.   OPERATIVE PROCEDURE: Procedure(s): CATARACT EXTRACTION PHACO AND INTRAOCULAR LENS PLACEMENT (IOC)   SURGEON:  Galen Manila, MD.   ANESTHESIA: 1.      Managed anesthesia care. 2.     0.38ml of Shugarcaine was instilled following the paracentesis  Anesthesiologist: Yevette Edwards, MD CRNA: Darrol Jump, CRNA  COMPLICATIONS:  None.   TECHNIQUE:   Stop and chop    DESCRIPTION OF PROCEDURE:  The patient was examined and consented in the preoperative holding area where the aforementioned topical anesthesia was applied to the right eye.  The patient was brought back to the Operating Room where he was sat upright on the gurney and given a target to fixate upon while the eye was marked at the 3:00 and 9:00 position.  The patient was then reclined on the operating table.  The eye was prepped and draped in the usual sterile ophthalmic fashion and a lid speculum was placed. A paracentesis was created with the side port blade and the anterior chamber was filled with viscoelastic. A near clear corneal incision was performed with the steel keratome. A continuous curvilinear capsulorrhexis was performed with a cystotome followed by the capsulorrhexis forceps. Hydrodissection and hydrodelineation were carried out with BSS on a blunt cannula. The lens was removed in a stop and chop technique and the remaining cortical material was removed with the irrigation-aspiration handpiece. The eye was inflated with viscoelastic and the ZCT  lens  was placed in the eye and rotated to within a few degrees of the predetermined orientation.  The remaining viscoelastic was removed from the eye.  The Sinskey hook was used to rotate the toric lens into its final resting place at 091 degrees.  0. The eye was inflated to a physiologic pressure and found to be watertight..  The eye was  dressed with Polytrim. The patient was given protective glasses to wear throughout the day and a shield with which to sleep tonight. The patient was also given drops with which to begin a drop regimen today and will follow-up with me in one day. Implant Name Type Inv. Item Serial No. Manufacturer Lot No. LRB No. Used  LENS IOL TORIC 26.5 - [id number]  LENS IOL TORIC 26.5 [id number] ALCON  Right 1   Procedure(s) with comments: CATARACT EXTRACTION PHACO AND INTRAOCULAR LENS PLACEMENT (IOC) (Right) - [name] 00:31.5  AP% 14.9 CDE 4.70 Fluid pack Lot # [id number]  Electronically signed: Galen Manila 09/03/2018 8:07 AM

## 2018-09-03 NOTE — Anesthesia Postprocedure Evaluation (Signed)
Anesthesia Post Note  Patient: Angela Sherman  Procedure(s) Performed: CATARACT EXTRACTION PHACO AND INTRAOCULAR LENS PLACEMENT (IOC) (Right Eye)  Patient location during evaluation: PACU Anesthesia Type: MAC Level of consciousness: awake and alert Vital Signs Assessment: post-procedure vital signs reviewed and stable Anesthetic complications: no     Last Vitals:  Vitals:   09/03/18 0638  BP: (!) 151/94  Pulse: 87  Resp: 16  Temp: 36.5 C  SpO2: 95%    Last Pain:  Vitals:   09/03/18 0638  TempSrc: Temporal  PainSc: 6                  Bernardo Heater

## 2018-09-03 NOTE — Anesthesia Preprocedure Evaluation (Signed)
Anesthesia Evaluation  Patient identified by MRN, date of birth, ID band Patient awake    Reviewed: Allergy & Precautions, H&P , NPO status , Patient's Chart, lab work & pertinent test results, reviewed documented beta blocker date and time   History of Anesthesia Complications (+) PONV and history of anesthetic complications  Airway Mallampati: III  TM Distance: >3 FB Neck ROM: full    Dental no notable dental hx. (+) Teeth Intact   Pulmonary neg pulmonary ROS, former smoker,    Pulmonary exam normal breath sounds clear to auscultation       Cardiovascular Exercise Tolerance: Good hypertension, On Medications negative cardio ROS   Rhythm:regular Rate:Normal     Neuro/Psych  Headaches, PSYCHIATRIC DISORDERS Anxiety Depression negative neurological ROS  negative psych ROS   GI/Hepatic negative GI ROS, Neg liver ROS, GERD  Medicated,  Endo/Other  negative endocrine ROSdiabetesHypothyroidism   Renal/GU      Musculoskeletal   Abdominal   Peds  Hematology negative hematology ROS (+)   Anesthesia Other Findings   Reproductive/Obstetrics negative OB ROS                             Anesthesia Physical Anesthesia Plan  ASA: II  Anesthesia Plan: MAC   Post-op Pain Management:    Induction:   PONV Risk Score and Plan:   Airway Management Planned:   Additional Equipment:   Intra-op Plan:   Post-operative Plan:   Informed Consent: I have reviewed the patients History and Physical, chart, labs and discussed the procedure including the risks, benefits and alternatives for the proposed anesthesia with the patient or authorized representative who has indicated his/her understanding and acceptance.     Plan Discussed with: CRNA  Anesthesia Plan Comments:         Anesthesia Quick Evaluation

## 2018-09-03 NOTE — Anesthesia Post-op Follow-up Note (Signed)
Anesthesia QCDR form completed.        

## 2018-09-03 NOTE — Transfer of Care (Signed)
Immediate Anesthesia Transfer of Care Note  Patient: Angela Sherman  Procedure(s) Performed: CATARACT EXTRACTION PHACO AND INTRAOCULAR LENS PLACEMENT (IOC) (Right Eye)  Patient Location: PACU  Anesthesia Type:MAC  Level of Consciousness: awake, alert , oriented and patient cooperative  Airway & Oxygen Therapy: Patient Spontanous Breathing  Post-op Assessment: Report given to RN and Post -op Vital signs reviewed and stable  Post vital signs: Reviewed and stable  Last Vitals:  Vitals Value Taken Time  BP    Temp    Pulse    Resp    SpO2      Last Pain:  Vitals:   09/03/18 0638  TempSrc: Temporal  PainSc: 6          Complications: No apparent anesthesia complications

## 2018-09-25 DIAGNOSIS — H2512 Age-related nuclear cataract, left eye: Secondary | ICD-10-CM | POA: Diagnosis not present

## 2018-10-01 ENCOUNTER — Encounter: Payer: Self-pay | Admitting: *Deleted

## 2018-10-05 ENCOUNTER — Encounter: Payer: Self-pay | Admitting: Family Medicine

## 2018-10-05 ENCOUNTER — Ambulatory Visit (INDEPENDENT_AMBULATORY_CARE_PROVIDER_SITE_OTHER): Payer: Medicare HMO | Admitting: Family Medicine

## 2018-10-05 VITALS — BP 130/90 | HR 100 | Temp 98.2°F | Resp 20 | Wt 196.2 lb

## 2018-10-05 DIAGNOSIS — J069 Acute upper respiratory infection, unspecified: Secondary | ICD-10-CM

## 2018-10-05 MED ORDER — DOXYCYCLINE HYCLATE 100 MG PO TABS: 100.0000 mg | ORAL_TABLET | Freq: Two times a day (BID) | ORAL | 0 refills | Status: DC

## 2018-10-05 NOTE — Patient Instructions (Signed)
Discussed use of Delsym for cough and Mucinex D for congestion. If sinuses not improving in the next 24-48 hours start the antibiotic.

## 2018-10-05 NOTE — Progress Notes (Signed)
  Subjective:     Patient ID: LATESSA TILLIS, female   DOB: 1951/11/28, 67 y.o.   MRN: [id number] Chief Complaint  Patient presents with  . URI    cough, runny nose, teeth pain, post nasal drip, coughing up mucus clear/green x's 3 days. Patient has taken Tylenol over the night.    HPI Remains on prednisone 5mg  for R.A.  Review of Systems     Objective:   Physical Exam  Constitutional: She appears well-developed and well-nourished. No distress.  Ears: T.M's intact without inflammation Sinuses: mild right maxillary sinus tenderness Throat: no tonsillar enlargement or exudate Neck: no cervical adenopathy Lungs: clear     Assessment:    1. Upper respiratory tract infection, unspecified type - doxycycline (VIBRA-TABS) 100 MG tablet; Take 1 tablet (100 mg total) by mouth 2 (two) times daily.  Dispense: 20 tablet; Refill: 0    Plan:    Discussed use of Mucinex D and Delsym. If not improving in the next 24-48 hours to start the abx.

## 2018-10-20 ENCOUNTER — Ambulatory Visit: Payer: Medicare HMO | Admitting: Anesthesiology

## 2018-10-20 ENCOUNTER — Ambulatory Visit
Admission: RE | Admit: 2018-10-20 | Discharge: 2018-10-20 | Disposition: A | Discharge status: Home / Self Care | Payer: Medicare HMO | Source: Ambulatory Visit | Attending: Ophthalmology | Admitting: Ophthalmology

## 2018-10-20 ENCOUNTER — Other Ambulatory Visit: Payer: Self-pay

## 2018-10-20 ENCOUNTER — Encounter
Admission: RE | Disposition: A | Discharge status: Home / Self Care | Payer: Self-pay | Source: Ambulatory Visit | Attending: Ophthalmology

## 2018-10-20 DIAGNOSIS — E039 Hypothyroidism, unspecified: Secondary | ICD-10-CM | POA: Diagnosis not present

## 2018-10-20 DIAGNOSIS — R05 Cough: Secondary | ICD-10-CM | POA: Insufficient documentation

## 2018-10-20 DIAGNOSIS — Z888 Allergy status to other drugs, medicaments and biological substances status: Secondary | ICD-10-CM | POA: Insufficient documentation

## 2018-10-20 DIAGNOSIS — Z9049 Acquired absence of other specified parts of digestive tract: Secondary | ICD-10-CM | POA: Diagnosis not present

## 2018-10-20 DIAGNOSIS — Z8669 Personal history of other diseases of the nervous system and sense organs: Secondary | ICD-10-CM | POA: Diagnosis not present

## 2018-10-20 DIAGNOSIS — Z885 Allergy status to narcotic agent status: Secondary | ICD-10-CM | POA: Diagnosis not present

## 2018-10-20 DIAGNOSIS — Z9841 Cataract extraction status, right eye: Secondary | ICD-10-CM | POA: Insufficient documentation

## 2018-10-20 DIAGNOSIS — F329 Major depressive disorder, single episode, unspecified: Secondary | ICD-10-CM | POA: Diagnosis not present

## 2018-10-20 DIAGNOSIS — F419 Anxiety disorder, unspecified: Secondary | ICD-10-CM | POA: Diagnosis not present

## 2018-10-20 DIAGNOSIS — Z87891 Personal history of nicotine dependence: Secondary | ICD-10-CM | POA: Diagnosis not present

## 2018-10-20 DIAGNOSIS — Z6832 Body mass index (BMI) 32.0-32.9, adult: Secondary | ICD-10-CM | POA: Diagnosis not present

## 2018-10-20 DIAGNOSIS — Z880 Allergy status to penicillin: Secondary | ICD-10-CM | POA: Diagnosis not present

## 2018-10-20 DIAGNOSIS — E668 Other obesity: Secondary | ICD-10-CM | POA: Diagnosis not present

## 2018-10-20 DIAGNOSIS — I10 Essential (primary) hypertension: Secondary | ICD-10-CM | POA: Diagnosis not present

## 2018-10-20 DIAGNOSIS — H2512 Age-related nuclear cataract, left eye: Secondary | ICD-10-CM | POA: Diagnosis not present

## 2018-10-20 DIAGNOSIS — Z86718 Personal history of other venous thrombosis and embolism: Secondary | ICD-10-CM | POA: Diagnosis not present

## 2018-10-20 DIAGNOSIS — M069 Rheumatoid arthritis, unspecified: Secondary | ICD-10-CM | POA: Insufficient documentation

## 2018-10-20 DIAGNOSIS — Z886 Allergy status to analgesic agent status: Secondary | ICD-10-CM | POA: Insufficient documentation

## 2018-10-20 HISTORY — DX: Cough, unspecified: R05.9

## 2018-10-20 HISTORY — DX: Cough: R05

## 2018-10-20 HISTORY — PX: CATARACT EXTRACTION W/PHACO: SHX586

## 2018-10-20 SURGERY — PHACOEMULSIFICATION, CATARACT, WITH IOL INSERTION
Anesthesia: Monitor Anesthesia Care | Site: Eye | Laterality: Left

## 2018-10-20 MED ORDER — CYCLOPENTOLATE HCL 2 % OP SOLN
1.0000 [drp] | OPHTHALMIC | Status: AC
  Administered 2018-10-20 (×4): 1 [drp] via OPHTHALMIC

## 2018-10-20 MED ORDER — ONDANSETRON HCL 4 MG/2ML IJ SOLN
INTRAMUSCULAR | Status: AC
  Filled 2018-10-20: qty 2

## 2018-10-20 MED ORDER — EPINEPHRINE PF 1 MG/ML IJ SOLN
INTRAMUSCULAR | Status: AC
  Filled 2018-10-20: qty 1

## 2018-10-20 MED ORDER — FENTANYL CITRATE (PF) 100 MCG/2ML IJ SOLN
INTRAMUSCULAR | Status: AC
  Filled 2018-10-20: qty 2

## 2018-10-20 MED ORDER — TETRACAINE HCL 0.5 % OP SOLN
1.0000 [drp] | OPHTHALMIC | Status: DC | PRN
  Administered 2018-10-20 (×4): 1 [drp] via OPHTHALMIC

## 2018-10-20 MED ORDER — CARBACHOL 0.01 % IO SOLN
INTRAOCULAR | Status: DC | PRN
  Administered 2018-10-20: .5 mL via INTRAOCULAR

## 2018-10-20 MED ORDER — POVIDONE-IODINE 5 % OP SOLN
OPHTHALMIC | Status: AC
  Filled 2018-10-20: qty 30

## 2018-10-20 MED ORDER — LIDOCAINE HCL (PF) 4 % IJ SOLN
INTRAOCULAR | Status: DC | PRN
  Administered 2018-10-20: 2 mL via OPHTHALMIC

## 2018-10-20 MED ORDER — NA CHONDROIT SULF-NA HYALURON 40-17 MG/ML IO SOLN
INTRAOCULAR | Status: DC | PRN
  Administered 2018-10-20: 1 mL via INTRAOCULAR

## 2018-10-20 MED ORDER — POLYMYXIN B-TRIMETHOPRIM 10000-0.1 UNIT/ML-% OP SOLN
OPHTHALMIC | Status: DC | PRN
  Administered 2018-10-20: 1 [drp] via OPHTHALMIC

## 2018-10-20 MED ORDER — LIDOCAINE HCL 3.5 % OP GEL: 1.0000 "application " | Freq: Once | OPHTHALMIC | Status: DC

## 2018-10-20 MED ORDER — PHENYLEPHRINE HCL 10 % OP SOLN
1.0000 [drp] | OPHTHALMIC | Status: AC
  Administered 2018-10-20 (×4): 1 [drp] via OPHTHALMIC

## 2018-10-20 MED ORDER — POLYMYXIN B-TRIMETHOPRIM 10000-0.1 UNIT/ML-% OP SOLN: 1.0000 [drp] | OPHTHALMIC | Status: DC | PRN

## 2018-10-20 MED ORDER — POVIDONE-IODINE 5 % OP SOLN
OPHTHALMIC | Status: DC | PRN
  Administered 2018-10-20: 1 via OPHTHALMIC

## 2018-10-20 MED ORDER — NA CHONDROIT SULF-NA HYALURON 40-17 MG/ML IO SOLN
INTRAOCULAR | Status: AC
  Filled 2018-10-20: qty 1

## 2018-10-20 MED ORDER — EPINEPHRINE PF 1 MG/ML IJ SOLN
INTRAOCULAR | Status: DC | PRN
  Administered 2018-10-20: 1 mL via OPHTHALMIC

## 2018-10-20 MED ORDER — LIDOCAINE HCL (PF) 4 % IJ SOLN
INTRAMUSCULAR | Status: AC
  Filled 2018-10-20: qty 5

## 2018-10-20 MED ORDER — CYCLOPENTOLATE HCL 2 % OP SOLN
OPHTHALMIC | Status: AC
  Administered 2018-10-20: 1 [drp] via OPHTHALMIC
  Filled 2018-10-20: qty 2

## 2018-10-20 MED ORDER — PHENYLEPHRINE HCL 10 % OP SOLN
OPHTHALMIC | Status: AC
  Administered 2018-10-20: 1 [drp] via OPHTHALMIC
  Filled 2018-10-20: qty 5

## 2018-10-20 MED ORDER — MIDAZOLAM HCL 2 MG/2ML IJ SOLN
INTRAMUSCULAR | Status: DC | PRN
  Administered 2018-10-20: 0.5 mg via INTRAVENOUS
  Administered 2018-10-20: 1 mg via INTRAVENOUS

## 2018-10-20 MED ORDER — FENTANYL CITRATE (PF) 100 MCG/2ML IJ SOLN
INTRAMUSCULAR | Status: DC | PRN
  Administered 2018-10-20: 50 ug via INTRAVENOUS

## 2018-10-20 MED ORDER — MIDAZOLAM HCL 2 MG/2ML IJ SOLN
INTRAMUSCULAR | Status: AC
  Filled 2018-10-20: qty 2

## 2018-10-20 MED ORDER — ONDANSETRON HCL 4 MG/2ML IJ SOLN
INTRAMUSCULAR | Status: DC | PRN
  Administered 2018-10-20: 4 mg via INTRAVENOUS

## 2018-10-20 MED ORDER — TETRACAINE HCL 0.5 % OP SOLN
OPHTHALMIC | Status: AC
  Administered 2018-10-20: 1 [drp] via OPHTHALMIC
  Filled 2018-10-20: qty 4

## 2018-10-20 MED ORDER — POLYMYXIN B-TRIMETHOPRIM 10000-0.1 UNIT/ML-% OP SOLN
OPHTHALMIC | Status: AC
  Filled 2018-10-20: qty 10

## 2018-10-20 MED ORDER — SODIUM CHLORIDE 0.9 % IV SOLN
INTRAVENOUS | Status: DC
  Administered 2018-10-20: 10:00:00 via INTRAVENOUS

## 2018-10-20 SURGICAL SUPPLY — 18 items
GLOVE BIO SURGEON STRL SZ8 (GLOVE) ×2 IMPLANT
GLOVE BIOGEL M 6.5 STRL (GLOVE) ×2 IMPLANT
GLOVE SURG LX 8.0 MICRO (GLOVE) ×1
GLOVE SURG LX STRL 8.0 MICRO (GLOVE) ×1 IMPLANT
GOWN STRL REUS W/ TWL LRG LVL3 (GOWN DISPOSABLE) ×2 IMPLANT
GOWN STRL REUS W/TWL LRG LVL3 (GOWN DISPOSABLE) ×2
LABEL CATARACT MEDS ST (LABEL) ×2 IMPLANT
LENS IOL ACRSF IQ TRC 4 25.5 IMPLANT
LENS IOL ACRYSOF IQ TORIC 25.5 ×1 IMPLANT
LENS IOL IQ TORIC 4 25.5 ×1 IMPLANT
PACK CATARACT (MISCELLANEOUS) ×2 IMPLANT
PACK CATARACT BRASINGTON LX (MISCELLANEOUS) ×2 IMPLANT
PACK EYE AFTER SURG (MISCELLANEOUS) ×2 IMPLANT
SOL BSS BAG (MISCELLANEOUS) ×2
SOLUTION BSS BAG (MISCELLANEOUS) ×1 IMPLANT
SYR 5ML LL (SYRINGE) ×2 IMPLANT
WATER STERILE IRR 250ML POUR (IV SOLUTION) ×2 IMPLANT
WIPE NON LINTING 3.25X3.25 (MISCELLANEOUS) ×2 IMPLANT

## 2018-10-20 NOTE — Anesthesia Procedure Notes (Signed)
Procedure Name: MAC Performed by: Vaughan Sine Pre-anesthesia Checklist: Patient identified, Emergency Drugs available, Suction available, Patient being monitored and Timeout performed Patient Re-evaluated:Patient Re-evaluated prior to induction Oxygen Delivery Method: Nasal cannula Preoxygenation: Pre-oxygenation with 100% oxygen Induction Type: IV induction Placement Confirmation: positive ETCO2 and CO2 detector

## 2018-10-20 NOTE — Anesthesia Postprocedure Evaluation (Signed)
Anesthesia Post Note  Patient: Angela Sherman  Procedure(s) Performed: CATARACT EXTRACTION PHACO AND INTRAOCULAR LENS PLACEMENT (IOC) (Left Eye)  Patient location during evaluation: PACU Anesthesia Type: MAC Level of consciousness: awake and alert and oriented Pain management: pain level controlled Vital Signs Assessment: post-procedure vital signs reviewed and stable Respiratory status: spontaneous breathing, nonlabored ventilation and respiratory function stable Cardiovascular status: blood pressure returned to baseline and stable Postop Assessment: no signs of nausea or vomiting Anesthetic complications: no     Last Vitals:  Vitals:   10/20/18 0913 10/20/18 1024  BP: (!) 143/107   Pulse: 70 68  Resp: 20 18  Temp: (!) 36.2 C (!) 36.4 C  SpO2: 98% 97%    Last Pain:  Vitals:   10/20/18 1024  TempSrc: Temporal  PainSc: 0-No pain                 Aaryana Betke

## 2018-10-20 NOTE — Transfer of Care (Signed)
Immediate Anesthesia Transfer of Care Note  Patient: Angela Sherman  Procedure(s) Performed: CATARACT EXTRACTION PHACO AND INTRAOCULAR LENS PLACEMENT (IOC) (Left Eye)  Patient Location: PACU  Anesthesia Type:MAC  Level of Consciousness: awake, alert  and oriented  Airway & Oxygen Therapy: Patient Spontanous Breathing  Post-op Assessment: Report given to RN and Post -op Vital signs reviewed and stable  Post vital signs: Reviewed and stable  Last Vitals:  Vitals Value Taken Time  BP    Temp    Pulse    Resp    SpO2      Last Pain:  Vitals:   10/20/18 0913  TempSrc: Temporal         Complications: No apparent anesthesia complications

## 2018-10-20 NOTE — Discharge Instructions (Addendum)
Eye Surgery Discharge Instructions    Expect mild scratchy sensation or mild soreness. DO NOT RUB YOUR EYE!  The day of surgery:  Minimal physical activity, but bed rest is not required  No reading, computer work, or close hand work  No bending, lifting, or straining.  May watch TV  For 24 hours:  No driving, legal decisions, or alcoholic beverages  Safety precautions  Eat anything you prefer: It is better to start with liquids, then soup then solid foods.   Solar shield eyeglasses should be worn for comfort in the sunlight/patch while sleeping  Resume all regular medications including aspirin or Coumadin if these were discontinued prior to surgery. You may shower, bathe, shave, or wash your hair. Tylenol may be taken for mild discomfort. FOLLOW DR. PORFILIO'S EYE DROP INSTRUCTION SHEET AS REVIEWED.  Call your doctor if you experience significant pain, nausea, or vomiting, fever > 101 or other signs of infection. [phone] or [phone] Specific instructions:  Follow-up Information    Galen Manila, MD Follow up.   Specialty:  Ophthalmology Why:  10/21/18 @ 10:55 am Contact information: [address] [place] [address] [phone]

## 2018-10-20 NOTE — Anesthesia Post-op Follow-up Note (Signed)
Anesthesia QCDR form completed.        

## 2018-10-20 NOTE — Anesthesia Preprocedure Evaluation (Signed)
Anesthesia Evaluation  Patient identified by MRN, date of birth, ID band Patient awake    Reviewed: Allergy & Precautions, NPO status , Patient's Chart, lab work & pertinent test results  History of Anesthesia Complications (+) PONV and history of anesthetic complications  Airway Mallampati: III  TM Distance: >3 FB Neck ROM: Full    Dental no notable dental hx.    Pulmonary neg sleep apnea, neg COPD, former smoker,           Cardiovascular hypertension, Pt. on medications (-) CAD, (-) Past MI, (-) Cardiac Stents and (-) CABG      Neuro/Psych  Headaches, PSYCHIATRIC DISORDERS Anxiety Depression    GI/Hepatic Neg liver ROS, GERD  ,  Endo/Other  neg diabetesHypothyroidism   Renal/GU negative Renal ROS     Musculoskeletal  (+) Arthritis ,   Abdominal (+) + obese,   Peds  Hematology negative hematology ROS (+)   Anesthesia Other Findings Past Medical History: No date: Arthritis     Comment:  RA No date: Cough     Comment:  CHRONIC No date: Depression No date: GERD (gastroesophageal reflux disease) No date: Headache     Comment:  H/O MIGRAINES No date: History of deep vein thrombosis     Comment:  Right calf 2002?: History of peritonitis     Comment:  Right calf. No date: Hypertension No date: Hypothyroidism No date: PONV (postoperative nausea and vomiting)     Comment:  NO PROBLEM WITH CATARACT SURGERY No date: Thyroid disease   Reproductive/Obstetrics                             Anesthesia Physical Anesthesia Plan  ASA: II  Anesthesia Plan: MAC   Post-op Pain Management:    Induction: Intravenous  PONV Risk Score and Plan: 2 and Midazolam  Airway Management Planned: Natural Airway  Additional Equipment:   Intra-op Plan:   Post-operative Plan:   Informed Consent: I have reviewed the patients History and Physical, chart, labs and discussed the procedure including the  risks, benefits and alternatives for the proposed anesthesia with the patient or authorized representative who has indicated his/her understanding and acceptance.     Plan Discussed with: CRNA and Anesthesiologist  Anesthesia Plan Comments:         Anesthesia Quick Evaluation

## 2018-10-20 NOTE — H&P (Signed)
All labs reviewed. Abnormal studies sent to patients PCP when indicated.  Previous H&P reviewed, patient examined, there are NO CHANGES.  Angela Zobrist Porfilio10/22/20199:58 AM

## 2018-10-20 NOTE — Op Note (Signed)
PREOPERATIVE DIAGNOSIS:  Nuclear sclerotic cataract of the left eye.   POSTOPERATIVE DIAGNOSIS:  Nuclear sclerotic cataract of the left eye.   OPERATIVE PROCEDURE: Procedure(s): CATARACT EXTRACTION PHACO AND INTRAOCULAR LENS PLACEMENT (IOC)   SURGEON:  Galen Manila, MD.   ANESTHESIA: 1.      Managed anesthesia care. 2.     0.34ml os Shugarcaine was instilled following the paracentesis [email or web address]@   COMPLICATIONS:  None.   TECHNIQUE:   Stop and chop    DESCRIPTION OF PROCEDURE:  The patient was examined and consented in the preoperative holding area where the aforementioned topical anesthesia was applied to the left eye.  The patient was brought back to the Operating Room where he was sat upright on the gurney and given a target to fixate upon while the eye was marked at the 3:00 and 9:00 position.  The patient was then reclined on the operating table.  The eye was prepped and draped in the usual sterile ophthalmic fashion and a lid speculum was placed. A paracentesis was created with the side port blade and the anterior chamber was filled with viscoelastic. A near clear corneal incision was performed with the steel keratome. A continuous curvilinear capsulorrhexis was performed with a cystotome followed by the capsulorrhexis forceps. Hydrodissection and hydrodelineation were carried out with BSS on a blunt cannula. The lens was removed in a stop and chop technique and the remaining cortical material was removed with the irrigation-aspiration handpiece. The eye was inflated with viscoelastic and the Alcon lens was placed in the eye and rotated to within a few degrees of the predetermined orientation.  The remaining viscoelastic was removed from the eye.  The Sinskey hook was used to rotate the toric lens into its final resting place at 098 degrees.  The eye was inflated to a physiologic pressure and found to be watertight.  The eye was dressed with Polytrim. The patient was given protective  glasses to wear throughout the day and a shield with which to sleep tonight. The patient was also given drops with which to begin a drop regimen today and will follow-up with me in one day. Implant Name Type Inv. Item Serial No. Manufacturer Lot No. LRB No. Used  LENS IOL TORIC 25.5 - [id number]  LENS IOL TORIC 25.5 [id number] ALCON  Left 1   Procedure(s) with comments: CATARACT EXTRACTION PHACO AND INTRAOCULAR LENS PLACEMENT (IOC) (Left) - [name] 00:23 CDE 3.50 Fluid pack lot # [id number]  Electronically signed: Galen Manila 10/22/201910:23 AM

## 2018-11-10 ENCOUNTER — Other Ambulatory Visit: Payer: Self-pay | Admitting: Family Medicine

## 2018-11-10 MED ORDER — TRIAMTERENE-HCTZ 37.5-25 MG PO TABS: 1.0000 | ORAL_TABLET | Freq: Every day | ORAL | 3 refills | Status: DC

## 2018-11-10 NOTE — Telephone Encounter (Signed)
[facility] faxed refill request for the following medications:   triamterene-hydrochlorothiazide (MAXZIDE-25) 37.5-25 MG tablet    Please advise.  

## 2018-11-10 NOTE — Telephone Encounter (Signed)
Please review. Thanks!  

## 2018-11-11 MED ORDER — PREDNISONE 5 MG TABLET
ORAL_TABLET | 0 refills | 0 days | Status: CP
Start: 2018-11-11 — End: 2019-01-05

## 2019-01-05 ENCOUNTER — Ambulatory Visit: Admit: 2019-01-05 | Discharge: 2019-01-06 | Payer: MEDICARE | Attending: Rheumatology | Primary: Rheumatology

## 2019-01-05 DIAGNOSIS — M0579 Rheumatoid arthritis with rheumatoid factor of multiple sites without organ or systems involvement: Principal | ICD-10-CM

## 2019-01-05 MED ORDER — PREDNISONE 5 MG TABLET
ORAL_TABLET | Freq: Every day | ORAL | 3 refills | 0 days | Status: CP
Start: 2019-01-05 — End: ?

## 2019-01-05 NOTE — Unmapped (Signed)
Primary care MD: Dr. [name]    Consulting physician: Dr. Jarold Motto    HPI: Pamela Mercer is a 68 yo woman with longstanding RF/CCP+ RA. She returns today for routine followup.  She remains on low dose prednisone 5 mg daily.     Previous therapies have included:  MTX-- D/C for ? Pulmonary toxicity  Enbrel  D/C in 2011 'making her sick'-- Not sure when it was started.  She came to me in 2008 on this.   Orencia (2011-2014) D/C due to cost    Has also taken oral gold, leflunomide, HCQ.  Humira--> injection site reactions.  Remicade--> peritonitis    At her last visit we discussed going back on Orencia.  She decided ultimately that she did not want to go on it.  She remains concerned about long term side effects and does not want to increase her risk of infections.     She reports that she has not had any new joints affected.  Last week she did not feel well due to the changes in the weather.  Other than that, she is very stable.     She is now S/P bilateral cataract surgery.  She is happy with the results.  She has to wear reading glasses now.     She has not had any fever, chills, or night sweats.  No oral ulcers, sicca symptoms.      Immunization History   Administered Date(s) Administered   ??? Influenza Vaccine Quad (IIV4 PF) 54mo+ injectable 12/03/2016, 09/18/2017   ??? Influenza Virus Vaccine, unspecified formulation 10/13/2014, 10/20/2015,  2019 at [facility]   ??? PNEUMOCOCCAL POLYSACCHARIDE 23 10/24/2009, 01/10/2015   ??? Pneumococcal Conjugate 13-Valent 07/26/2014   ??? TdaP 11/03/2007, 12/09/2011         Medication Sig   ??? cholecalciferol, vitamin D3, (CHOLECALCIFEROL) 1,000 unit tablet Take 1,000 Units by mouth.   ??? DULoxetine (CYMBALTA) 30 MG capsule TAKE 1 CAPSULE BY MOUTH EVERY DAY   ??? levothyroxine (SYNTHROID, LEVOTHROID) 75 MCG tablet TAKE 1 TABLET BY MOUTH EVERY DAY   ??? lysine 1,000 mg Tab Take 1 tablet by mouth daily.   ??? omeprazole (PRILOSEC) 20 MG capsule TAKE 1 CAPSULE(20 MG) BY MOUTH DAILY   ??? predniSONE (DELTASONE) 5 MG tablet TAKE 1 TABLET EVERY DAY   ??? triamterene-hydrochlorothiazide (MAXZIDE-25) 37.5-25 mg per tablet Take 0.5 tablets by mouth daily.        Allergies   Allergen Reactions   ??? Infliximab Other (See Comments) and Anaphylaxis     Cough, rash, kidneys pulsing. vomiting   ??? Shellfish Containing Products Rash and Swelling   ??? Nsaids (Non-Steroidal Anti-Inflammatory Drug)      clinoril   ??? Tolmetin    ??? Ciprofloxacin Rash     Other reaction(s): UNKNOWN   ??? Codeine Nausea And Vomiting     Severe vomiting  Severe vomiting   ??? Meperidine Nausea And Vomiting   ??? Methotrexate Nausea And Vomiting and Rash   ??? Penicillins Rash   ??? Sulindac Rash     ROS; 10 systems are reviewed and are negative except that mentioned in the HPI    EXAMINATION  BP 158/99 (BP Site: L Arm, BP Position: Sitting, BP Cuff Size: Medium)  - Pulse 94  - Temp 36.2 ??C (97.1 ??F) (Oral)  - Ht 167.6 cm (5' 5.98)  - Wt 89.6 kg (197 lb 8 oz)  - BMI 31.89 kg/m??   GEN: NAD, A+Ox3   MSK: Good ROM of  the neck.  Decreased abduction of the L shoulder.  No glenohumeral effusions.  10 degrees of flexion of the L elbow.  R elbow is fine. Diffuse MCP synovitis.  Fusion of the R 2nd MCP. Subluxation of the L 2nd MCP.  Good ROM of the hips.  Crepitus of bothe knees.  Diffuse forefoot deformities without synovitis.    CVS: Tachycardic with regular rhythm, no murmurs, rubs or gallops   PULM: clear to auscultation bilateral, symmetric breath sounds. no stridor or wheeze.   EXT: no edema bilaterally, + symmetrical pulses bilateral       IMAGING  DXA March 2018  The bone mineral density in the spine measuring L1 to 4 measures 0.888 gm/cm2. ??The ??Z score is 0.2 and the T score is -1.4. ??This value is above the fracture risk threshold.  The total bone mineral density in the proximal left femur measures 0.777 gm/cm2. ??The Z score is -0.2 and the T score is -1.4. ??This value is above the fracture risk threshold. ??The femoral neck density is 0.700 gm/cm2, and the T score is -1.3. ??The other T scores range from -2.0 to -0.9.    HANDS March 2019  RIGHT: Patient is status post first and second metacarpophalangeal joint arthrodeses. Hardware appears intact. No perihardware lucency or fracture. There is persistent diffuse osteopenia and no significant change in marginal erosions of the second through fourth metacarpophalangeal joints and proximal interphalangeal joints. Interval osseous remodeling at the distal ulna.  LEFT: Unchanged sequelae of first and second arthrodesis with plate and screws with no perihardware lucency or fracture. No significant change in erosion along the radial aspect of the left third metacarpal head. No significant change in multifocal PIP joint space narrowing with marginal erosions. Ulnar styloid process erosions are also unchanged.    FEET March 2019  LEFT: There are marginal osseous erosions at the MTP joints. Additionally there are osseous erosions at the medial head of the first proximal phalanx and base of the first distal phalanx which are new from prior. Similar appearance of joint space narrowing and lateral angulation/mild subluxation at the second through fourth MTP joints. Joint spaces are otherwise preserved. Multiple, interphalangeal joint flexion deformities. There is a small plantar calcaneal enthesophyte.  RIGHT: Redemonstration of marginal erosions along the MTP joints and distal first phalanx. Unchanged large lucency in the first proximal phalanx, which may represent erosive change. ??Increased lateral angulation deformities at the MTP joints. The joint spaces are otherwise preserved. There is a small plantar calcaneal enthesophyte.    CxR March 2019  Clear lung fields    Assessment and Plan:   Pamela Mercer is a 68 year old woman with longstanding seropositive erosive rheumatoid arthritis. She has some disease activity and a lot of articular damage.  She has opted not to be on any DMARD therapy due to concers for long term toxicity.   She is doing OK on prednisone monotherapy. We will continue prednisone 5 mg daily.     I have again recommended the [facility] vaccine and have advised that she had that at a local pharmacy.    F/U will be in 1 year or sooner as needed.

## 2019-01-05 NOTE — Unmapped (Signed)
Continue '5mg'$  prednisone daily

## 2019-02-15 ENCOUNTER — Ambulatory Visit: Admit: 2019-02-15 | Discharge: 2019-02-16 | Payer: MEDICARE | Attending: Dermatology | Primary: Dermatology

## 2019-02-15 DIAGNOSIS — L820 Inflamed seborrheic keratosis: Principal | ICD-10-CM

## 2019-02-15 NOTE — Unmapped (Signed)
Dermatology Clinic Note    Assessment and Plan:    Personal history of NMSC:   -No evidence of recurrence at this time.   -Reinforced photoprotective strategies (sunscreen use, protective clothing, and sun avoidance)  -Discussed importance of regular self-monitoring and yearly clinical skin exams.   -Discussed to return sooner should any new, changing, or symptomatic lesions develop    Benign lesions Seborrheic Keratoses-some irritated, Benign-Appearing Nevi, Cherry Angiomata and Lentigines:  -Lesion(s) of concern: Porokeratosis of the right lower leg; ISK of the left temple and back x 6  -Reassurance of benign nature of the lesions provided  -Discussed ABCDEs of pigmented lesions  -Encouraged regular self-skin monitoring and annual clinical examinations  -Encouraged sun protection practices    Inflamed seborrheic keratoses of the left temple and back x 6 and Porokeratosis of the right lower leg  -Benign nature of the lesions explained but remain symptomatic for patient  -Cryotherapy Procedure note: After discussion of risks, benefits, alternatives, including but not limited to scar, post-inflammatory pigment changes, infection, blister, pain, recurrence, and necessity of further treatment, patient agreed to treatment and verbal consent was obtained. Applied liquid nitrogen x 5-10 seconds for 1  freeze-thaw cycle to 7 lesions. Patient tolerated the procedure well and was instructed on post-procedure care.    Intertrigo beneath the abdomen:  -Discussed risks/benefits of diagnostic/management options  -Can use cornstarch or Zeasorb powder several times daily  -If becomes raw, irritated, switch to zinc oxide barrier cream      RTC: Return in about 1 year (around 02/16/2020) for Full-body skin check. or sooner as needed.      CC:   Skin Examination and Evaluation of Lesions of Concern    HPI:  [name] is a 68 y.o. female who was seen today by Dr. Barbette Or, MD in consultation at the request of Dr. Doyce Loose for evaluation of lesions of concern and FBSE.    Lesions of concern:    -Scaly lesions of the forehead and back: present for months; sometimes itching; Denies any history of bleeding, drainage, pain; no prior treatment    Denies any other skin concerns, including no other areas with bleeding, drainage, pain, pruritus, growth, or change.     Past Medical History  -PMHX of Rheumatoid arthritis  -PMHX Positive for NMSC; Negative for melanoma;  -History of NMSC from outside dermatologist [facility])    Family History  -FMHX Negative for NMSC; and Negative for melanoma    Social History,   -Lives in   [address]  [place] [address]    ROS:   Baseline state of health. No other skin complaints except as noted in HPI. All other systems reviewed are negative.    PE:  Gen: No acute distress  Neuro: Alert and oriented; answers questions appropriately for age  Skin: Per patient request, examination limited to inspection and palpation of the scalp, face, neck, chest, back, bilateral upper and lower extremities, palms, soles, nails, and buttocks and significant for the below (all other areas examined were normal or had no significant findings):    -Multiple vascular papules that are blanchable and dome-shaped scattered over the trunk>extremities   -Scattered 6-20 mm pigmented macules that are tan to brown in color and are somewhat non-uniform in shape and concentrated in the sun-exposed areas    -Multiple hyperkeratotic, well-demarcated, stuck-on pigmented to skin-colored 6-20 mm papules and plaques scattered predominantly over the trunk and extremities, including of the left forehead/temple and  back  -right lower leg shows a 8mm annular plaque with double-walled scale at the rim and well-defined thin furrows running in its centre.  -Multiple small to medium-sized, well-demarcated, symmetric, uniformly pigmented papules with regular borders scattered over trunk, extremities, and face  -Well-healed scars at prior skin cancer treatment sites on the back without evidence of local recurrence    -All other areas examined within normal limits without any skin lesions of concern    The patient was seen and examined by Dr. Orma Flaming who agrees with the assessment and plan as above.

## 2019-02-15 NOTE — Unmapped (Addendum)
Intertrigo beneath the abdomen:  -Can use cornstarch or Zeasorb powder several times daily  -If becomes raw, irritated, switch to zinc oxide barrier cream    SKIN MONITORING RECOMMENDATIONS  Please briefly monitor your skin monthly for any changes and have a spot evaluated if any of the following are encountered:  -Areas that bleed spontaneously  -Areas that fail to heal  -Areas present >3-4 weeks   -Areas with any abnormal features according to the ABCDEs (particularly but spots that have color)    ABCDEs  A: Asymmetry  B: Border (irregular)  C: Color (multiple)  D: Diameter greater than pencil eraser  E: Evolution (change) = most important    Patient Education        Seborrheic Keratosis: Care Instructions  Your Care Instructions  Seborrheic keratoses are raised skin growths that look scaly or warty. They usually look like they were stuck onto the skin. They most often grow in groups on the back or chest and are more common in older people. A seborrheic keratosis can be tan or dark brown. A seborrheic keratosis is not a mole and is almost always harmless. But it is still a good idea to check your skin regularly.  Sometimes a seborrheic keratosis can itch. Scratching it can cause it to bleed and sometimes even scar.  A seborrheic keratosis is removed only if it bothers you. The doctor will freeze it or scrape it off with a tool. The doctor can also use a laser to remove a seborrheic keratosis. Treatment usually results in normal-looking skin, but it can leave a light or dark Sherree Shankman or even a scar on the skin.  Follow-up care is a key part of your treatment and safety. Be sure to make and go to all appointments, and call your doctor if you are having problems. It's also a good idea to know your test results and keep a list of the medicines you take.  How can you care for yourself at home?  ?? If clothing irritates your seborrheic keratosis, cover it with a bandage to prevent rubbing and bleeding.  ?? If you have a seborrheic keratosis removed, clean the area with soap and water two times a day unless your doctor gives you different instructions. Don't use hydrogen peroxide or alcohol, which can slow healing.  ? You may cover the wound with a thin layer of petroleum jelly, such as Vaseline, and a nonstick bandage.  ?? Check all the skin on your body once a month for skin growths or other changes, such as color and feel of the skin.  ? Stand in front of a full-length mirror. Look carefully at the front and back of your body. Then look at your right and left sides with your arms raised.  ? Bend your elbows and look carefully at your forearms, the back of your upper arms, and your palms.  ? Look at your feet, the soles of your feet, and the spaces between your toes.  ? Use a hand mirror to look at the back of your legs, the back of your neck, and your back, rear end (buttocks), and genital area. Part the hair on your head to look at your scalp.  ?? If you see a change in a skin growth, contact your doctor. Look for:  ? A mole that bleeds.  ? A fast-growing mole.  ? A scaly or crusted growth on the skin.  ? A sore that will not heal.  When should you call  for help?  Call your doctor now or seek immediate medical care if:  ?? ?? You have an area of normal skin that suddenly changes in shape, size, or how it looks.   ?? ?? Your skin is badly broken from scratching.   ?? ?? You have signs of infection such as:  ? Pain, warmth, or swelling in your skin.  ? Red streaks near a wound in your skin.  ? Pus coming from a wound in your skin.  ? A fever not due to the flu or other illness.   ??Watch closely for changes in your health, and be sure to contact your doctor if:  ?? ?? You do not get better as expected.   Where can you learn more?  Go to [facility] at https://myuncchart.org  [facility] under [employer]. Enter [phone] in the search box to learn more about Seborrheic Keratosis: Care Instructions.  Current as of: March 30, 2018 Content Version: 12.3  ?? 2006-2019 [employer]. Care instructions adapted under license by [facility]. If you have questions about a medical condition or this instruction, always ask your healthcare professional. [employer] disclaims any warranty or liability for your use of this information.         Cryosurgery  Cryosurgery (???freezing???) uses liquid nitrogen to destroy certain types of skin lesions. Lowering the temperature of the lesion in a small area surrounding skin destroys the lesion. Immediately following cryosurgery, you will notice redness and swelling of the treatment area. Blistering or weeping may occur, lasting approximately one week which will then be followed by crusting. Most areas will heal completely in 10 to 14 days.    Wash the treated areas daily. Allow soap and water to run over the areas, but do not scrub. Should a scab or crust form, allow it to fall off on its own. Do not remove or pick at it. Application of an ointment  and a bandage may make you feel more comfortable, but it is not necessary. Some people develop an allergy to Neosporin, so we recommend that Vaseline or  Aquaphor be used.    The cryotherapy site will be more sensitive than your surrounding skin. Keep it covered, and remember to apply sunscreen every day to all your sun exposed skin. A scar may remain which is lighter or pinker than your normal skin. Your body will continue to improve your scar for up to one year; however a light-colored scar may remain.    Infection following cryotherapy is rare. However if you are worried about the appearance of the treated area, contact your doctor. We have a physician on call at all times. If you have any concerns about the site, please call our clinic at [phone]

## 2019-02-17 ENCOUNTER — Ambulatory Visit: Payer: Self-pay | Admitting: Family Medicine

## 2019-03-25 ENCOUNTER — Other Ambulatory Visit: Payer: Self-pay | Admitting: Family Medicine

## 2019-03-25 DIAGNOSIS — K219 Gastro-esophageal reflux disease without esophagitis: Secondary | ICD-10-CM

## 2019-05-09 ENCOUNTER — Other Ambulatory Visit: Payer: Self-pay | Admitting: Family Medicine

## 2019-05-17 DIAGNOSIS — M3501 Sicca syndrome with keratoconjunctivitis: Secondary | ICD-10-CM | POA: Diagnosis not present

## 2019-07-21 DIAGNOSIS — M4604 Spinal enthesopathy, thoracic region: Secondary | ICD-10-CM | POA: Diagnosis not present

## 2019-07-21 DIAGNOSIS — M9901 Segmental and somatic dysfunction of cervical region: Secondary | ICD-10-CM | POA: Diagnosis not present

## 2019-07-21 DIAGNOSIS — M47892 Other spondylosis, cervical region: Secondary | ICD-10-CM | POA: Diagnosis not present

## 2019-07-21 DIAGNOSIS — M9902 Segmental and somatic dysfunction of thoracic region: Secondary | ICD-10-CM | POA: Diagnosis not present

## 2019-07-26 DIAGNOSIS — M9901 Segmental and somatic dysfunction of cervical region: Secondary | ICD-10-CM | POA: Diagnosis not present

## 2019-07-26 DIAGNOSIS — M9902 Segmental and somatic dysfunction of thoracic region: Secondary | ICD-10-CM | POA: Diagnosis not present

## 2019-07-26 DIAGNOSIS — M47892 Other spondylosis, cervical region: Secondary | ICD-10-CM | POA: Diagnosis not present

## 2019-07-26 DIAGNOSIS — M4604 Spinal enthesopathy, thoracic region: Secondary | ICD-10-CM | POA: Diagnosis not present

## 2019-08-03 DIAGNOSIS — M4604 Spinal enthesopathy, thoracic region: Secondary | ICD-10-CM | POA: Diagnosis not present

## 2019-08-03 DIAGNOSIS — M47892 Other spondylosis, cervical region: Secondary | ICD-10-CM | POA: Diagnosis not present

## 2019-08-03 DIAGNOSIS — M9901 Segmental and somatic dysfunction of cervical region: Secondary | ICD-10-CM | POA: Diagnosis not present

## 2019-08-03 DIAGNOSIS — M9902 Segmental and somatic dysfunction of thoracic region: Secondary | ICD-10-CM | POA: Diagnosis not present

## 2019-08-05 DIAGNOSIS — M4604 Spinal enthesopathy, thoracic region: Secondary | ICD-10-CM | POA: Diagnosis not present

## 2019-08-05 DIAGNOSIS — M47892 Other spondylosis, cervical region: Secondary | ICD-10-CM | POA: Diagnosis not present

## 2019-08-05 DIAGNOSIS — M9901 Segmental and somatic dysfunction of cervical region: Secondary | ICD-10-CM | POA: Diagnosis not present

## 2019-08-05 DIAGNOSIS — M9902 Segmental and somatic dysfunction of thoracic region: Secondary | ICD-10-CM | POA: Diagnosis not present

## 2019-08-07 ENCOUNTER — Other Ambulatory Visit: Payer: Self-pay | Admitting: Family Medicine

## 2019-08-09 DIAGNOSIS — M9902 Segmental and somatic dysfunction of thoracic region: Secondary | ICD-10-CM | POA: Diagnosis not present

## 2019-08-09 DIAGNOSIS — M4604 Spinal enthesopathy, thoracic region: Secondary | ICD-10-CM | POA: Diagnosis not present

## 2019-08-09 DIAGNOSIS — M47892 Other spondylosis, cervical region: Secondary | ICD-10-CM | POA: Diagnosis not present

## 2019-08-09 DIAGNOSIS — M9901 Segmental and somatic dysfunction of cervical region: Secondary | ICD-10-CM | POA: Diagnosis not present

## 2019-08-11 DIAGNOSIS — M1712 Unilateral primary osteoarthritis, left knee: Secondary | ICD-10-CM | POA: Diagnosis not present

## 2019-08-19 ENCOUNTER — Other Ambulatory Visit: Payer: Self-pay | Admitting: Family Medicine

## 2019-08-19 MED ORDER — TRIAMTERENE 37.5 MG-HYDROCHLOROTHIAZIDE 25 MG TABLET
Freq: Every day | ORAL | 0 days
Start: 2019-08-19 — End: ?

## 2019-08-19 NOTE — Telephone Encounter (Signed)
Pharmacy requesting refills.

## 2019-08-23 DIAGNOSIS — M47892 Other spondylosis, cervical region: Secondary | ICD-10-CM | POA: Diagnosis not present

## 2019-08-23 DIAGNOSIS — M4604 Spinal enthesopathy, thoracic region: Secondary | ICD-10-CM | POA: Diagnosis not present

## 2019-08-23 DIAGNOSIS — M9901 Segmental and somatic dysfunction of cervical region: Secondary | ICD-10-CM | POA: Diagnosis not present

## 2019-08-23 DIAGNOSIS — M9902 Segmental and somatic dysfunction of thoracic region: Secondary | ICD-10-CM | POA: Diagnosis not present

## 2019-08-30 DIAGNOSIS — M9902 Segmental and somatic dysfunction of thoracic region: Secondary | ICD-10-CM | POA: Diagnosis not present

## 2019-08-30 DIAGNOSIS — M47892 Other spondylosis, cervical region: Secondary | ICD-10-CM | POA: Diagnosis not present

## 2019-08-30 DIAGNOSIS — M9901 Segmental and somatic dysfunction of cervical region: Secondary | ICD-10-CM | POA: Diagnosis not present

## 2019-08-30 DIAGNOSIS — M4604 Spinal enthesopathy, thoracic region: Secondary | ICD-10-CM | POA: Diagnosis not present

## 2019-09-03 DIAGNOSIS — I639 Cerebral infarction, unspecified: Secondary | ICD-10-CM

## 2019-09-03 HISTORY — DX: Cerebral infarction, unspecified: I63.9

## 2019-09-04 ENCOUNTER — Other Ambulatory Visit: Payer: Self-pay

## 2019-09-04 ENCOUNTER — Inpatient Hospital Stay
Admission: EM | Admit: 2019-09-04 | Discharge: 2019-09-05 | DRG: 065 | Disposition: A | Discharge status: Home Health | Payer: Medicare PPO | Attending: Internal Medicine | Admitting: Internal Medicine

## 2019-09-04 ENCOUNTER — Encounter: Payer: Self-pay | Admitting: Emergency Medicine

## 2019-09-04 ENCOUNTER — Emergency Department: Payer: Medicare PPO

## 2019-09-04 DIAGNOSIS — F329 Major depressive disorder, single episode, unspecified: Secondary | ICD-10-CM | POA: Diagnosis present

## 2019-09-04 DIAGNOSIS — Z79890 Hormone replacement therapy (postmenopausal): Secondary | ICD-10-CM | POA: Diagnosis not present

## 2019-09-04 DIAGNOSIS — E039 Hypothyroidism, unspecified: Secondary | ICD-10-CM | POA: Diagnosis present

## 2019-09-04 DIAGNOSIS — I639 Cerebral infarction, unspecified: Principal | ICD-10-CM | POA: Diagnosis present

## 2019-09-04 DIAGNOSIS — F419 Anxiety disorder, unspecified: Secondary | ICD-10-CM | POA: Diagnosis present

## 2019-09-04 DIAGNOSIS — Z20828 Contact with and (suspected) exposure to other viral communicable diseases: Secondary | ICD-10-CM | POA: Diagnosis present

## 2019-09-04 DIAGNOSIS — I10 Essential (primary) hypertension: Secondary | ICD-10-CM | POA: Diagnosis not present

## 2019-09-04 DIAGNOSIS — E876 Hypokalemia: Secondary | ICD-10-CM | POA: Diagnosis present

## 2019-09-04 DIAGNOSIS — M069 Rheumatoid arthritis, unspecified: Secondary | ICD-10-CM | POA: Diagnosis present

## 2019-09-04 DIAGNOSIS — R29702 NIHSS score 2: Secondary | ICD-10-CM | POA: Diagnosis present

## 2019-09-04 DIAGNOSIS — Z880 Allergy status to penicillin: Secondary | ICD-10-CM

## 2019-09-04 DIAGNOSIS — R531 Weakness: Secondary | ICD-10-CM | POA: Diagnosis not present

## 2019-09-04 DIAGNOSIS — Z91013 Allergy to seafood: Secondary | ICD-10-CM | POA: Diagnosis not present

## 2019-09-04 DIAGNOSIS — Z87891 Personal history of nicotine dependence: Secondary | ICD-10-CM | POA: Diagnosis not present

## 2019-09-04 DIAGNOSIS — G8191 Hemiplegia, unspecified affecting right dominant side: Secondary | ICD-10-CM | POA: Diagnosis present

## 2019-09-04 DIAGNOSIS — I6389 Other cerebral infarction: Secondary | ICD-10-CM | POA: Diagnosis not present

## 2019-09-04 DIAGNOSIS — Z886 Allergy status to analgesic agent status: Secondary | ICD-10-CM | POA: Diagnosis not present

## 2019-09-04 DIAGNOSIS — Z86718 Personal history of other venous thrombosis and embolism: Secondary | ICD-10-CM | POA: Diagnosis not present

## 2019-09-04 DIAGNOSIS — E785 Hyperlipidemia, unspecified: Secondary | ICD-10-CM | POA: Diagnosis not present

## 2019-09-04 DIAGNOSIS — Z7952 Long term (current) use of systemic steroids: Secondary | ICD-10-CM | POA: Diagnosis not present

## 2019-09-04 DIAGNOSIS — Z885 Allergy status to narcotic agent status: Secondary | ICD-10-CM | POA: Diagnosis not present

## 2019-09-04 DIAGNOSIS — Z79899 Other long term (current) drug therapy: Secondary | ICD-10-CM | POA: Diagnosis not present

## 2019-09-04 DIAGNOSIS — N179 Acute kidney failure, unspecified: Secondary | ICD-10-CM | POA: Diagnosis present

## 2019-09-04 DIAGNOSIS — K219 Gastro-esophageal reflux disease without esophagitis: Secondary | ICD-10-CM | POA: Diagnosis not present

## 2019-09-04 LAB — PROTIME-INR
INR: 0.9 (ref 0.8–1.2)
Prothrombin Time: 12.5 seconds (ref 11.4–15.2)

## 2019-09-04 LAB — COMPREHENSIVE METABOLIC PANEL
ALT: 22 U/L (ref 0–44)
AST: 22 U/L (ref 15–41)
Albumin: 3.7 g/dL (ref 3.5–5.0)
Alkaline Phosphatase: 86 U/L (ref 38–126)
Anion gap: 10 (ref 5–15)
BUN: 17 mg/dL (ref 8–23)
CO2: 24 mmol/L (ref 22–32)
Calcium: 9.2 mg/dL (ref 8.9–10.3)
Chloride: 108 mmol/L (ref 98–111)
Creatinine, Ser: 1.09 mg/dL — ABNORMAL HIGH (ref 0.44–1.00)
GFR calc Af Amer: >60 mL/min (ref >60)
GFR calc non Af Amer: 53 mL/min — ABNORMAL LOW (ref >60)
Glucose, Bld: 113 mg/dL — ABNORMAL HIGH (ref 70–99)
Potassium: 3.2 mmol/L — ABNORMAL LOW (ref 3.5–5.1)
Sodium: 142 mmol/L (ref 135–145)
Total Bilirubin: 0.7 mg/dL (ref 0.3–1.2)
Total Protein: 6.3 g/dL — ABNORMAL LOW (ref 6.5–8.1)

## 2019-09-04 LAB — DIFFERENTIAL
Abs Immature Granulocytes: 0.02 10*3/uL (ref 0.00–0.07)
Basophils Absolute: 0.1 10*3/uL (ref 0.0–0.1)
Basophils Relative: 1 %
Eosinophils Absolute: 0.2 10*3/uL (ref 0.0–0.5)
Eosinophils Relative: 2 %
Immature Granulocytes: 0 %
Lymphocytes Relative: 24 %
Lymphs Abs: 1.8 10*3/uL (ref 0.7–4.0)
Monocytes Absolute: 0.6 10*3/uL (ref 0.1–1.0)
Monocytes Relative: 8 %
Neutro Abs: 5.1 10*3/uL (ref 1.7–7.7)
Neutrophils Relative %: 65 %

## 2019-09-04 LAB — CBC
HCT: 41.8 % (ref 36.0–46.0)
Hemoglobin: 13.2 g/dL (ref 12.0–15.0)
MCH: 26.6 pg (ref 26.0–34.0)
MCHC: 31.6 g/dL (ref 30.0–36.0)
MCV: 84.3 fL (ref 80.0–100.0)
Platelets: 245 10*3/uL (ref 150–400)
RBC: 4.96 MIL/uL (ref 3.87–5.11)
RDW: 13.3 % (ref 11.5–15.5)
WBC: 7.7 10*3/uL (ref 4.0–10.5)
nRBC: 0 % (ref 0.0–0.2)

## 2019-09-04 LAB — APTT: aPTT: 28 seconds (ref 24–36)

## 2019-09-04 LAB — FIBRIN DERIVATIVES D-DIMER (ARMC ONLY): Fibrin derivatives D-dimer (ARMC): 1200.41 ng/mL (FEU) — ABNORMAL HIGH (ref 0.00–499.00)

## 2019-09-04 MED ORDER — LEVOTHYROXINE SODIUM 50 MCG PO TABS
75.0000 ug | ORAL_TABLET | Freq: Every day | ORAL | Status: DC
  Administered 2019-09-05: 06:00:00 75 ug via ORAL
  Filled 2019-09-04: qty 1

## 2019-09-04 MED ORDER — DULOXETINE HCL 30 MG PO CPEP
30.0000 mg | ORAL_CAPSULE | Freq: Every day | ORAL | Status: DC
  Administered 2019-09-05: 09:00:00 30 mg via ORAL
  Filled 2019-09-04: qty 1

## 2019-09-04 MED ORDER — VITAMIN D3 25 MCG (1000 UNIT) PO TABS
1000.0000 [IU] | ORAL_TABLET | Freq: Every day | ORAL | Status: DC
  Administered 2019-09-05: 09:00:00 1000 [IU] via ORAL
  Filled 2019-09-04 (×2): qty 1

## 2019-09-04 MED ORDER — SENNOSIDES-DOCUSATE SODIUM 8.6-50 MG PO TABS: 1.0000 | ORAL_TABLET | Freq: Every evening | ORAL | Status: DC | PRN

## 2019-09-04 MED ORDER — PANTOPRAZOLE SODIUM 40 MG PO TBEC
40.0000 mg | DELAYED_RELEASE_TABLET | Freq: Every day | ORAL | Status: DC
  Administered 2019-09-05: 40 mg via ORAL
  Filled 2019-09-04: qty 1

## 2019-09-04 MED ORDER — SODIUM CHLORIDE 0.9 % IV SOLN
INTRAVENOUS | Status: DC
  Administered 2019-09-04: 20:00:00 via INTRAVENOUS

## 2019-09-04 MED ORDER — STROKE: EARLY STAGES OF RECOVERY BOOK
Freq: Once | Status: AC
  Administered 2019-09-04: 20:00:00

## 2019-09-04 MED ORDER — TRIAMTERENE-HCTZ 37.5-25 MG PO TABS
1.0000 | ORAL_TABLET | Freq: Every day | ORAL | Status: DC
  Administered 2019-09-05: 09:00:00 1 via ORAL
  Filled 2019-09-04: qty 1

## 2019-09-04 MED ORDER — PREDNISONE 10 MG PO TABS
5.0000 mg | ORAL_TABLET | Freq: Every day | ORAL | Status: DC
  Administered 2019-09-05: 5 mg via ORAL
  Filled 2019-09-04: qty 1

## 2019-09-04 MED ORDER — ACETAMINOPHEN 650 MG RE SUPP: 650.0000 mg | RECTAL | Status: DC | PRN

## 2019-09-04 MED ORDER — ENOXAPARIN SODIUM 40 MG/0.4ML ~~LOC~~ SOLN
40.0000 mg | SUBCUTANEOUS | Status: DC
  Administered 2019-09-04: 21:00:00 40 mg via SUBCUTANEOUS
  Filled 2019-09-04: qty 0.4

## 2019-09-04 MED ORDER — ACETAMINOPHEN 325 MG PO TABS: 650.0000 mg | ORAL_TABLET | ORAL | Status: DC | PRN

## 2019-09-04 MED ORDER — CLOPIDOGREL BISULFATE 75 MG PO TABS
75.0000 mg | ORAL_TABLET | Freq: Once | ORAL | Status: AC
  Administered 2019-09-04: 75 mg via ORAL
  Filled 2019-09-04: qty 1

## 2019-09-04 MED ORDER — POTASSIUM CHLORIDE CRYS ER 20 MEQ PO TBCR
40.0000 meq | EXTENDED_RELEASE_TABLET | Freq: Once | ORAL | Status: AC
  Administered 2019-09-04: 21:00:00 40 meq via ORAL
  Filled 2019-09-04: qty 2

## 2019-09-04 MED ORDER — SODIUM CHLORIDE 0.9% FLUSH: 3.0000 mL | Freq: Once | INTRAVENOUS | Status: DC

## 2019-09-04 MED ORDER — ACETAMINOPHEN 160 MG/5ML PO SOLN
650.0000 mg | ORAL | Status: DC | PRN
  Filled 2019-09-04: qty 20.3

## 2019-09-04 NOTE — ED Notes (Signed)
ED TO INPATIENT HANDOFF REPORT  ED Nurse Name and Phone #: Mac [phone]  S Name/Age/Gender Angela Sherman 68 y.o. female Room/Bed: ED18HA/ED18HA  Code Status   Code Status: Full Code  Home/SNF/Other Home Patient oriented to: self, place, time and situation Is this baseline? Yes   Triage Complete: Triage complete  Chief Complaint poss stroke`  Triage Note Pt to ED via POV for "pins and needles" right hand and weakness in her right leg. Pt states that she first noticed symptoms around 1000 or 1100 yesterday. Last known well between 0930-1030 yesterday. Pt has difficulty ambulating in triage. No facial droop noted at this time, no slurred speech. Pt is in NAD.    Allergies Allergies  Allergen Reactions  . Remicade [Infliximab] Anaphylaxis  . Shellfish-Derived Products Rash and Swelling  . Ciprofloxacin Other (See Comments)    Unknown  . Demerol [Meperidine] Nausea And Vomiting  . Nsaids Other (See Comments)    Unknown  . Tolmetin Other (See Comments)    Unknown  . Aspirin Rash  . Clinoril [Sulindac] Rash  . Codeine Nausea And Vomiting and Other (See Comments)    Severe vomiting  . Methotrexate Nausea And Vomiting and Rash  . Methotrexate Derivatives Rash and Cough  . Penicillins Rash and Other (See Comments)    Has patient had a PCN reaction causing immediate rash, facial/tongue/throat swelling, SOB or lightheadedness with hypotension: Unknown Has patient had a PCN reaction causing severe rash involving mucus membranes or skin necrosis: No Has patient had a PCN reaction that required hospitalization: No Has patient had a PCN reaction occurring within the last 10 years: No If all of the above answers are "NO", then may proceed with Cephalosporin use.   . Shellfish Allergy Swelling and Rash    Level of Care/Admitting Diagnosis ED Disposition    ED Disposition Condition [facility]: [place] [100120]  Level of Care: Med-Surg  [16]  Covid Evaluation: Asymptomatic Screening Protocol (No Symptoms)  Diagnosis: CVA (cerebral vascular accident) [facility]) [[id number]]  Admitting Physician: Eula Flax  Attending Physician: Rufina Falco ACHIENG [phone]  Estimated length of stay: past midnight tomorrow  Certification:: I certify this patient will need inpatient services for at least 2 midnights  PT Class (Do Not Modify): Inpatient [101]  PT Acc Code (Do Not Modify): Private [1]       B Medical/Surgery History Past Medical History:  Diagnosis Date  . Arthritis    RA  . Cough    CHRONIC  . Depression   . GERD (gastroesophageal reflux disease)   . Headache    H/O MIGRAINES  . History of deep vein thrombosis    Right calf  . History of peritonitis 2002?   Right calf.  . Hypertension   . Hypothyroidism   . PONV (postoperative nausea and vomiting)    NO PROBLEM WITH CATARACT SURGERY  . Thyroid disease    Past Surgical History:  Procedure Laterality Date  . ABDOMINAL EXPLORATION SURGERY    . APPENDECTOMY  2001   Laparoscopy, Dx-Peritontis  . CATARACT EXTRACTION W/PHACO Right 09/03/2018   Procedure: CATARACT EXTRACTION PHACO AND INTRAOCULAR LENS PLACEMENT (IOC);  Surgeon: Birder Robson, MD;  Location: [facility] ORS;  Service: Ophthalmology;  Laterality: Right;  [name] 00:31.5  AP% 14.9 CDE 4.70 Fluid pack Lot # [id number]  . CATARACT EXTRACTION W/PHACO Left 10/20/2018   Procedure: CATARACT EXTRACTION PHACO AND INTRAOCULAR LENS PLACEMENT ([place]);  Surgeon: Birder Robson, MD;  Location: [facility] ORS;  Service: Ophthalmology;  Laterality: Left;  [name] 00:23 CDE 3.50 Fluid pack lot # [id number]  . CHOLECYSTECTOMY N/A 06/04/2016   Procedure: LAPAROSCOPIC CHOLECYSTECTOMY WITH INTRAOPERATIVE CHOLANGIOGRAM;  Surgeon: Robert Bellow, MD;  Location: [facility] ORS;  Service: General;  Laterality: N/A;  . CHOLECYSTECTOMY  06/04/2016   Procedure: CHOLECYSTECTOMY;  Surgeon: Robert Bellow, MD;  Location: [facility] ORS;   Service: General;;  Laparoscopic converted to open  . DIAGNOSTIC LAPAROSCOPY    . HAND SURGERY Left   . KNEE SURGERY Right   . Reconstruction of the right hand  2006   surgery  . WRIST SURGERY Left 1989   Dx-RA     A IV Location/Drains/Wounds Patient Lines/Drains/Airways Status   Active Line/Drains/Airways    Name:   Placement date:   Placement time:   Site:   Days:   Peripheral IV 09/04/19 Right Antecubital   09/04/19    1533    Antecubital   less than 1   Airway   09/03/18    0752     366   Incision (Closed) 09/03/18 Eye Right   09/03/18    0734     366   Incision (Closed) 10/20/18 Eye Left   10/20/18    1005     319   Incision - 3 Ports Abdomen 1: Upper;Right 2: Mid 3: Umbilicus   44/31/54    0086     1187          Intake/Output Last 24 hours No intake or output data in the 24 hours ending 09/04/19 1553  Labs/Imaging Results for orders placed or performed during the hospital encounter of 09/04/19 (from the past 48 hour(s))  Protime-INR     Status: None   Collection Time: 09/04/19 11:13 AM  Result Value Ref Range   Prothrombin Time 12.5 11.4 - 15.2 seconds   INR 0.9 0.8 - 1.2    Comment: (NOTE) INR goal varies based on device and disease states. Performed at [facility], [address] [place], [place] [address]   APTT     Status: None   Collection Time: 09/04/19 11:13 AM  Result Value Ref Range   aPTT 28 24 - 36 seconds    Comment: Performed at [facility], [address] [place], [place] [address]  CBC     Status: None   Collection Time: 09/04/19 11:13 AM  Result Value Ref Range   WBC 7.7 4.0 - 10.5 K/uL   RBC 4.96 3.87 - 5.11 MIL/uL   Hemoglobin 13.2 12.0 - 15.0 g/dL   HCT 41.8 36.0 - 46.0 %   MCV 84.3 80.0 - 100.0 fL   MCH 26.6 26.0 - 34.0 pg   MCHC 31.6 30.0 - 36.0 g/dL   RDW 13.3 11.5 - 15.5 %   Platelets 245 150 - 400 K/uL   nRBC 0.0 0.0 - 0.2 %    Comment: Performed at [facility], [address] [place],  [place] [address]  Differential     Status: None   Collection Time: 09/04/19 11:13 AM  Result Value Ref Range   Neutrophils Relative % 65 %   Neutro Abs 5.1 1.7 - 7.7 K/uL   Lymphocytes Relative 24 %   Lymphs Abs 1.8 0.7 - 4.0 K/uL   Monocytes Relative 8 %   Monocytes Absolute 0.6 0.1 - 1.0 K/uL   Eosinophils Relative 2 %   Eosinophils Absolute 0.2 0.0 - 0.5 K/uL   Basophils  Relative 1 %   Basophils Absolute 0.1 0.0 - 0.1 K/uL   Immature Granulocytes 0 %   Abs Immature Granulocytes 0.02 0.00 - 0.07 K/uL    Comment: Performed at [facility], [address] [place], [place] [address]  Comprehensive metabolic panel     Status: Abnormal   Collection Time: 09/04/19 11:13 AM  Result Value Ref Range   Sodium 142 135 - 145 mmol/L   Potassium 3.2 (L) 3.5 - 5.1 mmol/L   Chloride 108 98 - 111 mmol/L   CO2 24 22 - 32 mmol/L   Glucose, Bld 113 (H) 70 - 99 mg/dL   BUN 17 8 - 23 mg/dL   Creatinine, Ser 1.09 (H) 0.44 - 1.00 mg/dL   Calcium 9.2 8.9 - 10.3 mg/dL   Total Protein 6.3 (L) 6.5 - 8.1 g/dL   Albumin 3.7 3.5 - 5.0 g/dL   AST 22 15 - 41 U/L   ALT 22 0 - 44 U/L   Alkaline Phosphatase 86 38 - 126 U/L   Total Bilirubin 0.7 0.3 - 1.2 mg/dL   GFR calc non Af Amer 53 (L) >60 mL/min   GFR calc Af Amer >60 >60 mL/min   Anion gap 10 5 - 15    Comment: Performed at [facility], [address], [place], [place] [address]  Fibrin derivatives D-Dimer [facility] only)     Status: Abnormal   Collection Time: 09/04/19 11:13 AM  Result Value Ref Range   Fibrin derivatives D-dimer ([facility]) 1,200.41 (H) 0.00 - 499.00 ng/mL (FEU)    Comment: (NOTE) <> Exclusion of Venous Thromboembolism (VTE) - OUTPATIENT ONLY   (Emergency Department or [place])   0-499 ng/ml (FEU): With a low to intermediate pretest probability                      for VTE this test result excludes the diagnosis                      of VTE.   >499 ng/ml (FEU) : VTE not excluded; additional work up for VTE is                       required. <> Testing on Inpatients and Evaluation of Disseminated Intravascular   Coagulation (DIC) Reference Range:   0-499 ng/ml (FEU) Performed at [facility], [address] [place], [place] [address]    Ct Head Wo Contrast  Result Date: 09/04/2019 CLINICAL DATA:  Pt to ED via POV for "pins and needles" right hand and weakness in her right leg. Pt states that she first noticed symptoms around 1000 or 1100 yesterday. Last known well between 0930-1030 yesterday. EXAM: CT HEAD WITHOUT CONTRAST TECHNIQUE: Contiguous axial images were obtained from the base of the skull through the vertex without intravenous contrast. COMPARISON:  None. FINDINGS: Brain: No evidence of acute infarction, hemorrhage, hydrocephalus, extra-axial collection or mass lesion/mass effect. Vascular: No hyperdense vessel or unexpected calcification. Skull: Normal. Negative for fracture or focal lesion. Sinuses/Orbits: No acute finding. Other: None. IMPRESSION: No acute intracranial process. Electronically Signed   By: Audie Pinto M.D.   On: 09/04/2019 12:04    Pending Labs Unresulted Labs (From admission, onward)    Start     Ordered   09/05/19 0500  Hemoglobin A1c  Tomorrow morning,   STAT     09/04/19 1523   09/05/19 0500  Lipid panel  Tomorrow morning,  STAT    Comments: Fasting    09/04/19 1523   09/04/19 1518  HIV antibody (Routine Testing)  Once,   STAT     09/04/19 1523          Vitals/Pain Today's Vitals   09/04/19 1059 09/04/19 1105 09/04/19 1435 09/04/19 1538  BP:   (!) 145/107 (!) 148/105  Pulse:   (!) 102 95  Resp:   19 18  Temp:   98.5 F (36.9 C)   TempSrc:   Oral   SpO2:   100% 99%  Weight: 90.7 kg 90.7 kg    Height: _0  (1.676 m) _1  (1.676 m)    PainSc:  0-No pain      Isolation Precautions No active isolations  Medications Medications  triamterene-hydrochlorothiazide (MAXZIDE-25) 37.5-25 MG per tablet 1 tablet (has no administration in time range)   DULoxetine (CYMBALTA) DR capsule 30 mg (has no administration in time range)  levothyroxine (SYNTHROID) tablet 75 mcg (has no administration in time range)  predniSONE (DELTASONE) tablet 5 mg (has no administration in time range)  pantoprazole (PROTONIX) EC tablet 40 mg (has no administration in time range)  cholecalciferol (VITAMIN D) tablet 1,000 Units (has no administration in time range)   stroke: mapping our early stages of recovery book (has no administration in time range)  0.9 %  sodium chloride infusion (has no administration in time range)  acetaminophen (TYLENOL) tablet 650 mg (has no administration in time range)    Or  acetaminophen (TYLENOL) solution 650 mg (has no administration in time range)    Or  acetaminophen (TYLENOL) suppository 650 mg (has no administration in time range)  senna-docusate (Senokot-S) tablet 1 tablet (has no administration in time range)  enoxaparin (LOVENOX) injection 40 mg (has no administration in time range)  clopidogrel (PLAVIX) tablet 75 mg (has no administration in time range)    Mobility walks Low fall risk   Focused Assessments    R Recommendations: See Admitting Provider Note  Report given to:   Additional Notes:

## 2019-09-04 NOTE — H&P (Addendum)
[employer] - [place] [facility]   PATIENT NAME: Angela Sherman    MR#:  [id number]  DATE OF BIRTH:  [birth year]  DATE OF ADMISSION:  09/04/2019  PRIMARY CARE PHYSICIAN: Maple HudsonGilbert, Richard L Jr., MD   REQUESTING/REFERRING PHYSICIAN: Dorothea GlassmanMalinda, Paul, MD  CHIEF COMPLAINT:   Chief Complaint  Patient presents with   Weakness   HISTORY OF PRESENT ILLNESS:   68 year old female with past medical history of depression and anxiety, GERD, DVT, peritonitis, hypertension, hypothyroidism, and rheumatoid arthritis presenting to the ED with chief complaints of numbness and tingling in her right hand and right leg, and right leg weakness.  Patient report onset of symptoms between 10:50 AM yesterday, last known well between 9 30-10 30 yesterday.  Patient states she got up to walk and suddenly felt very dizzy with right leg weakness. Patient states she almost fell so she decided to sit down. She developed numbness and tingling of the right side worse in her right hand without other associated symptoms of altered sensorium, speech abnormality, cranial nerve deficit, seizures, diplopia, nausea or vomiting, syncope or LOC. Patient state her symptoms has not resolved by morning and she was dragging her right feet so she decided to come to the ED for further evaluation,  On arrival to the ED, he was afebrile with blood pressure 145/107 mm Hg and pulse rate 102 beats/min. There were no focal neurological deficits; she was alert and oriented x4, and he did not demonstrate any memory deficits.  NIH stroke scale 2. Patient was not a candidate for IV alteplase due to being outside of the window period.  Initial labs revealed potassium of 3.2, creatinine 1.09, otherwise unremarkable CBC and CMP.  Noncontrast CT head was obtained and did not show any acute intracranial abnormality.  Due to persistent symptoms patient will be admitted for further stroke work-up and management.  PAST MEDICAL HISTORY:    Past Medical History:  Diagnosis Date   Arthritis    RA   Cough    CHRONIC   Depression    GERD (gastroesophageal reflux disease)    Headache    H/O MIGRAINES   History of deep vein thrombosis    Right calf   History of peritonitis 2002?   Right calf.   Hypertension    Hypothyroidism    PONV (postoperative nausea and vomiting)    NO PROBLEM WITH CATARACT SURGERY   Thyroid disease     PAST SURGICAL HISTORY:   Past Surgical History:  Procedure Laterality Date   ABDOMINAL EXPLORATION SURGERY     APPENDECTOMY  2001   Laparoscopy, Dx-Peritontis   CATARACT EXTRACTION W/PHACO Right 09/03/2018   Procedure: CATARACT EXTRACTION PHACO AND INTRAOCULAR LENS PLACEMENT (IOC);  Surgeon: Galen ManilaPorfilio, William, MD;  Location: [facility] ORS;  Service: Ophthalmology;  Laterality: Right;  US 00:31.5  AP% 14.9 CDE 4.70 Fluid pack Lot # [id number]   CATARACT EXTRACTION W/PHACO Left 10/20/2018   Procedure: CATARACT EXTRACTION PHACO AND INTRAOCULAR LENS PLACEMENT (IOC);  Surgeon: Galen ManilaPorfilio, William, MD;  Location: [facility] ORS;  Service: Ophthalmology;  Laterality: Left;  US 00:23 CDE 3.50 Fluid pack lot # [id number]   CHOLECYSTECTOMY N/A 06/04/2016   Procedure: LAPAROSCOPIC CHOLECYSTECTOMY WITH INTRAOPERATIVE CHOLANGIOGRAM;  Surgeon: Earline MayotteJeffrey W Byrnett, MD;  Location: [facility] ORS;  Service: General;  Laterality: N/A;   CHOLECYSTECTOMY  06/04/2016   Procedure: CHOLECYSTECTOMY;  Surgeon: Earline MayotteJeffrey W Byrnett, MD;  Location: [facility] ORS;  Service: General;;  Laparoscopic converted to open   DIAGNOSTIC LAPAROSCOPY  HAND SURGERY Left    KNEE SURGERY Right    Reconstruction of the right hand  2006   surgery   WRIST SURGERY Left 1989   Dx-RA    SOCIAL HISTORY:   Social History   Tobacco Use   Smoking status: Former Smoker    Types: Cigarettes   Smokeless tobacco: Never Used  Substance Use Topics   Alcohol use: Yes    Comment: Occasional, maybe 1-2 a month    FAMILY HISTORY:   Family  History  Problem Relation Age of Onset   Hypertension Brother     DRUG ALLERGIES:   Allergies  Allergen Reactions   Remicade [Infliximab] Anaphylaxis   Shellfish-Derived Products Rash and Swelling   Ciprofloxacin Other (See Comments)    Unknown   Demerol [Meperidine] Nausea And Vomiting   Nsaids Other (See Comments)    Unknown   Tolmetin Other (See Comments)    Unknown   Aspirin Rash   Clinoril [Sulindac] Rash   Codeine Nausea And Vomiting and Other (See Comments)    Severe vomiting   Methotrexate Nausea And Vomiting and Rash   Methotrexate Derivatives Rash and Cough   Penicillins Rash and Other (See Comments)    Has patient had a PCN reaction causing immediate rash, facial/tongue/throat swelling, SOB or lightheadedness with hypotension: Unknown Has patient had a PCN reaction causing severe rash involving mucus membranes or skin necrosis: No Has patient had a PCN reaction that required hospitalization: No Has patient had a PCN reaction occurring within the last 10 years: No If all of the above answers are "NO", then may proceed with Cephalosporin use.    Shellfish Allergy Swelling and Rash    REVIEW OF SYSTEMS:   Review of Systems  Constitutional: Negative for chills, fever, malaise/fatigue and weight loss.  HENT: Negative for congestion, hearing loss and sore throat.   Eyes: Negative for blurred vision and double vision.  Respiratory: Negative for cough, shortness of breath and wheezing.   Cardiovascular: Negative for chest pain, palpitations, orthopnea and leg swelling.  Gastrointestinal: Negative for abdominal pain, diarrhea, nausea and vomiting.  Genitourinary: Negative for dysuria and urgency.  Musculoskeletal: Positive for joint pain and neck pain. Negative for myalgias.  Skin: Negative for rash.  Neurological: Positive for dizziness, sensory change and focal weakness. Negative for speech change and headaches.  Psychiatric/Behavioral: Negative for  depression.   MEDICATIONS AT HOME:   Prior to Admission medications   Medication Sig Start Date End Date Taking? Authorizing Provider  cholecalciferol (VITAMIN D) 1000 units tablet Take 1,000 Units by mouth daily.    Yes [provider]  DULoxetine (CYMBALTA) 30 MG capsule TAKE 1 CAPSULE(30 MG) BY MOUTH DAILY 08/10/19  Yes Maple Hudson., MD  levothyroxine (SYNTHROID) 75 MCG tablet TAKE 1 TABLET EVERY DAY BEFORE BREAKFAST 08/19/19  Yes Maple Hudson., MD  omeprazole (PRILOSEC) 20 MG capsule TAKE 1 CAPSULE(20 MG) BY MOUTH DAILY 03/25/19  Yes Maple Hudson., MD  predniSONE (DELTASONE) 5 MG tablet Take 5-10 mg by mouth daily with breakfast.    Yes [provider]  triamterene-hydrochlorothiazide (MAXZIDE-25) 37.5-25 MG tablet TAKE 1 TABLET EVERY DAY 08/19/19  Yes Maple Hudson., MD      VITAL SIGNS:  Blood pressure (!) 148/105, pulse 95, temperature 98.5 F (36.9 C), temperature source Oral, resp. rate 18, height 5\' 6"  (1.676 m), weight 90.7 kg, SpO2 99 %.  PHYSICAL EXAMINATION:   Physical Exam  GENERAL:  68 y.o.-year-old  patient lying in the bed with no acute distress.  EYES: Pupils equal, round, reactive to light and accommodation. No scleral icterus. Extraocular muscles intact.  HEENT: Head atraumatic, normocephalic. Oropharynx and nasopharynx clear.  NECK:  Supple, no jugular venous distention. No thyroid enlargement, no tenderness.  LUNGS: Normal breath sounds bilaterally, no wheezing, rales,rhonchi or crepitation. No use of accessory muscles of respiration.  CARDIOVASCULAR: S1, S2 normal. No murmurs, rubs, or gallops.  ABDOMEN: Soft, nontender, nondistended. Bowel sounds present. No organomegaly or mass.  EXTREMITIES: No pedal edema, cyanosis, or clubbing. No rash or lesions. + pedal pulses MUSCULOSKELETAL: Normal bulk, and power was 5+ grip and elbow, knee, and ankle flexion and extension bilaterally.  NEUROLOGIC:Alert and oriented x  3. CN 2-12 intact. Sensation to light touch creased on the right.   Dysmetric on the right.  Babinski is downgoing. DTR's (biceps, patellar, and achilles) 2+ and symmetric throughout. Gait not tested due to safety concern. PSYCHIATRIC: The patient is alert and oriented x 3.  SKIN: No obvious rash, lesion, or ulcer.   DATA REVIEWED:  LABORATORY PANEL:   CBC Recent Labs  Lab 09/04/19 1113  WBC 7.7  HGB 13.2  HCT 41.8  PLT 245   ------------------------------------------------------------------------------------------------------------------  Chemistries  Recent Labs  Lab 09/04/19 1113  NA 142  K 3.2*  CL 108  CO2 24  GLUCOSE 113*  BUN 17  CREATININE 1.09*  CALCIUM 9.2  AST 22  ALT 22  ALKPHOS 86  BILITOT 0.7   ------------------------------------------------------------------------------------------------------------------  Cardiac Enzymes No results for input(s): TROPONINI in the last 168 hours. ------------------------------------------------------------------------------------------------------------------  RADIOLOGY:  Ct Head Wo Contrast  Result Date: 09/04/2019 CLINICAL DATA:  Pt to ED via POV for "pins and needles" right hand and weakness in her right leg. Pt states that she first noticed symptoms around 1000 or 1100 yesterday. Last known well between 0930-1030 yesterday. EXAM: CT HEAD WITHOUT CONTRAST TECHNIQUE: Contiguous axial images were obtained from the base of the skull through the vertex without intravenous contrast. COMPARISON:  None. FINDINGS: Brain: No evidence of acute infarction, hemorrhage, hydrocephalus, extra-axial collection or mass lesion/mass effect. Vascular: No hyperdense vessel or unexpected calcification. Skull: Normal. Negative for fracture or focal lesion. Sinuses/Orbits: No acute finding. Other: None. IMPRESSION: No acute intracranial process. Electronically Signed   By: Audie Pinto M.D.   On: 09/04/2019 12:04    EKG:  EKG: unchanged  from previous tracings, sinus tachycardia. Vent. rate 106 BPM PR interval 212 ms QRS duration 82 ms QT/QTc 336/446 ms P-R-T axes 42 16 26 IMPRESSION AND PLAN:   68 y.o. female with past medical history of depression and anxiety, GERD, DVT, peritonitis, hypertension, hypothyroidism, and rheumatoid arthritis presenting to the ED with chief complaints of numbness and tingling in her right hand and right leg, and right leg weakness  1. Right sided weakness and numbness - Concerns for ischemic event  - Admit to medsurg unit - CT head reviewed and shows no acute intracranial abnormality  - Check HgbA1c, fasting lipid panel - Obtain MRI brain and MR angio neck - PT consult, OT consult, Speech consult - Echocardiogram - US carotids - Patient was not on any antiplatelet or anticoagulation prior to this event.  Will start prophylactic therapy- Plavix 75mg  PO daily. She is allergic to Aspirin - NPO until RN stroke swallow screen -Telemetry monitoring - Frequent neuro checks - Neurology consult placed. Message sent via Haiku to Dr.   2. Hypokalemia - Replete and recheck  3. Mild AKI -  Hold nephrotoxins - Monitor renal functions  4. Hypertension -Continue triamterene hydrochlorothiazide  5. Hypothyroidism -continue Synthroid  6. Depression-continue Cymbalta  7. GERD continue Protonix  DVT prophylaxis - Enoxaparin SubQ    All the records are reviewed and case discussed with ED provider. Management plans discussed with the patient, family and they are in agreement.  CODE STATUS: FULL  TOTAL TIME TAKING CARE OF THIS PATIENT: 50 minutes.    on 09/04/2019 at 3:39 PM  Webb SilversmithElizabeth Maekayla Giorgio, DNP, FNP-BC [facility] Nurse Practitioner Between 7am to 6pm - Pager [phone]- [phone]  After 6pm go to [email or web address] - Social research officer, governmentpassword [email or web address] [email or web address]  [facility] [place] [facility]  Office  [phone]  CC: Primary care physician; Maple HudsonGilbert, Richard L Jr., MD

## 2019-09-04 NOTE — ED Provider Notes (Addendum)
[facility] Emergency Department Provider Note   ____________________________________________   First MD Initiated Contact with Patient 09/04/19 1416     (approximate)  I have reviewed the triage vital signs and the nursing notes.   HISTORY  Chief Complaint Weakness    HPI KAMIRYN BEZANSON is a 68 y.o. female with a history of rheumatoid arthritis for 40 years reports yesterday at about 11 she got back from walking her dogs and sat down for medicine went to stand up and almost fell.  She developed numbness and tingling of the right side especially the right hand and arm and left leg weakness.  This is persisted.  She called her brother-in-law who is a family practitioner and was told to come to the emergency room.  Symptoms have not gotten better or worse.         Past Medical History:  Diagnosis Date  . Arthritis    RA  . Cough    CHRONIC  . Depression   . GERD (gastroesophageal reflux disease)   . Headache    H/O MIGRAINES  . History of deep vein thrombosis    Right calf  . History of peritonitis 2002?   Right calf.  . Hypertension   . Hypothyroidism   . PONV (postoperative nausea and vomiting)    NO PROBLEM WITH CATARACT SURGERY  . Thyroid disease     Patient Active Problem List   Diagnosis Date Noted  . Cholecystitis with cholelithiasis 06/04/2016  . Gallstones without obstruction of gallbladder 05/21/2016  . Rheumatoid arthritis of wrist (HCC) 05/21/2016  . Hypercholesteremia 04/09/2016  . Avitaminosis D 04/09/2016  . 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 Vitamin B deficiency 07/12/2010  . Anxiety, generalized 06/29/2010  . Essential (primary) hypertension 05/02/2008  . Adult hypothyroidism 02/10/2008  . Clinical depression 03/17/2007  . Allergic rhinitis 11/04/2005  . Arthritis or polyarthritis, rheumatoid (HCC) 11/04/2005  . Herpes 11/04/2005    Past Surgical History:  Procedure Laterality Date  . ABDOMINAL EXPLORATION  SURGERY    . APPENDECTOMY  2001   Laparoscopy, Dx-Peritontis  . CATARACT EXTRACTION W/PHACO Right 09/03/2018   Procedure: CATARACT EXTRACTION PHACO AND INTRAOCULAR LENS PLACEMENT (IOC);  Surgeon: Galen Manila, MD;  Location: [facility] ORS;  Service: Ophthalmology;  Laterality: Right;  [name] 00:31.5  AP% 14.9 CDE 4.70 Fluid pack Lot # [id number]  . CATARACT EXTRACTION W/PHACO Left 10/20/2018   Procedure: CATARACT EXTRACTION PHACO AND INTRAOCULAR LENS PLACEMENT (IOC);  Surgeon: Galen Manila, MD;  Location: [facility] ORS;  Service: Ophthalmology;  Laterality: Left;  [name] 00:23 CDE 3.50 Fluid pack lot # [id number]  . CHOLECYSTECTOMY N/A 06/04/2016   Procedure: LAPAROSCOPIC CHOLECYSTECTOMY WITH INTRAOPERATIVE CHOLANGIOGRAM;  Surgeon: Earline Mayotte, MD;  Location: [facility] ORS;  Service: General;  Laterality: N/A;  . CHOLECYSTECTOMY  06/04/2016   Procedure: CHOLECYSTECTOMY;  Surgeon: Earline Mayotte, MD;  Location: [facility] ORS;  Service: General;;  Laparoscopic converted to open  . DIAGNOSTIC LAPAROSCOPY    . HAND SURGERY Left   . KNEE SURGERY Right   . Reconstruction of the right hand  2006   surgery  . WRIST SURGERY Left 1989   Dx-RA    Prior to Admission medications   Medication Sig Start Date End Date Taking? Authorizing Provider  cholecalciferol (VITAMIN D) 1000 units tablet Take 1,000 Units by mouth daily.     [provider]  doxycycline (VIBRA-TABS) 100 MG tablet Take 1 tablet (100 mg total) by mouth 2 (two) times daily. 10/05/18  Anola Gurneyhauvin, Robert, PA  DULoxetine (CYMBALTA) 30 MG capsule TAKE 1 CAPSULE(30 MG) BY MOUTH DAILY 08/10/19   Maple HudsonGilbert, Richard L Jr., MD  levothyroxine (SYNTHROID) 75 MCG tablet TAKE 1 TABLET EVERY DAY BEFORE BREAKFAST 08/19/19   Maple HudsonGilbert, Richard L Jr., MD  Lysine 1000 MG TABS Take 1 tablet by mouth daily.    [provider]  omeprazole (PRILOSEC) 20 MG capsule TAKE 1 CAPSULE(20 MG) BY MOUTH DAILY 03/25/19   Maple HudsonGilbert, Richard L Jr., MD  predniSONE (DELTASONE) 5  MG tablet Take 5 mg by mouth daily with breakfast.    [provider]  triamterene-hydrochlorothiazide (MAXZIDE-25) 37.5-25 MG tablet TAKE 1 TABLET EVERY DAY 08/19/19   Maple HudsonGilbert, Richard L Jr., MD    Allergies Remicade [infliximab], Shellfish-derived products, Ciprofloxacin, Demerol [meperidine], Nsaids, Tolmetin, Aspirin, Clinoril [sulindac], Codeine, Methotrexate, Methotrexate derivatives, Penicillins, and Shellfish allergy  Family History  Problem Relation Age of Onset  . Hypertension Brother     Social History Social History   Tobacco Use  . Smoking status: Former Smoker    Types: Cigarettes  . Smokeless tobacco: Never Used  Substance Use Topics  . Alcohol use: Yes    Comment: Occasional, maybe 1-2 a month  . Drug use: No    Review of Systems  Constitutional: No fever/chills Eyes: No visual changes. ENT: No sore throat. Cardiovascular: Denies chest pain. Respiratory: Denies shortness of breath. Gastrointestinal: No abdominal pain.  No nausea, no vomiting.  No diarrhea.  No constipation. Genitourinary: Negative for dysuria. Musculoskeletal: Negative for back pain. Skin: Negative for rash. Neurological: Negative for headaches  ____________________________________________   PHYSICAL EXAM:  VITAL SIGNS: ED Triage Vitals  Enc Vitals Group     BP 09/04/19 1058 (!) 167/118     Pulse Rate 09/04/19 1058 (!) 114     Resp 09/04/19 1058 20     Temp 09/04/19 1058 98.2 F (36.8 C)     Temp Source 09/04/19 1058 Oral     SpO2 09/04/19 1058 99 %     Weight 09/04/19 1059 200 lb (90.7 kg)     Height 09/04/19 1059 5\' 6"  (1.676 m)     Head Circumference --      Peak Flow --      Pain Score 09/04/19 1105 0     Pain Loc --      Pain Edu? --      Excl. in GC? --     Constitutional: Alert and oriented. Well appearing and in no acute distress. Eyes: Conjunctivae are normal. PER. EOMI. Head: Atraumatic. Nose: No congestion/rhinnorhea. Mouth/Throat: Mucous membranes  are moist.  Oropharynx non-erythematous. Neck: No stridor.  Cardiovascular: Normal rate, regular rhythm. Grossly normal heart sounds.  Good peripheral circulation. Respiratory: Normal respiratory effort.  No retractions. Lungs CTAB. Gastrointestinal: Soft and nontender. No distention. No abdominal bruits. No CVA tenderness. Musculoskeletal: No lower extremity tenderness nor edema.   Neurologic:  Normal speech and language.  Cranial nerves II through XII are intact although visual fields were not checked cerebellar finger-nose is slightly slow bilaterally patient is unable to do rapid alternating movements fast due to changes from her rheumatoid arthritis.  Grip on the right side is slightly weaker than the left although there is no palmar drift and right leg is noticeably weaker than the right with inability to maintain the leg up off the bed. Skin:  Skin is warm, dry and intact. No rash noted. Psychiatric: Mood and affect are normal. Speech and behavior are normal.  ____________________________________________  LABS (all labs ordered are listed, but only abnormal results are displayed)  Labs Reviewed  COMPREHENSIVE METABOLIC PANEL - Abnormal; Notable for the following components:      Result Value   Potassium 3.2 (*)    Glucose, Bld 113 (*)    Creatinine, Ser 1.09 (*)    Total Protein 6.3 (*)    GFR calc non Af Amer 53 (*)    All other components within normal limits  PROTIME-INR  APTT  CBC  DIFFERENTIAL  FIBRIN DERIVATIVES D-DIMER ([facility] ONLY)  I-STAT CREATININE, ED  CBG MONITORING, ED   ____________________________________________  EKG  EKG read interpreted by me shows sinus tachycardia rate of 106 normal axis no acute ST-T changes ____________________________________________  RADIOLOGY  ED MD interpretation: CT read by radiology reviewed by me shows no acute changes       Official radiology report(s): Ct Head Wo Contrast  Result Date: 09/04/2019 CLINICAL DATA:  Pt to  ED via POV for "pins and needles" right hand and weakness in her right leg. Pt states that she first noticed symptoms around 1000 or 1100 yesterday. Last known well between 0930-1030 yesterday. EXAM: CT HEAD WITHOUT CONTRAST TECHNIQUE: Contiguous axial images were obtained from the base of the skull through the vertex without intravenous contrast. COMPARISON:  None. FINDINGS: Brain: No evidence of acute infarction, hemorrhage, hydrocephalus, extra-axial collection or mass lesion/mass effect. Vascular: No hyperdense vessel or unexpected calcification. Skull: Normal. Negative for fracture or focal lesion. Sinuses/Orbits: No acute finding. Other: None. IMPRESSION: No acute intracranial process. Electronically Signed   By: Audie Pinto M.D.   On: 09/04/2019 12:04    ____________________________________________   PROCEDURES  Procedure(s) performed (including Critical Care): Critical care time 30 minutes this included reviewing the patient's old records and studies evaluating the patient and talking to the hospital physician.  Procedures   ____________________________________________   INITIAL IMPRESSION / ASSESSMENT AND PLAN / ED COURSE Patient with symptoms consistent with stroke occurring yesterday.  She is no longer TPA candidate.  We will give her aspirin get her in the hospital and continue her work-up.          Clinical Course as of Sep 04 1431  Sat Sep 04, 2019  1415 Fibrin derivatives D-Dimer [facility] only) [PM]    Clinical Course User Index [PM] Nena Polio, MD     ____________________________________________   FINAL CLINICAL IMPRESSION(S) / ED DIAGNOSES  Final diagnoses:  Cerebrovascular accident (CVA), unspecified mechanism [facility])     ED Discharge Orders    None       Note:  This document was prepared using Dragon voice recognition software and may include unintentional dictation errors.    Nena Polio, MD 09/04/19 1436    Nena Polio, MD  09/13/19 2033

## 2019-09-04 NOTE — ED Notes (Signed)
Patient ambulated to hallway bathroom.  

## 2019-09-04 NOTE — ED Triage Notes (Signed)
Pt to ED via POV for "pins and needles" right hand and weakness in her right leg. Pt states that she first noticed symptoms around 1000 or 1100 yesterday. Last known well between 0930-1030 yesterday. Pt has difficulty ambulating in triage. No facial droop noted at this time, no slurred speech. Pt is in NAD.

## 2019-09-05 ENCOUNTER — Inpatient Hospital Stay
Admit: 2019-09-05 | Discharge: 2019-09-05 | Disposition: A | Discharge status: Home / Self Care | Payer: Medicare PPO | Attending: Nurse Practitioner | Admitting: Nurse Practitioner

## 2019-09-05 ENCOUNTER — Inpatient Hospital Stay: Payer: Medicare PPO

## 2019-09-05 ENCOUNTER — Other Ambulatory Visit: Payer: Self-pay | Admitting: Internal Medicine

## 2019-09-05 DIAGNOSIS — I639 Cerebral infarction, unspecified: Principal | ICD-10-CM

## 2019-09-05 LAB — LIPID PANEL
Cholesterol: 190 mg/dL (ref 0–200)
HDL: 52 mg/dL (ref >40)
LDL Cholesterol: 117 mg/dL — ABNORMAL HIGH (ref 0–99)
Total CHOL/HDL Ratio: 3.7 RATIO
Triglycerides: 103 mg/dL (ref <150)
VLDL: 21 mg/dL (ref 0–40)

## 2019-09-05 LAB — BASIC METABOLIC PANEL
Anion gap: 8 (ref 5–15)
BUN: 13 mg/dL (ref 8–23)
CO2: 28 mmol/L (ref 22–32)
Calcium: 9.3 mg/dL (ref 8.9–10.3)
Chloride: 106 mmol/L (ref 98–111)
Creatinine, Ser: 0.89 mg/dL (ref 0.44–1.00)
GFR calc Af Amer: >60 mL/min (ref >60)
GFR calc non Af Amer: >60 mL/min (ref >60)
Glucose, Bld: 94 mg/dL (ref 70–99)
Potassium: 4.1 mmol/L (ref 3.5–5.1)
Sodium: 142 mmol/L (ref 135–145)

## 2019-09-05 LAB — CBC
HCT: 39 % (ref 36.0–46.0)
Hemoglobin: 12.4 g/dL (ref 12.0–15.0)
MCH: 26.7 pg (ref 26.0–34.0)
MCHC: 31.8 g/dL (ref 30.0–36.0)
MCV: 84.1 fL (ref 80.0–100.0)
Platelets: 242 10*3/uL (ref 150–400)
RBC: 4.64 MIL/uL (ref 3.87–5.11)
RDW: 13.2 % (ref 11.5–15.5)
WBC: 8.1 10*3/uL (ref 4.0–10.5)
nRBC: 0 % (ref 0.0–0.2)

## 2019-09-05 LAB — HEMOGLOBIN A1C
Hgb A1c MFr Bld: 5.5 % (ref 4.8–5.6)
Mean Plasma Glucose: 111.15 mg/dL

## 2019-09-05 LAB — SARS CORONAVIRUS 2 (TAT 6-24 HRS): SARS Coronavirus 2: NEGATIVE

## 2019-09-05 MED ORDER — ASPIRIN EC 81 MG PO TBEC
81.0000 mg | DELAYED_RELEASE_TABLET | Freq: Every day | ORAL | Status: DC
  Administered 2019-09-05: 81 mg via ORAL
  Filled 2019-09-05: qty 1

## 2019-09-05 MED ORDER — GADOBUTROL 1 MMOL/ML IV SOLN
9.0000 mL | Freq: Once | INTRAVENOUS | Status: AC | PRN
  Administered 2019-09-05: 9 mL via INTRAVENOUS

## 2019-09-05 MED ORDER — ATORVASTATIN CALCIUM 20 MG PO TABS: 40.0000 mg | ORAL_TABLET | Freq: Every day | ORAL | Status: DC

## 2019-09-05 MED ORDER — CLOPIDOGREL BISULFATE 75 MG PO TABS: 75.0000 mg | ORAL_TABLET | Freq: Every day | ORAL | Status: DC

## 2019-09-05 MED ORDER — ASPIRIN 81 MG PO TBEC: 81.0000 mg | DELAYED_RELEASE_TABLET | Freq: Every day | ORAL | 0 refills | Status: AC

## 2019-09-05 MED ORDER — ATORVASTATIN CALCIUM 40 MG PO TABS: 40.0000 mg | ORAL_TABLET | Freq: Every day | ORAL | 0 refills | Status: DC

## 2019-09-05 NOTE — Evaluation (Signed)
Physical Therapy Evaluation Patient Details Name: Angela Sherman MRN: [id number] DOB: March 22, 1951 Today's Date: 09/05/2019   History of Present Illness  From MD neuro consult note: Pt is a 68 y.o. female with past medical history of depression and anxiety, GERD, DVT, peritonitis, hypertension, hypothyroidism, and rheumatoid arthritis presenting to the ED with chief complaints of numbness and tingling in her right hand and right leg, and right leg weakness. Pt last known well night prior to evaluation/admission.  Symptoms improved but still has numbness on the RUE.  She has L corona radiata stroke.    Clinical Impression  Pt presents with minor deficits in strength, gait, balance, and activity tolerance.  Pt with noted weakness in the RLE and RUE per below compared to the L side but available strength was functional.  Some deficits in coordination to both the RUE and RLE were noted but fairly mild overall.  No deficits to sensation to light touch or proprioception to the RUE or RLE.  No visual deficits noted. Pt was Ind with bed mobility and transfers with good speed and effort performing tasks.  Pt was able to amb 1 x 200' and 1 x 150' with a RW and SBA with pt initially ambulating with step-to pattern but progressed to step-through.  Pt c/o feeling weaker in the RLE than the LLE during amb but reported feeling safe with no concern of buckling as amb progressed.  Pt was able to ascend and descend 2 steps with one rail with training provided on proper sequencing.  Frequency set at 2x/wk secondary to pt's overall high level of function during PT evaluation.  Pt will benefit from HHPT services upon discharge to safely address above deficits for decreased caregiver assistance and eventual return to PLOF.        Follow Up Recommendations Home health PT    Equipment Recommendations  Rolling walker with 5" wheels    Recommendations for Other Services       Precautions / Restrictions  Precautions Precautions: None Restrictions Weight Bearing Restrictions: No      Mobility  Bed Mobility Overal bed mobility: Independent                Transfers Overall transfer level: Independent               General transfer comment: Good eccentric and concentric control with transfers  Ambulation/Gait Ambulation/Gait assistance: Supervision Gait Distance (Feet): 200 Feet Assistive device: Rolling walker (2 wheeled) Gait Pattern/deviations: Step-to pattern;Step-through pattern Gait velocity: decreased   General Gait Details: Step-to pattern initially progressing to step-through pattern  Stairs Stairs: Yes Stairs assistance: Supervision Stair Management: One rail Right Number of Stairs: 2 General stair comments: Good eccentric and concentric control ascending and descending steps with min verbal and visual cues for proper sequencing  Wheelchair Mobility    Modified Rankin (Stroke Patients Only)       Balance Overall balance assessment: Mild deficits observed, not formally tested                                           Pertinent Vitals/Pain Pain Assessment: No/denies pain    Home Living Family/patient expects to be discharged to:: Private residence Living Arrangements: Alone Available Help at Discharge: Family;Available PRN/intermittently Type of Home: House Home Access: Stairs to enter Entrance Stairs-Rails: Right;Left(Too wide to use both) Entrance Stairs-Number of Steps: 5 Home Layout:  Two level;Able to live on main level with bedroom/bathroom Home Equipment: Gilmer Mor - single point      Prior Function Level of Independence: Independent         Comments: Ind amb community distances without an AD, no falls, Ind with ADLs     Hand Dominance   Dominant Hand: Right    Extremity/Trunk Assessment   Upper Extremity Assessment Upper Extremity Assessment: RUE deficits/detail RUE Deficits / Details: RUE shoulder and  elbow strength 4-/5; hands not assessed with h/o RA/surgeries; LUE WNL RUE Sensation: WNL RUE Coordination: decreased fine motor(decreased RAM and FTN tests on the RUE compared to the LUE)    Lower Extremity Assessment Lower Extremity Assessment: RLE deficits/detail RLE Deficits / Details: RLE hip and knee strength 4-/5, ankle DF 5/5; LLE WNL RLE Sensation: WNL RLE Coordination: decreased gross motor       Communication   Communication: No difficulties  Cognition Arousal/Alertness: Awake/alert Behavior During Therapy: WFL for tasks assessed/performed Overall Cognitive Status: Within Functional Limits for tasks assessed                                 General Comments: A&O x 4, pleasant and conversive      General Comments      Exercises Other Exercises Other Exercises: Gait training with step-to gait sequencing initially progressing to training for step-through pattern Other Exercises: Stair training with verbal instructions and visual demonstration for proper sequencing   Assessment/Plan    PT Assessment Patient needs continued PT services  PT Problem List Decreased strength;Decreased activity tolerance;Decreased balance;Decreased knowledge of use of DME       PT Treatment Interventions DME instruction;Gait training;Stair training;Functional mobility training;Therapeutic activities;Therapeutic exercise;Balance training;Patient/family education    PT Goals (Current goals can be found in the Care Plan section)  Acute Rehab PT Goals Patient Stated Goal: To get stronger PT Goal Formulation: With patient Time For Goal Achievement: 09/18/19 Potential to Achieve Goals: Good    Frequency Min 2X/week   Barriers to discharge        Co-evaluation               AM-PAC PT "6 Clicks" Mobility  Outcome Measure Help needed turning from your back to your side while in a flat bed without using bedrails?: None Help needed moving from lying on your back to  sitting on the side of a flat bed without using bedrails?: None Help needed moving to and from a bed to a chair (including a wheelchair)?: None Help needed standing up from a chair using your arms (e.g., wheelchair or bedside chair)?: None Help needed to walk in hospital room?: A Little Help needed climbing 3-5 steps with a railing? : A Little 6 Click Score: 22    End of Session Equipment Utilized During Treatment: Gait belt Activity Tolerance: Patient tolerated treatment well Patient left: in bed;with call bell/phone within reach Nurse Communication: Mobility status PT Visit Diagnosis: Difficulty in walking, not elsewhere classified (R26.2);Muscle weakness (generalized) (M62.81);Hemiplegia and hemiparesis Hemiplegia - Right/Left: Right Hemiplegia - dominant/non-dominant: Dominant Hemiplegia - caused by: Cerebral infarction    Time: 1022-1100 PT Time Calculation (min) (ACUTE ONLY): 38 min   Charges:   PT Evaluation $PT Eval Moderate Complexity: 1 Mod PT Treatments $Gait Training: 8-22 mins        D. Scott Keyra Virella PT, DPT 09/05/19, 11:30 AM

## 2019-09-05 NOTE — Discharge Instructions (Signed)
°  It was so nice to meet you during this hospitalization!  You came into the hospital with weakness of your right arm and leg. We did an MRI, which showed that you had a new stroke.  I have prescribed the following medications: 1. Please take aspirin 81mg  daily to help decrease your risk of having a heart attack or stroke in the future 2. Please take lipitor 40mg  daily to help bring down your cholesterol level  Please make sure you follow-up with your primary care doctor and the neurologist in the next couple of weeks.  Take care, Dr. Brett Albino

## 2019-09-05 NOTE — TOC Transition Note (Signed)
Transition of Care [facility]) - CM/SW Discharge Note   Patient Details  Name: Angela Sherman MRN: [id number] Date of Birth: 06/24/1951  Transition of Care [facility]) CM/SW Contact:  Latanya Maudlin, RN Phone Number: 09/05/2019, 12:24 PM   Clinical Narrative:  Patient to be discharged per MD order. Orders in place for home health services. CMS Medicare.gov Compare Post Acute Care list reviewed with patient and she has no preference of agency. Referral placed with Tillie Rung at [facility] for PT/OT. Patient in need of DME rolling walker, obtained from [employer] and brought to bedside. Family to transport.      Final next level of care: [place] Barriers to Discharge: No Barriers Identified   Patient Goals and CMS Choice     Choice offered to / list presented to : Patient  Discharge Placement                       Discharge Plan and Services                DME Arranged: Walker rolling DME Agency: [employer]       HH Arranged: PT, OT HH Agency: [facility] Date [place] Agency Contacted: 09/05/19 Time [place]: 1223 Representative spoke with at [place]: [place]) Interventions     Readmission Risk Interventions Readmission Risk Prevention Plan 09/05/2019  Post Dischage Appt Complete  Medication Screening Complete  Transportation Screening Complete  Some recent data might be hidden

## 2019-09-05 NOTE — Discharge Summary (Signed)
[facility] [place] at [facility]   PATIENT NAME: Angela Sherman    MR#:  [id number]  DATE OF BIRTH:  [birth year]  DATE OF ADMISSION:  09/04/2019   ADMITTING PHYSICIAN: Jimmye Norman, NP  DATE OF DISCHARGE: 09/05/19  PRIMARY CARE PHYSICIAN: Maple Hudson., MD   ADMISSION DIAGNOSIS:  Stroke (cerebrum) [facility] [facility]) [I63.9] Cerebrovascular accident (CVA), unspecified mechanism (HCC) [I63.9] DISCHARGE DIAGNOSIS:  Active Problems:   CVA (cerebral vascular accident) (HCC)  SECONDARY DIAGNOSIS:   Past Medical History:  Diagnosis Date   Arthritis    RA   Cough    CHRONIC   Depression    GERD (gastroesophageal reflux disease)    Headache    H/O MIGRAINES   History of deep vein thrombosis    Right calf   History of peritonitis 2002?   Right calf.   Hypertension    Hypothyroidism    PONV (postoperative nausea and vomiting)    NO PROBLEM WITH CATARACT SURGERY   Thyroid disease    HOSPITAL COURSE:   Angela Sherman is a 68 year old female who presented to the ED with right arm and leg weakness and numbness.  In the ED, CT head was negative for acute findings.  She was felt to be outside the window for TPA.  MRI brain showed an acute infarct of the posterior left corona radiata.  She was started on aspirin 81 mg daily.  Lipid panel was significant for LDL 117, and patient was started on Lipitor 40 mg daily.  Echo was performed, but the read was pending on discharge.  She was evaluated by PT, who recommended home health PT and a rolling walker.  She was discharged home with PCP and neurology follow-up.  DISCHARGE CONDITIONS:  Acute CVA with right-sided weakness and numbness AKI-resolved Hypertension Hypothyroidism Depression GERD Rheumatoid arthritis on chronic prednisone CONSULTS OBTAINED:  Treatment Team:  Pauletta Browns, MD DRUG ALLERGIES:   Allergies  Allergen Reactions   Remicade [Infliximab] Anaphylaxis   Shellfish-Derived Products  Rash and Swelling   Ciprofloxacin Other (See Comments)    Unknown   Demerol [Meperidine] Nausea And Vomiting   Nsaids Other (See Comments)    Unknown   Tolmetin Other (See Comments)    Unknown   Aspirin Rash    Patient states she is able to tolerate aspirin   Clinoril [Sulindac] Rash   Codeine Nausea And Vomiting and Other (See Comments)    Severe vomiting   Methotrexate Nausea And Vomiting and Rash   Methotrexate Derivatives Rash and Cough   Penicillins Rash and Other (See Comments)    Has patient had a PCN reaction causing immediate rash, facial/tongue/throat swelling, SOB or lightheadedness with hypotension: Unknown Has patient had a PCN reaction causing severe rash involving mucus membranes or skin necrosis: No Has patient had a PCN reaction that required hospitalization: No Has patient had a PCN reaction occurring within the last 10 years: No If all of the above answers are "NO", then may proceed with Cephalosporin use.    Shellfish Allergy Swelling and Rash   DISCHARGE MEDICATIONS:   Allergies as of 09/05/2019      Reactions   Remicade [infliximab] Anaphylaxis   Shellfish-derived Products Rash, Swelling   Ciprofloxacin Other (See Comments)   Unknown   Demerol [meperidine] Nausea And Vomiting   Nsaids Other (See Comments)   Unknown   Tolmetin Other (See Comments)   Unknown   Aspirin Rash   Patient states she is able to tolerate aspirin  Clinoril [sulindac] Rash   Codeine Nausea And Vomiting, Other (See Comments)   Severe vomiting   Methotrexate Nausea And Vomiting, Rash   Methotrexate Derivatives Rash, Cough   Penicillins Rash, Other (See Comments)   Has patient had a PCN reaction causing immediate rash, facial/tongue/throat swelling, SOB or lightheadedness with hypotension: Unknown Has patient had a PCN reaction causing severe rash involving mucus membranes or skin necrosis: No Has patient had a PCN reaction that required hospitalization: No Has patient  had a PCN reaction occurring within the last 10 years: No If all of the above answers are "NO", then may proceed with Cephalosporin use.   Shellfish Allergy Swelling, Rash      Medication List    TAKE these medications   aspirin 81 MG EC tablet Take 1 tablet (81 mg total) by mouth daily. Start taking on: September 06, 2019   atorvastatin 40 MG tablet Commonly known as: LIPITOR Take 1 tablet (40 mg total) by mouth daily at 6 PM.   cholecalciferol 1000 units tablet Commonly known as: VITAMIN D Take 1,000 Units by mouth daily.   DULoxetine 30 MG capsule Commonly known as: CYMBALTA TAKE 1 CAPSULE(30 MG) BY MOUTH DAILY   levothyroxine 75 MCG tablet Commonly known as: SYNTHROID TAKE 1 TABLET EVERY DAY BEFORE BREAKFAST   omeprazole 20 MG capsule Commonly known as: PRILOSEC TAKE 1 CAPSULE(20 MG) BY MOUTH DAILY   predniSONE 5 MG tablet Commonly known as: DELTASONE Take 5-10 mg by mouth daily with breakfast.   triamterene-hydrochlorothiazide 37.5-25 MG tablet Commonly known as: MAXZIDE-25 TAKE 1 TABLET EVERY DAY            Durable Medical Equipment  (From admission, onward)         Start     Ordered   09/05/19 1154  For home use only DME Walker rolling  (Walkers)  Once    Question:  Patient needs a walker to treat with the following condition  Answer:  Stroke (HCC)   09/05/19 1153           DISCHARGE INSTRUCTIONS:  1.  Follow-up with PCP in 5 days 2.  Follow-up with neurology in 1 to 2 weeks 3.  Take aspirin 81 mg daily 4.  Take Lipitor 40 mg daily DIET:  Cardiac diet DISCHARGE CONDITION:  Stable ACTIVITY:  Activity as tolerated OXYGEN:  Home Oxygen: No.  Oxygen Delivery: room air DISCHARGE LOCATION:  home   If you experience worsening of your admission symptoms, develop shortness of breath, life threatening emergency, suicidal or homicidal thoughts you must seek medical attention immediately by calling 911 or calling your MD immediately  if symptoms  less severe.  You Must read complete instructions/literature along with all the possible adverse reactions/side effects for all the Medicines you take and that have been prescribed to you. Take any new Medicines after you have completely understood and accpet all the possible adverse reactions/side effects.   Please note  You were cared for by a hospitalist during your hospital stay. If you have any questions about your discharge medications or the care you received while you were in the hospital after you are discharged, you can call the unit and asked to speak with the hospitalist on call if the hospitalist that took care of you is not available. Once you are discharged, your primary care physician will handle any further medical issues. Please note that NO REFILLS for any discharge medications will be authorized once you are discharged, as it is  imperative that you return to your primary care physician (or establish a relationship with a primary care physician if you do not have one) for your aftercare needs so that they can reassess your need for medications and monitor your lab values.    On the day of Discharge:  VITAL SIGNS:  Blood pressure (!) 179/100, pulse (!) 57, temperature 97.6 F (36.4 C), temperature source Oral, resp. rate 20, height 5\' 6"  (1.676 m), weight 90.7 kg, SpO2 99 %. PHYSICAL EXAMINATION:  GENERAL:  68 y.o.-year-old patient lying in the bed with no acute distress.  EYES: Pupils equal, round, reactive to light and accommodation. No scleral icterus. Extraocular muscles intact.  HEENT: Head atraumatic, normocephalic. Oropharynx and nasopharynx clear.  NECK:  Supple, no jugular venous distention. No thyroid enlargement, no tenderness.  LUNGS: Normal breath sounds bilaterally, no wheezing, rales,rhonchi or crepitation. No use of accessory muscles of respiration.  CARDIOVASCULAR: S1, S2 normal. No murmurs, rubs, or gallops.  ABDOMEN: Soft, non-tender, non-distended. Bowel  sounds present. No organomegaly or mass.  EXTREMITIES: No pedal edema, cyanosis, or clubbing. +chronic decreased ROM of the bilateral fingers and wrists due to rheumatoid arthritis NEUROLOGIC: Cranial nerves II through XII are intact. Muscle strength 5/5 in the left upper extremity and left lower extremity.  Muscle strength 4/5 in the right upper extremity and right lower extremity.  Decreased sensation to light touch in the right arm and leg.  Gait not checked.  PSYCHIATRIC: The patient is alert and oriented x 3.  SKIN: No obvious rash, lesion, or ulcer.  DATA REVIEW:   CBC Recent Labs  Lab 09/05/19 0747  WBC 8.1  HGB 12.4  HCT 39.0  PLT 242    Chemistries  Recent Labs  Lab 09/04/19 1113 09/05/19 0747  NA 142 142  K 3.2* 4.1  CL 108 106  CO2 24 28  GLUCOSE 113* 94  BUN 17 13  CREATININE 1.09* 0.89  CALCIUM 9.2 9.3  AST 22  --   ALT 22  --   ALKPHOS 86  --   BILITOT 0.7  --      Microbiology Results  Results for orders placed or performed during the hospital encounter of 09/04/19  SARS CORONAVIRUS 2 (TAT 6-24 HRS) Nasopharyngeal Nasopharyngeal Swab     Status: None   Collection Time: 09/04/19  5:08 PM   Specimen: Nasopharyngeal Swab  Result Value Ref Range Status   SARS Coronavirus 2 NEGATIVE NEGATIVE Final    Comment: (NOTE) SARS-CoV-2 target nucleic acids are NOT DETECTED. The SARS-CoV-2 RNA is generally detectable in upper and lower respiratory specimens during the acute phase of infection. Negative results do not preclude SARS-CoV-2 infection, do not rule out co-infections with other pathogens, and should not be used as the sole basis for treatment or other patient management decisions. Negative results must be combined with clinical observations, patient history, and epidemiological information. The expected result is Negative. Fact Sheet for Patients: HairSlick.nohttps://www.fda.gov/media/138098/download Fact Sheet for Healthcare  Providers: quierodirigir.comhttps://www.fda.gov/media/138095/download This test is not yet approved or cleared by the [place] FDA and  has been authorized for detection and/or diagnosis of SARS-CoV-2 by FDA under an Emergency Use Authorization (EUA). This EUA will remain  in effect (meaning this test can be used) for the duration of the COVID-19 declaration under Section 56 4(b)(1) of the Act, 21 U.S.C. section 360bbb-3(b)(1), unless the authorization is terminated or revoked sooner. Performed at [facility], [address] [place], [place] [address]  RADIOLOGY:  Mr Angio Neck W Wo Contrast  Result Date: 09/05/2019 CLINICAL DATA:  Initial evaluation for acute right-sided weakness, stroke. EXAM: MRA NECK WITHOUT AND WITH CONTRAST TECHNIQUE: Multiplanar and multiecho pulse sequences of the neck were obtained without and with intravenous contrast. Angiographic images of the neck were obtained using MRA technique without and with intravenous contrast. CONTRAST:  9 cc of Gadavist. COMPARISON:  Concomitant brain MRI from same day. FINDINGS: Source images reviewed. AORTIC ARCH: Visualized aortic arch of normal caliber with normal branch pattern. No hemodynamically significant stenosis seen about the origin of the great vessels. Visualized subclavian arteries widely patent. RIGHT CAROTID SYSTEM: Right CCA tortuous proximally but widely patent to the bifurcation without flow-limiting stenosis. No significant atheromatous narrowing or stenosis about the right bifurcation. Right ICA tortuous but widely patent to the circle-of-Willis without stenosis, dissection or occlusion. LEFT CAROTID SYSTEM: Left common carotid artery tortuous proximally but widely patent to the bifurcation without stenosis. No significant atheromatous narrowing or stenosis about the left bifurcation. Left ICA tortuous but widely patent distally to the circle-of-Willis without stenosis, dissection or occlusion. VERTEBRAL ARTERIES: Both of  the vertebral arteries arise from the subclavian arteries. Right vertebral artery dominant, with a diffusely hypoplastic left vertebral artery. Vertebral arteries diffusely tortuous but widely patent within the neck without stenosis or other acute vascular abnormality. Hypoplastic left vertebral artery likely terminates in PICA. IMPRESSION: 1. Negative MRA of the neck with no hemodynamically significant or critical flow limiting stenosis identified. 2. Diffuse tortuosity of the major arterial vasculature of the neck, suggesting chronic underlying hypertension. 3. 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 Electronically Signed   By: Jeannine Boga M.D.   On: 09/05/2019 06:25   Mr Brain Wo Contrast  Result Date: 09/05/2019 CLINICAL DATA:  Initial evaluation for acute right-sided numbness and tingling. EXAM: MRI HEAD WITHOUT CONTRAST TECHNIQUE: Multiplanar, multiecho pulse sequences of the brain and surrounding structures were obtained without intravenous contrast. COMPARISON:  Prior CT from 09/04/2019. FINDINGS: Brain: Cerebral volume within normal limits for age. Mild chronic microvascular ischemic changes noted within the periventricular deep white matter both cerebral hemispheres. 16 mm acute ischemic nonhemorrhagic infarct noted at the posterior left corona radiata (series 5, image 30). No associated mass effect. No other evidence for acute or subacute ischemia. Gray-white matter differentiation otherwise maintained. No encephalomalacia to suggest chronic cortical infarction elsewhere within the brain. No foci of susceptibility artifact to suggest acute or chronic intracranial hemorrhage. No mass lesion, midline shift or mass effect. No hydrocephalus. No extra-axial fluid collection. Pituitary gland suprasellar region normal. Midline structures intact. Vascular: Major intracranial vascular flow voids are maintained. Skull and upper cervical spine: Craniocervical junction within  normal limits. Bone marrow signal intensity normal. No scalp soft tissue abnormality. Sinuses/Orbits: Patient status post bilateral ocular lens replacement. Globes orbital soft tissues demonstrate no acute finding. Paranasal sinuses are largely clear. No significant mastoid effusion. Inner ear structures normal. Other: None. IMPRESSION: 1. 16 mm acute ischemic nonhemorrhagic infarct involving the posterior left corona radiata. 2. Underlying mild chronic microvascular ischemic disease for age. Electronically Signed   By: Jeannine Boga M.D.   On: 09/05/2019 06:20     Management plans discussed with the patient, family and they are in agreement.  CODE STATUS: Full Code   TOTAL TIME TAKING CARE OF THIS PATIENT: 45 minutes.    Berna Spare Thi Klich M.D on 09/05/2019 at 11:58 AM  Between 7am to 6pm - Pager - [phone]  After 6pm go to [email or web address] -  password [email or web address] [facility]  [facility] [place] [facility]  Office  [phone]  CC: Primary care physician; Maple Hudson., MD   Note: This dictation was prepared with Dragon dictation along with smaller phrase technology. Any transcriptional errors that result from this process are unintentional.

## 2019-09-05 NOTE — Consult Note (Signed)
Reason for Consult:R side numbness  Referring Physician: Dr. Nancy Marus   CC: R sided numbness   HPI: Angela Sherman is an 68 y.o. female with past medical history of depression and anxiety, GERD, DVT, peritonitis, hypertension, hypothyroidism, and rheumatoid arthritis presenting to the ED with chief complaints of numbness and tingling in her right hand and right leg, and right leg weakness. Pt last known well night prior to evaluation/admission.  Symptoms improved but still has numbness on the RUE.  She has L corona radiata stroke.  No anti platelet therapy prior to admission.     Past Medical History:  Diagnosis Date  . Arthritis    RA  . Cough    CHRONIC  . Depression   . GERD (gastroesophageal reflux disease)   . Headache    H/O MIGRAINES  . History of deep vein thrombosis    Right calf  . History of peritonitis 2002?   Right calf.  . Hypertension   . Hypothyroidism   . PONV (postoperative nausea and vomiting)    NO PROBLEM WITH CATARACT SURGERY  . Thyroid disease     Past Surgical History:  Procedure Laterality Date  . ABDOMINAL EXPLORATION SURGERY    . APPENDECTOMY  2001   Laparoscopy, Dx-Peritontis  . CATARACT EXTRACTION W/PHACO Right 09/03/2018   Procedure: CATARACT EXTRACTION PHACO AND INTRAOCULAR LENS PLACEMENT (IOC);  Surgeon: Galen Manila, MD;  Location: [facility] ORS;  Service: Ophthalmology;  Laterality: Right;  [name] 00:31.5  AP% 14.9 CDE 4.70 Fluid pack Lot # [id number]  . CATARACT EXTRACTION W/PHACO Left 10/20/2018   Procedure: CATARACT EXTRACTION PHACO AND INTRAOCULAR LENS PLACEMENT (IOC);  Surgeon: Galen Manila, MD;  Location: [facility] ORS;  Service: Ophthalmology;  Laterality: Left;  [name] 00:23 CDE 3.50 Fluid pack lot # [id number]  . CHOLECYSTECTOMY N/A 06/04/2016   Procedure: LAPAROSCOPIC CHOLECYSTECTOMY WITH INTRAOPERATIVE CHOLANGIOGRAM;  Surgeon: Earline Mayotte, MD;  Location: [facility] ORS;  Service: General;  Laterality: N/A;  . CHOLECYSTECTOMY  06/04/2016    Procedure: CHOLECYSTECTOMY;  Surgeon: Earline Mayotte, MD;  Location: [facility] OR[facility];  Service: General;;  Laparoscopic converted to open  . DIAGNOSTIC LAPAROSCOPY    . HAND SURGERY Left   . KNEE SURGERY Right   . Reconstruction of the right hand  2006   surgery  . WRIST SURGERY Left 1989   Dx-RA    Family History  Problem Relation Age of Onset  . Hypertension Brother     Social History:  reports that she has quit smoking. Her smoking use included cigarettes. She has never used smokeless tobacco. She reports current alcohol use. She reports that she does not use drugs.  Allergies  Allergen Reactions  . Remicade [Infliximab] Anaphylaxis  . Shellfish-Derived Products Rash and Swelling  . Ciprofloxacin Other (See Comments)    Unknown  . Demerol [Meperidine] Nausea And Vomiting  . Nsaids Other (See Comments)    Unknown  . Tolmetin Other (See Comments)    Unknown  . Aspirin Rash    Patient states she is able to tolerate aspirin  . Clinoril [Sulindac] Rash  . Codeine Nausea And Vomiting and Other (See Comments)    Severe vomiting  . Methotrexate Nausea And Vomiting and Rash  . Methotrexate Derivatives Rash and Cough  . Penicillins Rash and Other (See Comments)    Has patient had a PCN reaction causing immediate rash, facial/tongue/throat swelling, SOB or lightheadedness with hypotension: Unknown Has patient had a PCN reaction causing severe rash involving mucus membranes or skin  necrosis: No Has patient had a PCN reaction that required hospitalization: No Has patient had a PCN reaction occurring within the last 10 years: No If all of the above answers are "NO", then may proceed with Cephalosporin use.   . Shellfish Allergy Swelling and Rash    Medications: I have reviewed the patient's current medications.  ROS:   General ROS: negative for - chills, fatigue, fever, night sweats, weight gain or weight loss Psychological ROS: negative for - behavioral disorder, hallucinations,  memory difficulties, mood swings or suicidal ideation Ophthalmic ROS: negative for - blurry vision, double vision, eye pain or loss of vision ENT ROS: negative for - epistaxis, nasal discharge, oral lesions, sore throat, tinnitus or vertigo Allergy and Immunology ROS: negative for - hives or itchy/watery eyes Hematological and Lymphatic ROS: negative for - bleeding problems, bruising or swollen lymph nodes Endocrine ROS: negative for - galactorrhea, hair pattern changes, polydipsia/polyuria or temperature intolerance Respiratory ROS: negative for - cough, hemoptysis, shortness of breath or wheezing Cardiovascular ROS: negative for - chest pain, dyspnea on exertion, edema or irregular heartbeat Gastrointestinal ROS: negative for - abdominal pain, diarrhea, hematemesis, nausea/vomiting or stool incontinence Genito-Urinary ROS: negative for - dysuria, hematuria, incontinence or urinary frequency/urgency Musculoskeletal ROS: negative for - joint swelling or muscular weakness Neurological ROS: as noted in HPI Dermatological ROS: negative for rash and skin lesion changes  Physical Examination: Blood pressure (!) 179/100, pulse (!) 57, temperature 97.6 F (36.4 C), temperature source Oral, resp. rate 20, height 5\' 6"  (1.676 m), weight 90.7 kg, SpO2 99 %.    Neurological Examination   Mental Status: Alert, oriented, thought content appropriate.  Speech fluent without evidence of aphasia.  Able to follow 3 step commands without difficulty. Cranial Nerves: II: Discs flat bilaterally; Visual fields grossly normal, pupils equal, round, reactive to light and accommodation III,IV, VI: ptosis not present, extra-ocular motions intact bilaterally V,VII: smile symmetric, facial light touch sensation normal bilaterally VIII: hearing normal bilaterally IX,X: gag reflex present XI: bilateral shoulder shrug XII: midline tongue extension Motor: Right : Upper extremity   5/5    Left:     Upper extremity    5/5  Lower extremity   5/5     Lower extremity   5/5 Tone and bulk:normal tone throughout; no atrophy noted Sensory: decreased sensation RUE and minimally RLE to touch and pain.  Deep Tendon Reflexes: 1+ and symmetric throughout Plantars: Right: downgoing   Left: downgoing Cerebellar: normal finger-to-nose, normal rapid alternating movements and normal heel-to-shin test Gait: not tested      Laboratory Studies:   Basic Metabolic Panel: Recent Labs  Lab 09/04/19 1113 09/05/19 0747  NA 142 142  K 3.2* 4.1  CL 108 106  CO2 24 28  GLUCOSE 113* 94  BUN 17 13  CREATININE 1.09* 0.89  CALCIUM 9.2 9.3    Liver Function Tests: Recent Labs  Lab 09/04/19 1113  AST 22  ALT 22  ALKPHOS 86  BILITOT 0.7  PROT 6.3*  ALBUMIN 3.7   No results for input(s): LIPASE, AMYLASE in the last 168 hours. No results for input(s): AMMONIA in the last 168 hours.  CBC: Recent Labs  Lab 09/04/19 1113 09/05/19 0747  WBC 7.7 8.1  NEUTROABS 5.1  --   HGB 13.2 12.4  HCT 41.8 39.0  MCV 84.3 84.1  PLT 245 242    Cardiac Enzymes: No results for input(s): CKTOTAL, CKMB, CKMBINDEX, TROPONINI in the last 168 hours.  BNP: Invalid input(s): POCBNP  CBG: No results for input(s): GLUCAP in the last 168 hours.  Microbiology: Results for orders placed or performed during the hospital encounter of 09/04/19  SARS CORONAVIRUS 2 (TAT 6-24 HRS) Nasopharyngeal Nasopharyngeal Swab     Status: None   Collection Time: 09/04/19  5:08 PM   Specimen: Nasopharyngeal Swab  Result Value Ref Range Status   SARS Coronavirus 2 NEGATIVE NEGATIVE Final    Comment: (NOTE) SARS-CoV-2 target nucleic acids are NOT DETECTED. The SARS-CoV-2 RNA is generally detectable in upper and lower respiratory specimens during the acute phase of infection. Negative results do not preclude SARS-CoV-2 infection, do not rule out co-infections with other pathogens, and should not be used as the sole basis for treatment or other  patient management decisions. Negative results must be combined with clinical observations, patient history, and epidemiological information. The expected result is Negative. Fact Sheet for Patients: HairSlick.no Fact Sheet for Healthcare Providers: quierodirigir.com This test is not yet approved or cleared by the [place] FDA and  has been authorized for detection and/or diagnosis of SARS-CoV-2 by FDA under an Emergency Use Authorization (EUA). This EUA will remain  in effect (meaning this test can be used) for the duration of the COVID-19 declaration under Section 56 4(b)(1) of the Act, 21 U.S.C. section 360bbb-3(b)(1), unless the authorization is terminated or revoked sooner. Performed at [facility], [address], [place], [place] [address]     Coagulation Studies: Recent Labs    09/04/19 1113  LABPROT 12.5  INR 0.9    Urinalysis: No results for input(s): COLORURINE, LABSPEC, PHURINE, GLUCOSEU, HGBUR, BILIRUBINUR, KETONESUR, PROTEINUR, UROBILINOGEN, NITRITE, LEUKOCYTESUR in the last 168 hours.  Invalid input(s): APPERANCEUR  Lipid Panel:     Component Value Date/Time   CHOL 190 09/05/2019 0538   CHOL 171 08/27/2018 0837   TRIG 103 09/05/2019 0538   HDL 52 09/05/2019 0538   HDL 48 08/27/2018 0837   CHOLHDL 3.7 09/05/2019 0538   VLDL 21 09/05/2019 0538   LDLCALC 117 (H) 09/05/2019 0538   LDLCALC 101 (H) 08/27/2018 0837    HgbA1C:  Lab Results  Component Value Date   HGBA1C 5.5 09/05/2019    Urine Drug Screen:  No results found for: LABOPIA, COCAINSCRNUR, LABBENZ, AMPHETMU, THCU, LABBARB  Alcohol Level: No results for input(s): ETH in the last 168 hours.  Other results: EKG: normal EKG, normal sinus rhythm, unchanged from previous tracings.  Imaging: Ct Head Wo Contrast  Result Date: 09/04/2019 CLINICAL DATA:  Pt to ED via POV for "pins and needles" right hand and weakness in her right  leg. Pt states that she first noticed symptoms around 1000 or 1100 yesterday. Last known well between 0930-1030 yesterday. EXAM: CT HEAD WITHOUT CONTRAST TECHNIQUE: Contiguous axial images were obtained from the base of the skull through the vertex without intravenous contrast. COMPARISON:  None. FINDINGS: Brain: No evidence of acute infarction, hemorrhage, hydrocephalus, extra-axial collection or mass lesion/mass effect. Vascular: No hyperdense vessel or unexpected calcification. Skull: Normal. Negative for fracture or focal lesion. Sinuses/Orbits: No acute finding. Other: None. IMPRESSION: No acute intracranial process. Electronically Signed   By: Emmaline Kluver M.D.   On: 09/04/2019 12:04   Mr Angio Neck W Wo Contrast  Result Date: 09/05/2019 CLINICAL DATA:  Initial evaluation for acute right-sided weakness, stroke. EXAM: MRA NECK WITHOUT AND WITH CONTRAST TECHNIQUE: Multiplanar and multiecho pulse sequences of the neck were obtained without and with intravenous contrast. Angiographic images of the neck were obtained using MRA technique  without and with intravenous contrast. CONTRAST:  9 cc of Gadavist. COMPARISON:  Concomitant brain MRI from same day. FINDINGS: Source images reviewed. AORTIC ARCH: Visualized aortic arch of normal caliber with normal branch pattern. No hemodynamically significant stenosis seen about the origin of the great vessels. Visualized subclavian arteries widely patent. RIGHT CAROTID SYSTEM: Right CCA tortuous proximally but widely patent to the bifurcation without flow-limiting stenosis. No significant atheromatous narrowing or stenosis about the right bifurcation. Right ICA tortuous but widely patent to the circle-of-Willis without stenosis, dissection or occlusion. LEFT CAROTID SYSTEM: Left common carotid artery tortuous proximally but widely patent to the bifurcation without stenosis. No significant atheromatous narrowing or stenosis about the left bifurcation. Left ICA tortuous  but widely patent distally to the circle-of-Willis without stenosis, dissection or occlusion. VERTEBRAL ARTERIES: Both of the vertebral arteries arise from the subclavian arteries. Right vertebral artery dominant, with a diffusely hypoplastic left vertebral artery. Vertebral arteries diffusely tortuous but widely patent within the neck without stenosis or other acute vascular abnormality. Hypoplastic left vertebral artery likely terminates in PICA. IMPRESSION: 1. Negative MRA of the neck with no hemodynamically significant or critical flow limiting stenosis identified. 2. Diffuse tortuosity of the major arterial vasculature of the neck, suggesting chronic underlying hypertension. 3. 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 Electronically Signed   By: Rise MuBenjamin  McClintock M.D.   On: 09/05/2019 06:25   Mr Brain Wo Contrast  Result Date: 09/05/2019 CLINICAL DATA:  Initial evaluation for acute right-sided numbness and tingling. EXAM: MRI HEAD WITHOUT CONTRAST TECHNIQUE: Multiplanar, multiecho pulse sequences of the brain and surrounding structures were obtained without intravenous contrast. COMPARISON:  Prior CT from 09/04/2019. FINDINGS: Brain: Cerebral volume within normal limits for age. Mild chronic microvascular ischemic changes noted within the periventricular deep white matter both cerebral hemispheres. 16 mm acute ischemic nonhemorrhagic infarct noted at the posterior left corona radiata (series 5, image 30). No associated mass effect. No other evidence for acute or subacute ischemia. Gray-white matter differentiation otherwise maintained. No encephalomalacia to suggest chronic cortical infarction elsewhere within the brain. No foci of susceptibility artifact to suggest acute or chronic intracranial hemorrhage. No mass lesion, midline shift or mass effect. No hydrocephalus. No extra-axial fluid collection. Pituitary gland suprasellar region normal. Midline structures intact. Vascular:  Major intracranial vascular flow voids are maintained. Skull and upper cervical spine: Craniocervical junction within normal limits. Bone marrow signal intensity normal. No scalp soft tissue abnormality. Sinuses/Orbits: Patient status post bilateral ocular lens replacement. Globes orbital soft tissues demonstrate no acute finding. Paranasal sinuses are largely clear. No significant mastoid effusion. Inner ear structures normal. Other: None. IMPRESSION: 1. 16 mm acute ischemic nonhemorrhagic infarct involving the posterior left corona radiata. 2. Underlying mild chronic microvascular ischemic disease for age. Electronically Signed   By: Rise MuBenjamin  McClintock M.D.   On: 09/05/2019 06:20     Assessment/Plan:  68 y.o. female with past medical history of depression and anxiety, GERD, DVT, peritonitis, hypertension, hypothyroidism, and rheumatoid arthritis presenting to the ED with chief complaints of numbness and tingling in her right hand and right leg, and right leg weakness. Pt last known well night prior to evaluation/admission.  Symptoms improved but still has numbness on the RUE.  She has L corona radiata stroke.  No anti platelet therapy prior to admission.    - Pt/ot - stroke in setting of small vessel disease and HTN - No prior ASA. ASA 81 mg daily on d/c  - Follow up neurology as out patient -  can likely be discharged today from neurological perspective.  09/05/2019, 10:41 AM

## 2019-09-05 NOTE — Progress Notes (Signed)
*  PRELIMINARY RESULTS* Echocardiogram 2D Echocardiogram has been performed.  [place] 09/05/2019, 11:57 AM

## 2019-09-06 ENCOUNTER — Other Ambulatory Visit: Payer: Self-pay | Admitting: Internal Medicine

## 2019-09-07 ENCOUNTER — Telehealth: Payer: Self-pay

## 2019-09-07 DIAGNOSIS — M06032 Rheumatoid arthritis without rheumatoid factor, left wrist: Secondary | ICD-10-CM | POA: Diagnosis not present

## 2019-09-07 DIAGNOSIS — E039 Hypothyroidism, unspecified: Secondary | ICD-10-CM | POA: Diagnosis not present

## 2019-09-07 DIAGNOSIS — M06042 Rheumatoid arthritis without rheumatoid factor, left hand: Secondary | ICD-10-CM | POA: Diagnosis not present

## 2019-09-07 DIAGNOSIS — M06031 Rheumatoid arthritis without rheumatoid factor, right wrist: Secondary | ICD-10-CM | POA: Diagnosis not present

## 2019-09-07 DIAGNOSIS — M06041 Rheumatoid arthritis without rheumatoid factor, right hand: Secondary | ICD-10-CM | POA: Diagnosis not present

## 2019-09-07 DIAGNOSIS — I69351 Hemiplegia and hemiparesis following cerebral infarction affecting right dominant side: Secondary | ICD-10-CM | POA: Diagnosis not present

## 2019-09-07 DIAGNOSIS — I739 Peripheral vascular disease, unspecified: Secondary | ICD-10-CM | POA: Diagnosis not present

## 2019-09-07 DIAGNOSIS — I10 Essential (primary) hypertension: Secondary | ICD-10-CM | POA: Diagnosis not present

## 2019-09-07 DIAGNOSIS — R32 Unspecified urinary incontinence: Secondary | ICD-10-CM | POA: Diagnosis not present

## 2019-09-07 LAB — ECHOCARDIOGRAM COMPLETE
Height: 66 in
Weight: 3200 oz

## 2019-09-07 LAB — HIV ANTIBODY (ROUTINE TESTING W REFLEX): HIV Screen 4th Generation wRfx: NONREACTIVE

## 2019-09-07 NOTE — Telephone Encounter (Signed)
Patient states she was told to follow up with a Neurologist at her hospital discharge. The hospital recommended [facility]. Patient wants to know who Dr. Rosanna Randy recommends.

## 2019-09-07 NOTE — Telephone Encounter (Signed)
Needs verbal orders for PT   2 times a week for four weeks.    Contact Aldrin with [facility]: [phone]  (It is a secured voicemail so it is okay to leave a message.)  Thanks,   -Mickel Baas

## 2019-09-07 NOTE — Telephone Encounter (Signed)
Ok to order 

## 2019-09-07 NOTE — Telephone Encounter (Signed)
No HFU scheduled.  

## 2019-09-07 NOTE — Telephone Encounter (Signed)
Transition Care Management Follow-up Telephone Call  Date of discharge and from where: [facility] on 09/05/19.  How have you been since you were released from the hospital? Doing better, still has tingling and numbness in the right arm and hand. Pt states it is light though. Leg weakness is about the same but PT is coming out to home today. Headache diminished after 3 days. Declined dizziness,  SOB, pain, fever or n/v/d.  Any questions or concerns? No   Items Reviewed:  Did the pt receive and understand the discharge instructions provided? Yes   Medications obtained and verified? Declined reviewing meds at this time. Did verify new meds prescribed (Lipitor and Aspirin).  Any new allergies since your discharge? No   Dietary orders reviewed? Yes  Do you have support at home? No, pt lives at home by herself but has her sister coming to home to assist with chores.   Other (ie: DME, Home Health, etc) PT is coming out to home today.   Functional Questionnaire: (I = Independent and D = Dependent)  Bathing/Dressing- I   Meal Prep- I  Eating- I  Maintaining continence- I  Transferring/Ambulation- I, has a walker and cane to use as needed. Not currently using.  Managing Meds- I   Follow up appointments reviewed:    PCP Hospital f/u appt confirmed? Yes  Scheduled to see Dr Rosanna Randy on 09/08/19 @ 10:20 AM.  [facility] f/u appt confirmed? Yes    Are transportation arrangements needed? No   If their condition worsens, is the pt aware to call  their PCP or go to the ED? Yes  Was the patient provided with contact information for the PCP's office or ED? Yes  Was the pt encouraged to call back with questions or concerns? Yes

## 2019-09-07 NOTE — Telephone Encounter (Signed)
Please review. Thanks!  

## 2019-09-08 ENCOUNTER — Encounter: Payer: Self-pay | Admitting: Family Medicine

## 2019-09-08 ENCOUNTER — Ambulatory Visit (INDEPENDENT_AMBULATORY_CARE_PROVIDER_SITE_OTHER): Payer: Medicare PPO | Admitting: Family Medicine

## 2019-09-08 ENCOUNTER — Other Ambulatory Visit: Payer: Self-pay

## 2019-09-08 VITALS — BP 118/76 | HR 100 | Temp 98.5°F | Resp 16 | Wt 196.0 lb

## 2019-09-08 DIAGNOSIS — M0579 Rheumatoid arthritis with rheumatoid factor of multiple sites without organ or systems involvement: Secondary | ICD-10-CM

## 2019-09-08 DIAGNOSIS — I639 Cerebral infarction, unspecified: Secondary | ICD-10-CM | POA: Diagnosis not present

## 2019-09-08 DIAGNOSIS — E7800 Pure hypercholesterolemia, unspecified: Secondary | ICD-10-CM

## 2019-09-08 DIAGNOSIS — F324 Major depressive disorder, single episode, in partial remission: Secondary | ICD-10-CM | POA: Diagnosis not present

## 2019-09-08 DIAGNOSIS — I10 Essential (primary) hypertension: Secondary | ICD-10-CM | POA: Diagnosis not present

## 2019-09-08 NOTE — Telephone Encounter (Signed)
Verbal orders left on secured voicemail.

## 2019-09-08 NOTE — Progress Notes (Signed)
Patient: Angela Sherman Female    DOB: [birth year]   68 y.o.   MRN: [id number] Visit Date: 09/08/2019  Today's Provider: Megan Mans, MD   Chief Complaint  Patient presents with  . Hospitalization Follow-up   Subjective:   HPI  Follow up Hospitalization Transition of care visit. Patient was admitted to [facility] on 09/04/2019 and discharged on 09/05/2019. She was treated for CVA.She still has right arm and right leg weakness. Treatment for this included starting on aspirin 81mg  daily and atorvastatin. Telephone follow up was done on 09/07/2019.  She reports good compliance with treatment. She reports this condition is Improved.   Allergies  Allergen Reactions  . Remicade [Infliximab] Anaphylaxis  . Shellfish-Derived Products Rash and Swelling  . Ciprofloxacin Other (See Comments)    Unknown  . Demerol [Meperidine] Nausea And Vomiting  . Nsaids Other (See Comments)    Unknown  . Tolmetin Other (See Comments)    Unknown  . Aspirin Rash    Patient states she is able to tolerate aspirin  . Clinoril [Sulindac] Rash  . Codeine Nausea And Vomiting and Other (See Comments)    Severe vomiting  . Methotrexate Nausea And Vomiting and Rash  . Methotrexate Derivatives Rash and Cough  . Penicillins Rash and Other (See Comments)    Has patient had a PCN reaction causing immediate rash, facial/tongue/throat swelling, SOB or lightheadedness with hypotension: Unknown Has patient had a PCN reaction causing severe rash involving mucus membranes or skin necrosis: No Has patient had a PCN reaction that required hospitalization: No Has patient had a PCN reaction occurring within the last 10 years: No If all of the above answers are "NO", then may proceed with Cephalosporin use.   . Shellfish Allergy Swelling and Rash     Current Outpatient Medications:  .  aspirin EC 81 MG EC tablet, Take 1 tablet (81 mg total) by mouth daily., Disp: 30 tablet, Rfl: 0 .  atorvastatin (LIPITOR) 40 MG  tablet, Take 1 tablet (40 mg total) by mouth daily at 6 PM., Disp: 30 tablet, Rfl: 0 .  cholecalciferol (VITAMIN D) 1000 units tablet, Take 1,000 Units by mouth daily. , Disp: , Rfl:  .  DULoxetine (CYMBALTA) 30 MG capsule, TAKE 1 CAPSULE(30 MG) BY MOUTH DAILY, Disp: 30 capsule, Rfl: 1 .  levothyroxine (SYNTHROID) 75 MCG tablet, TAKE 1 TABLET EVERY DAY BEFORE BREAKFAST, Disp: 90 tablet, Rfl: 3 .  omeprazole (PRILOSEC) 20 MG capsule, TAKE 1 CAPSULE(20 MG) BY MOUTH DAILY, Disp: 30 capsule, Rfl: 12 .  predniSONE (DELTASONE) 5 MG tablet, Take 5-10 mg by mouth daily with breakfast. , Disp: , Rfl:  .  triamterene-hydrochlorothiazide (MAXZIDE-25) 37.5-25 MG tablet, TAKE 1 TABLET EVERY DAY, Disp: 90 tablet, Rfl: 3  Review of Systems  Constitutional: Negative for activity change and fatigue.  Eyes: Negative.   Respiratory: Negative for cough and shortness of breath.   Cardiovascular: Negative for chest pain, palpitations and leg swelling.  Gastrointestinal: Negative.   Endocrine: Negative.   Musculoskeletal: Positive for arthralgias.  Allergic/Immunologic: Negative.   Neurological: Negative for dizziness, speech difficulty, weakness, light-headedness, numbness and headaches.  Psychiatric/Behavioral: Negative for agitation, self-injury, sleep disturbance and suicidal ideas. The patient is not nervous/anxious.     Social History   Tobacco Use  . Smoking status: Former Smoker    Types: Cigarettes  . Smokeless tobacco: Never Used  Substance Use Topics  . Alcohol use: Yes    Comment: Occasional, maybe 1-2  a month      Objective:   BP 118/76   Pulse 100   Temp 98.5 F (36.9 C)   Resp 16   Wt 196 lb (88.9 kg)   SpO2 96%   BMI 31.64 kg/m  Vitals:   09/08/19 1048  BP: 118/76  Pulse: 100  Resp: 16  Temp: 98.5 F (36.9 C)  SpO2: 96%  Weight: 196 lb (88.9 kg)  Body mass index is 31.64 kg/m.   Physical Exam Vitals signs reviewed.  Constitutional:      Appearance: She is  well-developed.  HENT:     Head: Normocephalic and atraumatic.     Right Ear: External ear normal.     Left Ear: External ear normal.     Nose: Nose normal.  Eyes:     General: No scleral icterus.    Conjunctiva/sclera: Conjunctivae normal.  Neck:     Thyroid: No thyromegaly.  Cardiovascular:     Rate and Rhythm: Normal rate and regular rhythm.     Heart sounds: Normal heart sounds.  Pulmonary:     Effort: Pulmonary effort is normal.     Breath sounds: Normal breath sounds.  Abdominal:     Palpations: Abdomen is soft.  Skin:    General: Skin is warm and dry.  Neurological:     Mental Status: She is alert and oriented to person, place, and time.     Comments: Mild weakness or right hand,arma dn leg. Pt able to ambulate.  Psychiatric:        Behavior: Behavior normal.        Thought Content: Thought content normal.        Judgment: Judgment normal.      No results found for any visits on 09/08/19.     Assessment & Plan    1. Cerebrovascular accident (CVA), unspecified mechanism ([place]) Refer to Neurology. W/u in hospital negative. - Ambulatory referral to Neurology  2. Essential (primary) hypertension   3. Rheumatoid arthritis involving multiple sites with positive rheumatoid factor (HCC) Presently stable.  4. Major depressive disorder in partial remission, unspecified whether recurrent ([place])   5. Hypercholesteremia Now on lipitor.RTC 1 month--recheck lipids then.    I, Oxbow Presley, CMA, am acting as a Education administrator for [employer]. Rosanna Randy, [place]   Wilhemena Durie, MD  [facility]

## 2019-09-13 DIAGNOSIS — M06042 Rheumatoid arthritis without rheumatoid factor, left hand: Secondary | ICD-10-CM | POA: Diagnosis not present

## 2019-09-13 DIAGNOSIS — E039 Hypothyroidism, unspecified: Secondary | ICD-10-CM | POA: Diagnosis not present

## 2019-09-13 DIAGNOSIS — I69351 Hemiplegia and hemiparesis following cerebral infarction affecting right dominant side: Secondary | ICD-10-CM | POA: Diagnosis not present

## 2019-09-13 DIAGNOSIS — M06032 Rheumatoid arthritis without rheumatoid factor, left wrist: Secondary | ICD-10-CM | POA: Diagnosis not present

## 2019-09-13 DIAGNOSIS — I739 Peripheral vascular disease, unspecified: Secondary | ICD-10-CM | POA: Diagnosis not present

## 2019-09-13 DIAGNOSIS — M06041 Rheumatoid arthritis without rheumatoid factor, right hand: Secondary | ICD-10-CM | POA: Diagnosis not present

## 2019-09-13 DIAGNOSIS — M06031 Rheumatoid arthritis without rheumatoid factor, right wrist: Secondary | ICD-10-CM | POA: Diagnosis not present

## 2019-09-13 DIAGNOSIS — I10 Essential (primary) hypertension: Secondary | ICD-10-CM | POA: Diagnosis not present

## 2019-09-13 DIAGNOSIS — R32 Unspecified urinary incontinence: Secondary | ICD-10-CM | POA: Diagnosis not present

## 2019-09-14 DIAGNOSIS — I739 Peripheral vascular disease, unspecified: Secondary | ICD-10-CM | POA: Diagnosis not present

## 2019-09-14 DIAGNOSIS — M06032 Rheumatoid arthritis without rheumatoid factor, left wrist: Secondary | ICD-10-CM | POA: Diagnosis not present

## 2019-09-14 DIAGNOSIS — M06031 Rheumatoid arthritis without rheumatoid factor, right wrist: Secondary | ICD-10-CM | POA: Diagnosis not present

## 2019-09-14 DIAGNOSIS — R32 Unspecified urinary incontinence: Secondary | ICD-10-CM | POA: Diagnosis not present

## 2019-09-14 DIAGNOSIS — E039 Hypothyroidism, unspecified: Secondary | ICD-10-CM | POA: Diagnosis not present

## 2019-09-14 DIAGNOSIS — M06041 Rheumatoid arthritis without rheumatoid factor, right hand: Secondary | ICD-10-CM | POA: Diagnosis not present

## 2019-09-14 DIAGNOSIS — M06042 Rheumatoid arthritis without rheumatoid factor, left hand: Secondary | ICD-10-CM | POA: Diagnosis not present

## 2019-09-14 DIAGNOSIS — I10 Essential (primary) hypertension: Secondary | ICD-10-CM | POA: Diagnosis not present

## 2019-09-14 DIAGNOSIS — I69351 Hemiplegia and hemiparesis following cerebral infarction affecting right dominant side: Secondary | ICD-10-CM | POA: Diagnosis not present

## 2019-09-16 DIAGNOSIS — I739 Peripheral vascular disease, unspecified: Secondary | ICD-10-CM | POA: Diagnosis not present

## 2019-09-16 DIAGNOSIS — M06042 Rheumatoid arthritis without rheumatoid factor, left hand: Secondary | ICD-10-CM | POA: Diagnosis not present

## 2019-09-16 DIAGNOSIS — M06032 Rheumatoid arthritis without rheumatoid factor, left wrist: Secondary | ICD-10-CM | POA: Diagnosis not present

## 2019-09-16 DIAGNOSIS — M06041 Rheumatoid arthritis without rheumatoid factor, right hand: Secondary | ICD-10-CM | POA: Diagnosis not present

## 2019-09-16 DIAGNOSIS — I10 Essential (primary) hypertension: Secondary | ICD-10-CM | POA: Diagnosis not present

## 2019-09-16 DIAGNOSIS — R32 Unspecified urinary incontinence: Secondary | ICD-10-CM | POA: Diagnosis not present

## 2019-09-16 DIAGNOSIS — M06031 Rheumatoid arthritis without rheumatoid factor, right wrist: Secondary | ICD-10-CM | POA: Diagnosis not present

## 2019-09-16 DIAGNOSIS — I69351 Hemiplegia and hemiparesis following cerebral infarction affecting right dominant side: Secondary | ICD-10-CM | POA: Diagnosis not present

## 2019-09-16 DIAGNOSIS — E039 Hypothyroidism, unspecified: Secondary | ICD-10-CM | POA: Diagnosis not present

## 2019-09-20 ENCOUNTER — Telehealth: Payer: Self-pay | Admitting: Family Medicine

## 2019-09-20 DIAGNOSIS — E039 Hypothyroidism, unspecified: Secondary | ICD-10-CM | POA: Diagnosis not present

## 2019-09-20 DIAGNOSIS — M06042 Rheumatoid arthritis without rheumatoid factor, left hand: Secondary | ICD-10-CM | POA: Diagnosis not present

## 2019-09-20 DIAGNOSIS — I10 Essential (primary) hypertension: Secondary | ICD-10-CM | POA: Diagnosis not present

## 2019-09-20 DIAGNOSIS — M06041 Rheumatoid arthritis without rheumatoid factor, right hand: Secondary | ICD-10-CM | POA: Diagnosis not present

## 2019-09-20 DIAGNOSIS — M06031 Rheumatoid arthritis without rheumatoid factor, right wrist: Secondary | ICD-10-CM | POA: Diagnosis not present

## 2019-09-20 DIAGNOSIS — I739 Peripheral vascular disease, unspecified: Secondary | ICD-10-CM | POA: Diagnosis not present

## 2019-09-20 DIAGNOSIS — R32 Unspecified urinary incontinence: Secondary | ICD-10-CM | POA: Diagnosis not present

## 2019-09-20 DIAGNOSIS — I69351 Hemiplegia and hemiparesis following cerebral infarction affecting right dominant side: Secondary | ICD-10-CM | POA: Diagnosis not present

## 2019-09-20 DIAGNOSIS — M06032 Rheumatoid arthritis without rheumatoid factor, left wrist: Secondary | ICD-10-CM | POA: Diagnosis not present

## 2019-09-20 NOTE — Telephone Encounter (Signed)
Please review. Thanks!  

## 2019-09-20 NOTE — Telephone Encounter (Signed)
Patient notified

## 2019-09-20 NOTE — Telephone Encounter (Signed)
Just stop it.

## 2019-09-20 NOTE — Telephone Encounter (Signed)
Pt started Lipitor about a  Week ago and is having a ras breakout all over.  None swelling of her tongue or difficulty swallowing  CB#  [phone]  [place]

## 2019-10-03 ENCOUNTER — Other Ambulatory Visit: Payer: Self-pay | Admitting: Internal Medicine

## 2019-10-06 ENCOUNTER — Other Ambulatory Visit: Payer: Self-pay

## 2019-10-06 ENCOUNTER — Encounter: Payer: Self-pay | Admitting: Family Medicine

## 2019-10-06 ENCOUNTER — Ambulatory Visit (INDEPENDENT_AMBULATORY_CARE_PROVIDER_SITE_OTHER): Payer: Medicare PPO | Admitting: Family Medicine

## 2019-10-06 VITALS — BP 113/83 | HR 82 | Temp 96.9°F | Resp 18 | Wt 196.6 lb

## 2019-10-06 DIAGNOSIS — Z23 Encounter for immunization: Secondary | ICD-10-CM

## 2019-10-06 DIAGNOSIS — I639 Cerebral infarction, unspecified: Secondary | ICD-10-CM

## 2019-10-06 DIAGNOSIS — E039 Hypothyroidism, unspecified: Secondary | ICD-10-CM | POA: Diagnosis not present

## 2019-10-06 DIAGNOSIS — M0579 Rheumatoid arthritis with rheumatoid factor of multiple sites without organ or systems involvement: Secondary | ICD-10-CM | POA: Diagnosis not present

## 2019-10-06 DIAGNOSIS — E7800 Pure hypercholesterolemia, unspecified: Secondary | ICD-10-CM | POA: Diagnosis not present

## 2019-10-06 DIAGNOSIS — I10 Essential (primary) hypertension: Secondary | ICD-10-CM

## 2019-10-06 NOTE — Progress Notes (Signed)
Patient: Angela Sherman Female    DOB: [birth year]   68 y.o.   MRN: [id number] Visit Date: 10/06/2019  Today's Provider: Megan Mans, MD   Chief Complaint  Patient presents with  . Follow-up  . Hyperlipidemia   Subjective:     HPI 1 month follow up for hypercholesteremia. Patient states that she had rash and body cramps when she took the Lipitor.    Allergies  Allergen Reactions  . Remicade [Infliximab] Anaphylaxis  . Shellfish-Derived Products Rash and Swelling  . Ciprofloxacin Other (See Comments)    Unknown  . Demerol [Meperidine] Nausea And Vomiting  . Nsaids Other (See Comments)    Unknown  . Tolmetin Other (See Comments)    Unknown  . Aspirin Rash    Patient states she is able to tolerate aspirin  . Clinoril [Sulindac] Rash  . Codeine Nausea And Vomiting and Other (See Comments)    Severe vomiting  . Methotrexate Nausea And Vomiting and Rash  . Methotrexate Derivatives Rash and Cough  . Penicillins Rash and Other (See Comments)    Has patient had a PCN reaction causing immediate rash, facial/tongue/throat swelling, SOB or lightheadedness with hypotension: Unknown Has patient had a PCN reaction causing severe rash involving mucus membranes or skin necrosis: No Has patient had a PCN reaction that required hospitalization: No Has patient had a PCN reaction occurring within the last 10 years: No If all of the above answers are "NO", then may proceed with Cephalosporin use.   . Shellfish Allergy Swelling and Rash     Current Outpatient Medications:  .  aspirin EC 81 MG EC tablet, Take 1 tablet (81 mg total) by mouth daily., Disp: 30 tablet, Rfl: 0 .  atorvastatin (LIPITOR) 40 MG tablet, Take 1 tablet (40 mg total) by mouth daily at 6 PM., Disp: 30 tablet, Rfl: 0 .  cholecalciferol (VITAMIN D) 1000 units tablet, Take 1,000 Units by mouth daily. , Disp: , Rfl:  .  DULoxetine (CYMBALTA) 30 MG capsule, TAKE 1 CAPSULE(30 MG) BY MOUTH DAILY, Disp: 30 capsule,  Rfl: 1 .  levothyroxine (SYNTHROID) 75 MCG tablet, TAKE 1 TABLET EVERY DAY BEFORE BREAKFAST, Disp: 90 tablet, Rfl: 3 .  omeprazole (PRILOSEC) 20 MG capsule, TAKE 1 CAPSULE(20 MG) BY MOUTH DAILY, Disp: 30 capsule, Rfl: 12 .  predniSONE (DELTASONE) 5 MG tablet, Take 5-10 mg by mouth daily with breakfast. , Disp: , Rfl:  .  triamterene-hydrochlorothiazide (MAXZIDE-25) 37.5-25 MG tablet, TAKE 1 TABLET EVERY DAY, Disp: 90 tablet, Rfl: 3  Review of Systems  Constitutional: Negative for activity change, appetite change, chills, diaphoresis, fatigue, fever and unexpected weight change.  Eyes: Negative.   Respiratory: Negative for cough and shortness of breath.   Cardiovascular: Negative for chest pain, palpitations and leg swelling.  Gastrointestinal: Negative.   Endocrine: Negative for cold intolerance, heat intolerance, polydipsia and polyuria.  Musculoskeletal: Positive for arthralgias and myalgias.  Skin: Negative for color change, pallor, rash and wound.  Allergic/Immunologic: Negative.   Neurological: Negative for dizziness and headaches.  Psychiatric/Behavioral: Negative.     Social History   Tobacco Use  . Smoking status: Former Smoker    Types: Cigarettes  . Smokeless tobacco: Never Used  Substance Use Topics  . Alcohol use: Yes    Comment: Occasional, maybe 1-2 a month      Objective:   BP 113/83 (BP Location: Right Arm, Patient Position: Sitting, Cuff Size: Normal)   Pulse 82   Temp [name])  96.9 F (36.1 C) (Temporal)   Resp 18   Wt 196 lb 9.6 oz (89.2 kg)   SpO2 98%   BMI 31.73 kg/m  Vitals:   10/06/19 0822  BP: 113/83  Pulse: 82  Resp: 18  Temp: (!) 96.9 F (36.1 C)  TempSrc: Temporal  SpO2: 98%  Weight: 196 lb 9.6 oz (89.2 kg)  Body mass index is 31.73 kg/m.   Physical Exam Vitals signs reviewed.  Constitutional:      Appearance: She is well-developed.  HENT:     Head: Normocephalic and atraumatic.     Right Ear: External ear normal.     Left Ear:  External ear normal.     Nose: Nose normal.  Eyes:     General: No scleral icterus.    Conjunctiva/sclera: Conjunctivae normal.  Neck:     Thyroid: No thyromegaly.  Cardiovascular:     Rate and Rhythm: Normal rate and regular rhythm.     Heart sounds: Normal heart sounds.  Pulmonary:     Effort: Pulmonary effort is normal.     Breath sounds: Normal breath sounds.  Abdominal:     Palpations: Abdomen is soft.  Musculoskeletal:     Comments: Obvious RA of hands.  Skin:    General: Skin is warm and dry.  Neurological:     Mental Status: She is alert and oriented to person, place, and time.     Comments: Mild weakness or right hand,arma dn leg. Pt able to ambulate.  Psychiatric:        Mood and Affect: Mood normal.        Behavior: Behavior normal.        Thought Content: Thought content normal.        Judgment: Judgment normal.      No results found for any visits on 10/06/19.     Assessment & Plan    1. Need for immunization against influenza  - Flu Vaccine QUAD 36+ mos IM  2. Cerebrovascular accident (CVA), unspecified mechanism ([place]) All risk factors discussed with pt.  3. Essential (primary) hypertension On Maxzide.  4. Adult hypothyroidism   5. Rheumatoid arthritis involving multiple sites with positive rheumatoid factor ([place]) Per [facility].  6. Hypercholesteremia Pt unable to tolerate Lipitor--recheck labs next visit with dietary changes.     Richard Cranford Mon, MD  [facility]

## 2019-10-08 ENCOUNTER — Other Ambulatory Visit: Payer: Self-pay | Admitting: Family Medicine

## 2019-10-20 ENCOUNTER — Encounter: Payer: Self-pay | Admitting: Neurology

## 2019-10-20 ENCOUNTER — Ambulatory Visit: Payer: Medicare PPO | Admitting: Neurology

## 2019-10-20 ENCOUNTER — Other Ambulatory Visit: Payer: Self-pay

## 2019-10-20 VITALS — BP 126/86 | HR 104 | Temp 97.0°F | Ht 66.0 in | Wt 192.4 lb

## 2019-10-20 DIAGNOSIS — R29898 Other symptoms and signs involving the musculoskeletal system: Secondary | ICD-10-CM | POA: Diagnosis not present

## 2019-10-20 DIAGNOSIS — I6381 Other cerebral infarction due to occlusion or stenosis of small artery: Secondary | ICD-10-CM | POA: Diagnosis not present

## 2019-10-20 NOTE — Progress Notes (Signed)
[facility] [address] [place]. [place] [address] [phone]       OFFICE CONSULT NOTE  Angela Sherman Date of Birth:  1951-10-12 Medical Record Number:  [id number]   Referring MD: Debbe Odea  Reason for Referral: Stroke  HPI: Angela Sherman is a pleasant 68 year old Caucasian lady seen today for initial office consultation visit for stroke.  History is obtained from the patient and review of electronic medical records and I personally reviewed imaging films in PACS.  She is a 68 year old lady with past medical history of GERD, depression, DVT, peritonitis, hypertension, hypothyroidism, rheumatoid arthritis who presented to [facility] emergency room on 09/05/2019 with sudden onset of numbness and tingling in the right hand as well as right foot and some right leg weakness.  CT scan of the head was unremarkable but MRI scan showed 1.4 cm left coronary data lacunar infarct.  MRI of the neck showed no significant extracranial stenosis.  No intracranial vascular imaging was done.  Transthoracic echo showed normal ejection fraction without cardiac source of embolism.  Telemetry monitoring did not reveal cardiac arrhythmias.  LDL cholesterol was elevated at 1 1 7  mg percent.  Hemoglobin A1c was 5.5.  Patient was started on aspirin as well as Lipitor.  She developed a rash within a week after Lipitor and it has since been discontinued.  She is doing a low-fat diet and plans to repeat her lipid profile in December with her primary physician.  She got home physical and occupational therapy and has been discharged and has done well.  She has no significant vascular risk factors except mild obesity and hyperlipidemia.  Her blood pressures well controlled today it is 126/86.  Patient had remote history of DVT years ago but no further episodes.  She has chronic shoulder pain for which she sees a January.  No other active new complaints.  She is tolerating aspirin  well without bruising or bleeding.  ROS:   14 system review of systems is positive for numbness, weakness, imbalance, arthritis and all other systems negative  PMH:  Past Medical History:  Diagnosis Date   Arthritis    RA   Cough    CHRONIC   Depression    GERD (gastroesophageal reflux disease)    Headache    H/O MIGRAINES   History of deep vein thrombosis    Right calf   History of peritonitis 2002?   Right calf.   Hypertension    Hypothyroidism    PONV (postoperative nausea and vomiting)    NO PROBLEM WITH CATARACT SURGERY   Thyroid disease     Social History:  Social History   Socioeconomic History   Marital status: Single    Spouse name: Not on file   Number of children: 0   Years of education: 3 years college   Highest education level: Not on file  Occupational History   Occupation: Retired  2003 strain: Not on file   Food insecurity    Worry: Not on file    Inability: Not on Ecologist needs    Medical: Not on file    Non-medical: Not on file  Tobacco Use   Smoking status: Former Smoker    Types: Cigarettes    Quit date: 1980    Years since quitting: 40.8   Smokeless tobacco: Never Used  Substance and Sexual Activity   Alcohol use: Yes    Comment: Occasional, maybe 1-2  a month   Drug use: No   Sexual activity: Never  Lifestyle   Physical activity    Days per week: Not on file    Minutes per session: Not on file   Stress: Not on file  Relationships   Social connections    Talks on phone: Not on file    Gets together: Not on file    Attends religious service: Not on file    Active member of club or organization: Not on file    Attends meetings of clubs or organizations: Not on file    Relationship status: Not on file   Intimate partner violence    Fear of current or ex partner: Not on file    Emotionally abused: Not on file    Physically abused: Not on file    Forced  sexual activity: Not on file  Other Topics Concern   Not on file  Social History Narrative   Lives at home alone.   Right-handed.   Two cups caffeine per day.    Medications:   Current Outpatient Medications on File Prior to Visit  Medication Sig Dispense Refill   aspirin EC 81 MG EC tablet Take 1 tablet (81 mg total) by mouth daily. 30 tablet 0   cholecalciferol (VITAMIN D) 1000 units tablet Take 1,000 Units by mouth daily.      DULoxetine (CYMBALTA) 30 MG capsule TAKE 1 CAPSULE(30 MG) BY MOUTH DAILY 30 capsule 12   levothyroxine (SYNTHROID) 75 MCG tablet TAKE 1 TABLET EVERY DAY BEFORE BREAKFAST 90 tablet 3   omeprazole (PRILOSEC) 20 MG capsule TAKE 1 CAPSULE(20 MG) BY MOUTH DAILY 30 capsule 12   predniSONE (DELTASONE) 5 MG tablet Take 5-10 mg by mouth daily with breakfast.      triamterene-hydrochlorothiazide (MAXZIDE-25) 37.5-25 MG tablet TAKE 1 TABLET EVERY DAY 90 tablet 3   No current facility-administered medications on file prior to visit.     Allergies:   Allergies  Allergen Reactions   Remicade [Infliximab] Anaphylaxis   Shellfish-Derived Products Rash and Swelling   Ciprofloxacin Other (See Comments)    Unknown   Demerol [Meperidine] Nausea And Vomiting   Nsaids Other (See Comments)    Unknown   Tolmetin Other (See Comments)    Unknown   Aspirin Rash    Patient states she is able to tolerate aspirin   Clinoril [Sulindac] Rash   Codeine Nausea And Vomiting and Other (See Comments)    Severe vomiting   Lipitor [Atorvastatin Calcium] Rash   Methotrexate Nausea And Vomiting and Rash   Methotrexate Derivatives Rash and Cough   Penicillins Rash and Other (See Comments)    Has patient had a PCN reaction causing immediate rash, facial/tongue/throat swelling, SOB or lightheadedness with hypotension: Unknown Has patient had a PCN reaction causing severe rash involving mucus membranes or skin necrosis: No Has patient had a PCN reaction that required  hospitalization: No Has patient had a PCN reaction occurring within the last 10 years: No If all of the above answers are "NO", then may proceed with Cephalosporin use.    Shellfish Allergy Swelling and Rash    Physical Exam General: Obese middle-aged Caucasian lady seated, in no evident distress Head: head normocephalic and atraumatic.   Neck: supple with no carotid or supraclavicular bruits Cardiovascular: regular rate and rhythm, no murmurs Musculoskeletal: Bilateral hand metacarpal phalangeal joint deformities from rheumatoid arthritis Skin:  no rash/petichiae Vascular:  Normal pulses all extremities  Neurologic Exam Mental Status: Awake and fully  alert. Oriented to place and time. Recent and remote memory intact. Attention span, concentration and fund of knowledge appropriate. Mood and affect appropriate.  Cranial Nerves: Fundoscopic exam reveals sharp disc margins. Pupils equal, briskly reactive to light. Extraocular movements full without nystagmus. Visual fields full to confrontation. Hearing intact. Facial sensation intact.  Mild right lower facial asymmetry., tongue, palate moves normally and symmetrically.  Motor: Normal bulk and tone. Normal strength in all tested extremity muscles.  Diminished fine finger movements on the right.  Grips left over right upper extremity.  Mild right grip weakness. Sensory.: intact to touch , pinprick , position and vibratory sensation.  Coordination: Rapid alternating movements normal in all extremities. Finger-to-nose and heel-to-shin performed accurately bilaterally. Gait and Station: Arises from chair without difficulty. Stance is normal. Gait demonstrates normal stride length and balance . Able to heel, toe and tandem walk with slight difficulty.  Reflexes: 1+ and symmetric. Toes downgoing.   NIHSS  1 Modified Rankin 2   ASSESSMENT: 69 year Caucasian lady with left subcortical lacunar infarct in September 2020 due to small vessel disease.   Vascular risk factors of obesity, hyperlipidemia only     PLAN: I had a long d/w patient about her recent lacunar stroke, risk for recurrent stroke/TIAs, personally independently reviewed imaging studies and stroke evaluation results and answered questions.Continue aspirin 81 mg daily  for secondary stroke prevention and maintain strict control of hypertension with blood pressure goal below 130/90, diabetes with hemoglobin A1c goal below 6.5% and lipids with LDL cholesterol goal below 70 mg/dL.  Patient is allergic to Lipitor and developed a rash and is on strict low-fat diet.  Recommend repeat lipid profile in 2 months and if still not satisfactory may need to consider the new PCSK9 inhibitor injections.  Check transcranial Doppler  study as she has not had any intracranial vascular imaging done.  I also advised the patient to eat a healthy diet with plenty of whole grains, cereals, fruits and vegetables, exercise regularly and maintain ideal body weight .Greater than 50% time during this 45-minute visit was spent on counseling and coordination of care about her lacunar stroke and answering questions followup in the future with me in 2 months or call earlier if necessary. Antony Contras, MD  [facility] [address] [place], [place] [address]  Phone [phone] Fax [phone] Note: This document was prepared with digital dictation and possible smart phrase technology. Any transcriptional errors that result from this process are unintentional.

## 2019-10-20 NOTE — Patient Instructions (Addendum)
I had a long d/w patient about her recent lacunar stroke, risk for recurrent stroke/TIAs, personally independently reviewed imaging studies and stroke evaluation results and answered questions.Continue aspirin 81 mg daily  for secondary stroke prevention and maintain strict control of hypertension with blood pressure goal below 130/90, diabetes with hemoglobin A1c goal below 6.5% and lipids with LDL cholesterol goal below 70 mg/dL.  Patient is allergic to Lipitor and developed a rash and is on strict low-fat diet.  Recommend repeat lipid profile in 2 months and if still not satisfactory may need to consider the new PCSK9 inhibitor injections.  Check transcranial Doppler study as she has not had any intracranial vascular imaging done.  I also advised the patient to eat a healthy diet with plenty of whole grains, cereals, fruits and vegetables, exercise regularly and maintain ideal body weight Followup in the future with me in 2 months or call earlier if necessary.  Stroke Prevention Some medical conditions and behaviors are associated with a higher chance of having a stroke. You can help prevent a stroke by making nutrition, lifestyle, and other changes, including managing any medical conditions you may have. What nutrition changes can be made?   Eat healthy foods. You can do this by: ? Choosing foods high in fiber, such as fresh fruits and vegetables and whole grains. ? Eating at least 5 or more servings of fruits and vegetables a day. Try to fill half of your plate at each meal with fruits and vegetables. ? Choosing lean protein foods, such as lean cuts of meat, poultry without skin, fish, tofu, beans, and nuts. ? Eating low-fat dairy products. ? Avoiding foods that are high in salt (sodium). This can help lower blood pressure. ? Avoiding foods that have saturated fat, trans fat, and cholesterol. This can help prevent high cholesterol. ? Avoiding processed and premade foods.  Follow your health care  provider's specific guidelines for losing weight, controlling high blood pressure (hypertension), lowering high cholesterol, and managing diabetes. These may include: ? Reducing your daily calorie intake. ? Limiting your daily sodium intake to 1,500 milligrams (mg). ? Using only healthy fats for cooking, such as olive oil, canola oil, or sunflower oil. ? Counting your daily carbohydrate intake. What lifestyle changes can be made?  Maintain a healthy weight. Talk to your health care provider about your ideal weight.  Get at least 30 minutes of moderate physical activity at least 5 days a week. Moderate activity includes brisk walking, biking, and swimming.  Do not use any products that contain nicotine or tobacco, such as cigarettes and e-cigarettes. If you need help quitting, ask your health care provider. It may also be helpful to avoid exposure to secondhand smoke.  Limit alcohol intake to no more than 1 drink a day for nonpregnant women and 2 drinks a day for men. One drink equals 12 oz of beer, 5 oz of wine, or 1 oz of hard liquor.  Stop any illegal drug use.  Avoid taking birth control pills. Talk to your health care provider about the risks of taking birth control pills if: ? You are over 33 years old. ? You smoke. ? You get migraines. ? You have ever had a blood clot. What other changes can be made?  Manage your cholesterol levels. ? Eating a healthy diet is important for preventing high cholesterol. If cholesterol cannot be managed through diet alone, you may also need to take medicines. ? Take any prescribed medicines to control your cholesterol as told  by your health care provider.  Manage your diabetes. ? Eating a healthy diet and exercising regularly are important parts of managing your blood sugar. If your blood sugar cannot be managed through diet and exercise, you may need to take medicines. ? Take any prescribed medicines to control your diabetes as told by your health  care provider.  Control your hypertension. ? To reduce your risk of stroke, try to keep your blood pressure below 130/80. ? Eating a healthy diet and exercising regularly are an important part of controlling your blood pressure. If your blood pressure cannot be managed through diet and exercise, you may need to take medicines. ? Take any prescribed medicines to control hypertension as told by your health care provider. ? Ask your health care provider if you should monitor your blood pressure at home. ? Have your blood pressure checked every year, even if your blood pressure is normal. Blood pressure increases with age and some medical conditions.  Get evaluated for sleep disorders (sleep apnea). Talk to your health care provider about getting a sleep evaluation if you snore a lot or have excessive sleepiness.  Take over-the-counter and prescription medicines only as told by your health care provider. Aspirin or blood thinners (antiplatelets or anticoagulants) may be recommended to reduce your risk of forming blood clots that can lead to stroke.  Make sure that any other medical conditions you have, such as atrial fibrillation or atherosclerosis, are managed. What are the warning signs of a stroke? The warning signs of a stroke can be easily remembered as BEFAST.  B is for balance. Signs include: ? Dizziness. ? Loss of balance or coordination. ? Sudden trouble walking.  E is for eyes. Signs include: ? A sudden change in vision. ? Trouble seeing.  F is for face. Signs include: ? Sudden weakness or numbness of the face. ? The face or eyelid drooping to one side.  A is for arms. Signs include: ? Sudden weakness or numbness of the arm, usually on one side of the body.  S is for speech. Signs include: ? Trouble speaking (aphasia). ? Trouble understanding.  T is for time. ? These symptoms may represent a serious problem that is an emergency. Do not wait to see if the symptoms will go  away. Get medical help right away. Call your local emergency services (911 in the [place] Do not drive yourself to the hospital.  Other signs of stroke may include: ? A sudden, severe headache with no known cause. ? Nausea or vomiting. ? Seizure. Where to find more information For more information, visit:  American Stroke Association: www.strokeassociation.org  National Stroke Association: www.stroke.org Summary  You can prevent a stroke byur health care provider. It may also be helpful to avoid exposure to secondhand smoke. eating healthy, exercising, not smoking, limiting alcohol intake, and managing any medical conditions you may have.  Do not use any products that contain nicotine or tobacco, such as cigarettes and e-cigarettes. If you need help quitting, ask yo  Remember BEFAST for warning signs of stroke. Get help right away if you or a loved one has any of these signs. This information is not intended to replace advice given to you by your health care provider. Make sure you discuss any questions you have with your health care provider. Document Released: 01/23/2005 Document Revised: 11/28/2017 Document Reviewed: 01/21/2017 Elsevier Patient Education  2020 [employer].

## 2019-10-24 DIAGNOSIS — M0579 Rheumatoid arthritis with rheumatoid factor of multiple sites without organ or systems involvement: Principal | ICD-10-CM

## 2019-10-25 MED ORDER — PREDNISONE 5 MG TABLET
ORAL_TABLET | 3 refills | 0 days | Status: CP
Start: 2019-10-25 — End: ?

## 2019-10-25 NOTE — Unmapped (Signed)
Prednisone refill  Last ov: 01/05/2019   Next ov: 01/11/2020

## 2019-10-27 ENCOUNTER — Ambulatory Visit (HOSPITAL_COMMUNITY)
Admission: RE | Admit: 2019-10-27 | Discharge: 2019-10-27 | Disposition: A | Discharge status: Home / Self Care | Payer: Medicare PPO | Source: Ambulatory Visit | Attending: Neurology | Admitting: Neurology

## 2019-10-27 ENCOUNTER — Other Ambulatory Visit: Payer: Self-pay

## 2019-10-27 DIAGNOSIS — I6381 Other cerebral infarction due to occlusion or stenosis of small artery: Secondary | ICD-10-CM | POA: Insufficient documentation

## 2019-11-22 DIAGNOSIS — M9902 Segmental and somatic dysfunction of thoracic region: Secondary | ICD-10-CM | POA: Diagnosis not present

## 2019-11-22 DIAGNOSIS — M9901 Segmental and somatic dysfunction of cervical region: Secondary | ICD-10-CM | POA: Diagnosis not present

## 2019-11-22 DIAGNOSIS — M4604 Spinal enthesopathy, thoracic region: Secondary | ICD-10-CM | POA: Diagnosis not present

## 2019-11-22 DIAGNOSIS — M47892 Other spondylosis, cervical region: Secondary | ICD-10-CM | POA: Diagnosis not present

## 2019-11-23 ENCOUNTER — Telehealth: Payer: Self-pay | Admitting: Neurology

## 2019-11-23 NOTE — Telephone Encounter (Signed)
I called patient and LVM regarding r/s 12/14 appt d/t MD out. °

## 2019-11-29 ENCOUNTER — Other Ambulatory Visit: Payer: Self-pay | Admitting: Family Medicine

## 2019-11-29 MED ORDER — GABAPENTIN 300 MG PO CAPS: ORAL_CAPSULE | ORAL | 3 refills | Status: DC

## 2019-11-29 NOTE — Telephone Encounter (Signed)
From [facility] 

## 2019-11-29 NOTE — Telephone Encounter (Signed)
Please advise refill? Gabapentin was last refilled 06/18/2017. Patient has appt 12/06/2019.

## 2019-11-29 NOTE — Telephone Encounter (Signed)
Medication Refill - Medication: Gabapentin for shoulder pain.  States she has an upcoming appointment and doesn't want another but can't wait to get this medication.  Has the patient contacted their pharmacy? no (Agent: If no, request that the patient contact the pharmacy for the refill.) (Agent: If yes, when and what did the pharmacy advise?)  Preferred Pharmacy (with phone number or street name):  [id number] [address], [place] [phone] (Phone) [phone] (Fax)   Agent: Please be advised that RX refills may take up to 3 business days. We ask that you follow-up with your pharmacy. 3

## 2019-11-30 ENCOUNTER — Telehealth: Payer: Self-pay

## 2019-11-30 NOTE — Telephone Encounter (Signed)
Copied from [place] [id number]. Topic: Referral - Request for Referral >> Nov 30, 2019  4:19 PM Alanda Slim E wrote: Has patient seen PCP for this complaint? Yes  *If NO, is insurance requiring patient see PCP for this issue before PCP can refer them? Referral for which specialty: Chiropractor  Preferred provider/office: [facility]  Dr. Lodema Pilot for( [facility]) fax# [phone] Reason for referral: Right shoulder pain   Pt has an appt scheduled for 12.2.20 at 1pm and needs referral before then

## 2019-12-01 NOTE — Progress Notes (Signed)
Patient: Angela Sherman Female    DOB: 1951-07-15   68 y.o.   MRN: [id number] Visit Date: 12/06/2019  Today's Provider: Wilhemena Durie, MD   Chief Complaint  Patient presents with  . Follow-up  . Hypertension  . Hypothyroidism  . Hyperlipidemia   Subjective:     HPI  Since the stroke the patient has developed pain in her right arm from her shoulder down to her hand.  She states it was a 9/10 last week.  We sent in gabapentin 300 mg 3 times daily and is now down to a 4/10. Cerebrovascular accident (CVA), unspecified mechanism ([place]) From 10/06/2019-All risk factors discussed with pt.  Essential (primary) hypertension From 10/06/2019-On Maxzide.  Adult hypothyroidism From 08/27/2018-labs stable.  Rheumatoid arthritis involving multiple sites with positive rheumatoid factor ([place]) From 10/06/2019-Per [facility]. She has been on Humira, Enbrel, Remicade, and Orenci for her rheumatoid arthritis and is unable to afford or tolerate any of these Hypercholesteremia From 10/06/2019-Pt unable to tolerate Lipitor--recheck labs next visit with dietary changes. She developed a rash according to the patient with her statin.  Atorvastatin.  Her neurologist discussed possible PSK 9  injections in the future  She tripped over her hair dryer at home this morning and fell against the wardrobe.  Right shoulder is sore but she has no pain with walking and no significant tenderness anywhere. Allergies  Allergen Reactions  . Remicade [Infliximab] Anaphylaxis  . Shellfish-Derived Products Rash and Swelling  . Ciprofloxacin Other (See Comments)    Unknown  . Demerol [Meperidine] Nausea And Vomiting  . Nsaids Other (See Comments)    Unknown  . Tolmetin Other (See Comments)    Unknown  . Aspirin Rash    Patient states she is able to tolerate aspirin  . Clinoril [Sulindac] Rash  . Codeine Nausea And Vomiting and Other (See Comments)    Severe vomiting  . Lipitor [Atorvastatin Calcium] Rash   . Methotrexate Nausea And Vomiting and Rash  . Methotrexate Derivatives Rash and Cough  . Penicillins Rash and Other (See Comments)    Has patient had a PCN reaction causing immediate rash, facial/tongue/throat swelling, SOB or lightheadedness with hypotension: Unknown Has patient had a PCN reaction causing severe rash involving mucus membranes or skin necrosis: No Has patient had a PCN reaction that required hospitalization: No Has patient had a PCN reaction occurring within the last 10 years: No If all of the above answers are "NO", then may proceed with Cephalosporin use.   . Shellfish Allergy Swelling and Rash     Current Outpatient Medications:  .  aspirin EC 81 MG EC tablet, Take 1 tablet (81 mg total) by mouth daily., Disp: 30 tablet, Rfl: 0 .  cholecalciferol (VITAMIN D) 1000 units tablet, Take 1,000 Units by mouth daily. , Disp: , Rfl:  .  DULoxetine (CYMBALTA) 30 MG capsule, TAKE 1 CAPSULE(30 MG) BY MOUTH DAILY, Disp: 30 capsule, Rfl: 12 .  gabapentin (NEURONTIN) 300 MG capsule, 1 tablet three times daily, Disp: 21 capsule, Rfl: 0 .  levothyroxine (SYNTHROID) 75 MCG tablet, TAKE 1 TABLET EVERY DAY BEFORE BREAKFAST, Disp: 90 tablet, Rfl: 3 .  omeprazole (PRILOSEC) 20 MG capsule, TAKE 1 CAPSULE(20 MG) BY MOUTH DAILY, Disp: 30 capsule, Rfl: 12 .  triamterene-hydrochlorothiazide (MAXZIDE-25) 37.5-25 MG tablet, TAKE 1 TABLET EVERY DAY, Disp: 90 tablet, Rfl: 3  Review of Systems  Constitutional: Negative for appetite change, chills, fatigue and fever.  HENT: Negative.   Eyes:  Negative.   Respiratory: Negative for chest tightness and shortness of breath.   Cardiovascular: Negative for chest pain and palpitations.  Gastrointestinal: Negative for abdominal pain, nausea and vomiting.  Endocrine: Negative.   Musculoskeletal: Positive for arthralgias and joint swelling.  Skin: Negative.   Neurological: Negative for dizziness and weakness.  Hematological: Negative.    Psychiatric/Behavioral: Negative.     Social History   Tobacco Use  . Smoking status: Former Smoker    Types: Cigarettes    Quit date: 1980    Years since quitting: 40.9  . Smokeless tobacco: Never Used  Substance Use Topics  . Alcohol use: Yes    Comment: Occasional, maybe 1-2 a month      Objective:   BP 134/80 (BP Location: Left Arm, Patient Position: Sitting, Cuff Size: Large)   Pulse (!) 104   Temp (!) 96.9 F (36.1 C) (Other (Comment))   Resp 18   Ht 5\' 6"  (1.676 m)   Wt 189 lb (85.7 kg)   SpO2 96%   BMI 30.51 kg/m  Vitals:   12/06/19 0815  BP: 134/80  Pulse: (!) 104  Resp: 18  Temp: (!) 96.9 F (36.1 C)  TempSrc: Other (Comment)  SpO2: 96%  Weight: 189 lb (85.7 kg)  Height: 5\' 6"  (1.676 m)  Body mass index is 30.51 kg/m.   Physical Exam Vitals signs reviewed.  Constitutional:      Appearance: Normal appearance.  HENT:     Head: Normocephalic and atraumatic.  Eyes:     General: No scleral icterus. Cardiovascular:     Rate and Rhythm: Normal rate and regular rhythm.     Heart sounds: Normal heart sounds.  Pulmonary:     Effort: Pulmonary effort is normal.     Breath sounds: Normal breath sounds.  Abdominal:     Palpations: Abdomen is soft.  Musculoskeletal:        General: Deformity present.     Comments: Rheumatoid arthritic changes of both hands  Skin:    General: Skin is warm and dry.  Neurological:     Mental Status: She is alert and oriented to person, place, and time. Mental status is at baseline.  Psychiatric:        Mood and Affect: Mood normal.        Behavior: Behavior normal.        Thought Content: Thought content normal.        Judgment: Judgment normal.      No results found for any visits on 12/06/19.     Assessment & Plan    1. Hypercholesteremia LDL goal is less than 70 and patient with recent stroke.  Last LDL was 117.  Patient is agreeable to trying Zetia depending on what her lipids show today.  She is fasting.   May try getting help from [facility] pharmacist if she needs Repatha - Lipid panel  2. Essential (primary) hypertension Blood pressure good today.  She is on Maxide  3. Cerebrovascular accident (CVA), unspecified mechanism (HCC) All risk factors treated.  She sees neurology next month.  4. Rheumatoid arthritis involving multiple sites with positive rheumatoid factor (HCC) She was on Humira for greater than 20 years.  She has also tried Enbrel Remicade and Orencia. She is unwilling and unable to afford any of these drugs.  5. Major depressive disorder in partial remission, unspecified whether recurrent (HCC) In partial remission.  She is doing well.    Follow up in 6 months.  I,Margeaux Swantek,acting as a scribe for Megan Mans, MD.,have documented all relevant documentation on the behalf of Megan Mans, MD,as directed by  Megan Mans, MD while in the presence of Megan Mans, MD.      Megan Mans, MD  [facility]

## 2019-12-01 NOTE — Addendum Note (Signed)
Addended by: Judie Petit on: 12/01/2019 08:47 AM   Modules accepted: Orders

## 2019-12-02 MED ORDER — GABAPENTIN 300 MG PO CAPS: ORAL_CAPSULE | ORAL | 0 refills | Status: DC

## 2019-12-02 NOTE — Telephone Encounter (Signed)
Pt states she had told the person that answered she needed the gabapentin (NEURONTIN) 300 MG capsule  Sent to [facility]. However, since it was sent to [facility], (which is ok) she is requesting a 7 day supply sent to  [place], [place] - [place] [phone] (Phone) [phone] (Fax)   Pt states she is in a lot of pain. And cannot wait for another week to get mail order

## 2019-12-02 NOTE — Telephone Encounter (Signed)
OK 

## 2019-12-02 NOTE — Addendum Note (Signed)
Addended by: Julieta Bellini on: 12/02/2019 03:31 PM   Modules accepted: Orders

## 2019-12-02 NOTE — Telephone Encounter (Signed)
Is this ok ? Please advise

## 2019-12-02 NOTE — Telephone Encounter (Signed)
Rx sent to pharmacy   

## 2019-12-06 ENCOUNTER — Ambulatory Visit (INDEPENDENT_AMBULATORY_CARE_PROVIDER_SITE_OTHER): Payer: Medicare PPO | Admitting: Family Medicine

## 2019-12-06 ENCOUNTER — Other Ambulatory Visit: Payer: Self-pay

## 2019-12-06 ENCOUNTER — Encounter: Payer: Self-pay | Admitting: Family Medicine

## 2019-12-06 VITALS — BP 134/80 | HR 104 | Temp 96.9°F | Resp 18 | Ht 66.0 in | Wt 189.0 lb

## 2019-12-06 DIAGNOSIS — E7800 Pure hypercholesterolemia, unspecified: Secondary | ICD-10-CM

## 2019-12-06 DIAGNOSIS — I639 Cerebral infarction, unspecified: Secondary | ICD-10-CM | POA: Diagnosis not present

## 2019-12-06 DIAGNOSIS — M0579 Rheumatoid arthritis with rheumatoid factor of multiple sites without organ or systems involvement: Secondary | ICD-10-CM

## 2019-12-06 DIAGNOSIS — I10 Essential (primary) hypertension: Secondary | ICD-10-CM | POA: Diagnosis not present

## 2019-12-06 DIAGNOSIS — F324 Major depressive disorder, single episode, in partial remission: Secondary | ICD-10-CM

## 2019-12-07 ENCOUNTER — Ambulatory Visit: Payer: Self-pay | Admitting: Family Medicine

## 2019-12-07 LAB — LIPID PANEL
Chol/HDL Ratio: 3.1 ratio (ref 0.0–4.4)
Cholesterol, Total: 155 mg/dL (ref 100–199)
HDL: 50 mg/dL (ref >39)
LDL Chol Calc (NIH): 87 mg/dL (ref 0–99)
Triglycerides: 98 mg/dL (ref 0–149)
VLDL Cholesterol Cal: 18 mg/dL (ref 5–40)

## 2019-12-13 ENCOUNTER — Other Ambulatory Visit: Payer: Self-pay | Admitting: Family Medicine

## 2019-12-13 ENCOUNTER — Ambulatory Visit: Payer: Medicare PPO | Admitting: Neurology

## 2019-12-13 NOTE — Telephone Encounter (Signed)
[facility] faxed refill request for the following medications:  gabapentin (NEURONTIN) 300 MG capsule   Please advise.  Thanks, TGH 

## 2019-12-14 MED ORDER — GABAPENTIN 300 MG PO CAPS: ORAL_CAPSULE | ORAL | 0 refills | Status: DC

## 2020-01-11 ENCOUNTER — Telehealth: Payer: Self-pay

## 2020-01-11 ENCOUNTER — Telehealth: Admit: 2020-01-11 | Discharge: 2020-01-12 | Payer: MEDICARE | Attending: Rheumatology | Primary: Rheumatology

## 2020-01-11 DIAGNOSIS — M059 Rheumatoid arthritis with rheumatoid factor, unspecified: Secondary | ICD-10-CM | POA: Diagnosis not present

## 2020-01-11 DIAGNOSIS — Z8673 Personal history of transient ischemic attack (TIA), and cerebral infarction without residual deficits: Secondary | ICD-10-CM | POA: Diagnosis not present

## 2020-01-11 DIAGNOSIS — M154 Erosive (osteo)arthritis: Secondary | ICD-10-CM | POA: Diagnosis not present

## 2020-01-11 NOTE — Telephone Encounter (Signed)
Please advise 

## 2020-01-11 NOTE — Telephone Encounter (Signed)
Copied from CRM [id number]. Topic: Referral - Request for Referral >> Jan 11, 2020  1:27 PM Wyonia Hough E wrote: Has patient seen PCP for this complaint? Yes  *If NO, is insurance requiring patient see PCP for this issue before PCP can refer them? Referral for which specialty: Physical therapy  Preferred provider/office: [facility]  Phone# [phone] fax# [phone] Reason for referral: Right shoulder and right arm pain

## 2020-01-11 NOTE — Unmapped (Signed)
Continue prednisone 5 mg daily    OK to receive COVID vaccine when it is available to you.     Please discuss pneumococcal vaccine with Dr. Judie Petit is recommended also.

## 2020-01-11 NOTE — Unmapped (Signed)
Primary care MD: Dr. [name]    Consulting physician: Dr. Pearlean Brownie (neurology)    HPI: Pamela Mercer is a 69 yo woman with longstanding RF/CCP+ RA. She returns today for routine followup.  She remains on low dose prednisone 5 mg daily.     Previous therapies have included:  MTX-- D/C for ? Pulmonary toxicity  Enbrel  D/C in 2011 'making her sick'-- Not sure when it was started.  She came to me in 2008 on this.   Orencia (2011-2014) D/C due to cost    Has also taken oral gold, leflunomide, HCQ.  Humira--> injection site reactions.  Remicade--> peritonitis    She was admitted last fall with a stroke at [facility].  She had the abrupt onset of R leg and arm weakness.  She did not go to the hospital immediately.  Eventually, on the advice of a family member, she did go.  Imaging demonstrated a small acute ischemic nonhemorrhagic infarct of the posterior left corona radiata.     She is now under the care of Dr. Pearlean Brownie, a neurologist.      She is walking regularly and eating properly.  She reports no major changes in her joints.  She is not interested in additional medications.      She has not had any fever, chills, or night sweats.  No oral ulcers, sicca symptoms.        Medication Sig   ??? aspirin (ECOTRIN) 81 MG tablet Take 81 mg by mouth daily.   ??? cholecalciferol, vitamin D3, (CHOLECALCIFEROL) 1,000 unit tablet Take 1,000 Units by mouth.   ??? DULoxetine (CYMBALTA) 30 MG capsule TAKE 1 CAPSULE BY MOUTH EVERY DAY   ??? levothyroxine (SYNTHROID, LEVOTHROID) 75 MCG tablet TAKE 1 TABLET BY MOUTH EVERY DAY   ??? omeprazole (PRILOSEC) 20 MG capsule TAKE 1 CAPSULE(20 MG) BY MOUTH DAILY   ??? predniSONE (DELTASONE) 5 MG tablet TAKE 1 TO 2 TABLETS EVERY DAY   ??? triamterene-hydrochlorothiazide (MAXZIDE-25) 37.5-25 mg per tablet Take 0.5 tablets by mouth daily.    ??? FLUVIRIN influenza vaccine, trivalent (28yr+) 2016-17 ADM 0.5ML IM UTD   ??? gabapentin (NEURONTIN) 300 MG capsule Take by mouth Three (3) times a day. ??? lysine 1,000 mg Tab Take 1 tablet by mouth daily.       Immunization History   Administered Date(s) Administered   ??? Influenza Vaccine Quad (IIV4 PF) 76mo+ injectable 12/03/2016, 09/18/2017, 10/06/2019   ??? Influenza Virus Vaccine, unspecified formulation 10/13/2014, 10/20/2015   ??? PNEUMOCOCCAL POLYSACCHARIDE 23 10/24/2009, 01/10/2015   ??? Pneumococcal Conjugate 13-Valent 07/26/2014   ??? TdaP 11/03/2007, 12/09/2011       Allergies   Allergen Reactions   ??? Infliximab Other (See Comments) and Anaphylaxis     Cough, rash, kidneys pulsing. vomiting   ??? Shellfish Containing Products Rash and Swelling   ??? Nsaids (Non-Steroidal Anti-Inflammatory Drug)      clinoril   ??? Tolmetin    ??? Ciprofloxacin Rash     Other reaction(s): UNKNOWN   ??? Codeine Nausea And Vomiting     Severe vomiting  Severe vomiting   ??? Meperidine Nausea And Vomiting   ??? Methotrexate Nausea And Vomiting and Rash   ??? Penicillins Rash   ??? Sulindac Rash     ROS; 10 systems are reviewed and are negative except that mentioned in the HPI    IMAGING  DXA March 2018  The bone mineral density in the spine measuring L1 to 4 measures 0.888 gm/cm2. ??The ??  Z score is 0.2 and the T score is -1.4. ??This value is above the fracture risk threshold.  The total bone mineral density in the proximal left femur measures 0.777 gm/cm2. ??The Z score is -0.2 and the T score is -1.4. ??This value is above the fracture risk threshold. ??The femoral neck density is 0.700 gm/cm2, and the T score is -1.3. ??The other T scores range from -2.0 to -0.9.    HANDS March 2019  RIGHT: Patient is status post first and second metacarpophalangeal joint arthrodeses. Hardware appears intact. No perihardware lucency or fracture. There is persistent diffuse osteopenia and no significant change in marginal erosions of the second through fourth metacarpophalangeal joints and proximal interphalangeal joints. Interval osseous remodeling at the distal ulna. LEFT: Unchanged sequelae of first and second arthrodesis with plate and screws with no perihardware lucency or fracture. No significant change in erosion along the radial aspect of the left third metacarpal head. No significant change in multifocal PIP joint space narrowing with marginal erosions. Ulnar styloid process erosions are also unchanged.    FEET March 2019  LEFT: There are marginal osseous erosions at the MTP joints. Additionally there are osseous erosions at the medial head of the first proximal phalanx and base of the first distal phalanx which are new from prior. Similar appearance of joint space narrowing and lateral angulation/mild subluxation at the second through fourth MTP joints. Joint spaces are otherwise preserved. Multiple, interphalangeal joint flexion deformities. There is a small plantar calcaneal enthesophyte.  RIGHT: Redemonstration of marginal erosions along the MTP joints and distal first phalanx. Unchanged large lucency in the first proximal phalanx, which may represent erosive change. ??Increased lateral angulation deformities at the MTP joints. The joint spaces are otherwise preserved. There is a small plantar calcaneal enthesophyte.    CxR March 2019  Clear lung fields    Assessment and Plan:   Pamela Mercer is a 69 year old woman with longstanding seropositive erosive rheumatoid arthritis. She has recently had a CVA and is doing Ok in that regard. She has some disease activity and a lot of articular damage.  She has opted not to be on any DMARD therapy due to concers for long term toxicity.   She is doing OK on prednisone monotherapy. We will continue prednisone 5 mg daily.     I have again recommended the [facility] vaccine and have advised that she had that at a local pharmacy.   She will be due for a pneumococcus vaccine this year. She will discuss this with Dr. Sullivan Lone.   I have also advised that she received the Coronavirus vaccine when it is available to her. F/U will be in 1 year or sooner as needed.       I spent 16 minutes on the real-time audio and video visit with the patient on the date of service. I spent an additional on pre- and post-visit activities.     The patient was not located and I was located within 250 yards of a hospital based location during the real-time audio and video visit. The patient was physically located in [place] or a state in which I am permitted to provide care. The patient and/or parent/guardian understood that s/he may incur co-pays and cost sharing, and agreed to the telemedicine visit. The visit was reasonable and appropriate under the circumstances given the patient's presentation at the time.    The patient and/or parent/guardian has been advised of the potential risks and limitations of this  mode of treatment (including, but not limited to, the absence of in-person examination) and has agreed to be treated using telemedicine. The patient's/patient's family's questions regarding telemedicine have been answered.    If the visit was completed in an ambulatory setting, the patient and/or parent/guardian has also been advised to contact their provider???s office for worsening conditions, and seek emergency medical treatment and/or call 911 if the patient deems either necessary.

## 2020-01-12 ENCOUNTER — Ambulatory Visit: Payer: Medicare PPO | Admitting: Neurology

## 2020-01-12 ENCOUNTER — Encounter: Payer: Self-pay | Admitting: Neurology

## 2020-01-12 ENCOUNTER — Other Ambulatory Visit: Payer: Self-pay

## 2020-01-12 VITALS — BP 140/90 | HR 86 | Temp 96.8°F | Ht 67.0 in | Wt 191.2 lb

## 2020-01-12 DIAGNOSIS — M0559 Rheumatoid polyneuropathy with rheumatoid arthritis of multiple sites: Secondary | ICD-10-CM | POA: Diagnosis not present

## 2020-01-12 DIAGNOSIS — G464 Cerebellar stroke syndrome: Secondary | ICD-10-CM | POA: Diagnosis not present

## 2020-01-12 DIAGNOSIS — M7581 Other shoulder lesions, right shoulder: Secondary | ICD-10-CM | POA: Diagnosis not present

## 2020-01-12 DIAGNOSIS — I6990 Unspecified sequelae of unspecified cerebrovascular disease: Secondary | ICD-10-CM | POA: Diagnosis not present

## 2020-01-12 DIAGNOSIS — M5413 Radiculopathy, cervicothoracic region: Secondary | ICD-10-CM | POA: Diagnosis not present

## 2020-01-12 NOTE — Patient Instructions (Signed)
I had a long d/w patient about his recent stroke, risk for recurrent stroke/TIAs, personally independently reviewed imaging studies and stroke evaluation results and answered questions.Continue aspirin 81 mg daily  for secondary stroke prevention and maintain strict control of hypertension with blood pressure goal below 130/90, diabetes with hemoglobin A1c goal below 6.5% and lipids with LDL cholesterol goal below 70 mg/dL. I also advised the patient to eat a healthy diet with plenty of whole grains, cereals, fruits and vegetables, exercise regularly and maintain ideal body weight Followup in the future with me in  1 year or call earlier if necessary. 

## 2020-01-12 NOTE — Progress Notes (Signed)
[facility] [address] [place]. [place] [address] [phone]       OFFICE FOLLOW UP VISIT NOTE  [name] Date of Birth:  March 21, 1951 Medical Record Number:  [id number]   Referring MD: Angela Sherman  Reason for Referral: Stroke  [id number] consult 10/19/2020:  [name] is a pleasant 69 year old Caucasian lady seen today for initial office consultation visit for stroke.  History is obtained from the patient and review of electronic medical records and I personally reviewed imaging films in PACS.  She is a 69 year old lady with past medical history of GERD, depression, DVT, peritonitis, hypertension, hypothyroidism, rheumatoid arthritis who presented to [facility] emergency room on 09/05/2019 with sudden onset of numbness and tingling in the right hand as well as right foot and some right leg weakness.  CT scan of the head was unremarkable but MRI scan showed 1.4 cm left coronary data lacunar infarct.  MRI of the neck showed no significant extracranial stenosis.  No intracranial vascular imaging was done.  Transthoracic echo showed normal ejection fraction without cardiac source of embolism.  Telemetry monitoring did not reveal cardiac arrhythmias.  LDL cholesterol was elevated at 1 1 7  mg percent.  Hemoglobin A1c was 5.5.  Patient was started on aspirin as well as Lipitor.  She developed a rash within a week after Lipitor and it has since been discontinued.  She is doing a low-fat diet and plans to repeat her lipid profile in December with her primary physician.  She got home physical and occupational therapy and has been discharged and has done well.  She has no significant vascular risk factors except mild obesity and hyperlipidemia.  Her blood pressures well controlled today it is 126/86.  Patient had remote history of DVT years ago but no further episodes.  She has chronic shoulder pain for which she sees a Restaurant manager, fast food.  No other active new  complaints.  She is tolerating aspirin well without bruising or bleeding. Update 01/12/2020 : She returns for follow-up after last visit 200 months ago.  She states she is doing well.  She has had no recurrent stroke or TIA symptoms.  She is tolerating aspirin well without bleeding or bruising.  Her blood pressures well controlled and usually in the 130s but today it is slightly elevated 140/90 in office.  She is doing outpatient physical therapy for upper body muscle strengthening coordination.  She walks 30 minutes every day.  She has been on a strict low-fat diet and her lipid profile on 12/06/2019 showed LDL cholesterol had come down to 87 mg percent compared to 119 previously.  Total cholesterol was low at 155 but HDL was 50.  Transcranial Doppler studies done on 10/27/2019 showed showed low normal mean flow velocities throughout due to suboptimal windows and increased pulsatility indices suggestive of diffuse intracranial atherosclerosis.  She has no new complaints today. ROS:   14 system review of systems is positive for numbness, weakness, imbalance, arthritis and all other systems negative  PMH:  Past Medical History:  Diagnosis Date  . Arthritis    RA  . Cough    CHRONIC  . Depression   . GERD (gastroesophageal reflux disease)   . Headache    H/O MIGRAINES  . History of deep vein thrombosis    Right calf  . History of peritonitis 2002?   Right calf.  . Hypertension   . Hypothyroidism   . PONV (postoperative nausea and vomiting)    NO PROBLEM  WITH CATARACT SURGERY  . Thyroid disease     Social History:  Social History   Socioeconomic History  . Marital status: Single    Spouse name: Not on file  . Number of children: 0  . Years of education: 3 years college  . Highest education level: Not on file  Occupational History  . Occupation: Retired  Tobacco Use  . Smoking status: Former Smoker    Types: Cigarettes    Quit date: 1980    Years since quitting: 41.0  . Smokeless  tobacco: Never Used  Substance and Sexual Activity  . Alcohol use: Yes    Comment: Occasional, maybe 1-2 a month  . Drug use: No  . Sexual activity: Never  Other Topics Concern  . Not on file  Social History Narrative   Lives at home alone.   Right-handed.   Two cups caffeine per day.   Social Determinants of Health   Financial Resource Strain:   . Difficulty of Paying Living Expenses: Not on file  Food Insecurity:   . Worried About Programme researcher, broadcasting/film/video in the Last Year: Not on file  . Ran Out of Food in the Last Year: Not on file  Transportation Needs:   . Lack of Transportation (Medical): Not on file  . Lack of Transportation (Non-Medical): Not on file  Physical Activity:   . Days of Exercise per Week: Not on file  . Minutes of Exercise per Session: Not on file  Stress:   . Feeling of Stress : Not on file  Social Connections:   . Frequency of Communication with Friends and Family: Not on file  . Frequency of Social Gatherings with Friends and Family: Not on file  . Attends Religious Services: Not on file  . Active Member of Clubs or Organizations: Not on file  . Attends Banker Meetings: Not on file  . Marital Status: Not on file  Intimate Partner Violence:   . Fear of Current or Ex-Partner: Not on file  . Emotionally Abused: Not on file  . Physically Abused: Not on file  . Sexually Abused: Not on file    Medications:   Current Outpatient Medications on File Prior to Visit  Medication Sig Dispense Refill  . aspirin EC 81 MG EC tablet Take 1 tablet (81 mg total) by mouth daily. 30 tablet 0  . cholecalciferol (VITAMIN D) 1000 units tablet Take 1,000 Units by mouth daily.     . DULoxetine (CYMBALTA) 30 MG capsule TAKE 1 CAPSULE(30 MG) BY MOUTH DAILY 30 capsule 12  . gabapentin (NEURONTIN) 300 MG capsule 1 tablet three times daily 21 capsule 0  . L-Lysine 1000 MG TABS Take by mouth.    . levothyroxine (SYNTHROID) 75 MCG tablet TAKE 1 TABLET EVERY DAY BEFORE  BREAKFAST 90 tablet 3  . omeprazole (PRILOSEC) 20 MG capsule TAKE 1 CAPSULE(20 MG) BY MOUTH DAILY 30 capsule 12  . triamterene-hydrochlorothiazide (MAXZIDE-25) 37.5-25 MG tablet TAKE 1 TABLET EVERY DAY 90 tablet 3   No current facility-administered medications on file prior to visit.    Allergies:   Allergies  Allergen Reactions  . Remicade [Infliximab] Anaphylaxis  . Shellfish-Derived Products Rash and Swelling  . Ciprofloxacin Other (See Comments)    Unknown  . Demerol [Meperidine] Nausea And Vomiting  . Nsaids Other (See Comments)    Unknown  . Tolmetin Other (See Comments)    Unknown  . Aspirin Rash    Patient states she is able to tolerate  aspirin  . Clinoril [Sulindac] Rash  . Codeine Nausea And Vomiting and Other (See Comments)    Severe vomiting  . Lipitor [Atorvastatin Calcium] Rash  . Methotrexate Nausea And Vomiting and Rash  . Methotrexate Derivatives Rash and Cough  . Penicillins Rash and Other (See Comments)    Has patient had a PCN reaction causing immediate rash, facial/tongue/throat swelling, SOB or lightheadedness with hypotension: Unknown Has patient had a PCN reaction causing severe rash involving mucus membranes or skin necrosis: No Has patient had a PCN reaction that required hospitalization: No Has patient had a PCN reaction occurring within the last 10 years: No If all of the above answers are "NO", then may proceed with Cephalosporin use.   . Shellfish Allergy Swelling and Rash    Physical Exam General: Obese middle-aged Caucasian lady seated, in no evident distress Head: head normocephalic and atraumatic.   Neck: supple with no carotid or supraclavicular bruits Cardiovascular: regular rate and rhythm, no murmurs Musculoskeletal: Bilateral hand metacarpal phalangeal joint deformities from rheumatoid arthritis Skin:  no rash/petichiae Vascular:  Normal pulses all extremities  Neurologic Exam Mental Status: Awake and fully alert. Oriented to place  and time. Recent and remote memory intact. Attention span, concentration and fund of knowledge appropriate. Mood and affect appropriate.  Cranial Nerves: Fundoscopic exam not done pupils equal, briskly reactive to light. Extraocular movements full without nystagmus. Visual fields full to confrontation. Hearing intact. Facial sensation intact.  Mild right lower facial asymmetry., tongue, palate moves normally and symmetrically.  Motor: Normal bulk and tone. Normal strength in all tested extremity muscles.  Diminished fine finger movements on the right.  Grips left over right upper extremity.  Mild right grip weakness. Sensory.: intact to touch , pinprick , position and vibratory sensation.  Coordination: Rapid alternating movements normal in all extremities. Finger-to-nose and heel-to-shin performed accurately bilaterally. Gait and Station: Arises from chair without difficulty. Stance is normal. Gait demonstrates normal stride length and balance . Able to heel, toe and tandem walk with slight difficulty.  Reflexes: 1+ and symmetric. Toes downgoing.       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  Vascular risk factors of obesity, hyperlipidemia only     PLAN: I had a long d/w patient about his recent stroke, risk for recurrent stroke/TIAs, personally independently reviewed imaging studies and stroke evaluation results and answered questions.Continue aspirin 81 mg daily  for secondary stroke prevention and maintain strict control of hypertension with blood pressure goal below 130/90, diabetes with hemoglobin A1c goal below 6.5% and lipids with LDL cholesterol goal below 70 mg/dL. I also advised the patient to eat a healthy diet with plenty of whole grains, cereals, fruits and vegetables, exercise regularly and maintain ideal body weight Followup in the future with me in  1 year or call earlier if necessary. I also advised  the patient to eat a healthy diet with plenty of whole grains, cereals, fruits and vegetables, exercise regularly and maintain ideal body weight .Greater than 50% time during this 25-minute visit was spent on counseling and coordination of care about her lacunar stroke and answering questions  . Delia Heady, MD  [facility] [address] [place], [place] [address]  Phone [phone] Fax [phone] Note: This document was prepared with digital dictation and possible smart phrase technology. Any transcriptional errors that result from this process are unintentional.

## 2020-01-14 DIAGNOSIS — M0559 Rheumatoid polyneuropathy with rheumatoid arthritis of multiple sites: Secondary | ICD-10-CM | POA: Diagnosis not present

## 2020-01-14 DIAGNOSIS — M7581 Other shoulder lesions, right shoulder: Secondary | ICD-10-CM | POA: Diagnosis not present

## 2020-01-14 DIAGNOSIS — M5413 Radiculopathy, cervicothoracic region: Secondary | ICD-10-CM | POA: Diagnosis not present

## 2020-01-14 DIAGNOSIS — G464 Cerebellar stroke syndrome: Secondary | ICD-10-CM | POA: Diagnosis not present

## 2020-01-19 DIAGNOSIS — M7581 Other shoulder lesions, right shoulder: Secondary | ICD-10-CM | POA: Diagnosis not present

## 2020-01-19 DIAGNOSIS — M5413 Radiculopathy, cervicothoracic region: Secondary | ICD-10-CM | POA: Diagnosis not present

## 2020-01-19 DIAGNOSIS — G464 Cerebellar stroke syndrome: Secondary | ICD-10-CM | POA: Diagnosis not present

## 2020-01-19 DIAGNOSIS — M0559 Rheumatoid polyneuropathy with rheumatoid arthritis of multiple sites: Secondary | ICD-10-CM | POA: Diagnosis not present

## 2020-01-21 DIAGNOSIS — G464 Cerebellar stroke syndrome: Secondary | ICD-10-CM | POA: Diagnosis not present

## 2020-01-21 DIAGNOSIS — M7581 Other shoulder lesions, right shoulder: Secondary | ICD-10-CM | POA: Diagnosis not present

## 2020-01-21 DIAGNOSIS — M0559 Rheumatoid polyneuropathy with rheumatoid arthritis of multiple sites: Secondary | ICD-10-CM | POA: Diagnosis not present

## 2020-01-21 DIAGNOSIS — M5413 Radiculopathy, cervicothoracic region: Secondary | ICD-10-CM | POA: Diagnosis not present

## 2020-01-25 DIAGNOSIS — G464 Cerebellar stroke syndrome: Secondary | ICD-10-CM | POA: Diagnosis not present

## 2020-01-25 DIAGNOSIS — M0559 Rheumatoid polyneuropathy with rheumatoid arthritis of multiple sites: Secondary | ICD-10-CM | POA: Diagnosis not present

## 2020-01-25 DIAGNOSIS — M7581 Other shoulder lesions, right shoulder: Secondary | ICD-10-CM | POA: Diagnosis not present

## 2020-01-25 DIAGNOSIS — M5413 Radiculopathy, cervicothoracic region: Secondary | ICD-10-CM | POA: Diagnosis not present

## 2020-01-28 DIAGNOSIS — M7581 Other shoulder lesions, right shoulder: Secondary | ICD-10-CM | POA: Diagnosis not present

## 2020-01-28 DIAGNOSIS — M5413 Radiculopathy, cervicothoracic region: Secondary | ICD-10-CM | POA: Diagnosis not present

## 2020-01-28 DIAGNOSIS — G464 Cerebellar stroke syndrome: Secondary | ICD-10-CM | POA: Diagnosis not present

## 2020-01-28 DIAGNOSIS — M0559 Rheumatoid polyneuropathy with rheumatoid arthritis of multiple sites: Secondary | ICD-10-CM | POA: Diagnosis not present

## 2020-02-01 DIAGNOSIS — G464 Cerebellar stroke syndrome: Secondary | ICD-10-CM | POA: Diagnosis not present

## 2020-02-01 DIAGNOSIS — M5413 Radiculopathy, cervicothoracic region: Secondary | ICD-10-CM | POA: Diagnosis not present

## 2020-02-01 DIAGNOSIS — M0559 Rheumatoid polyneuropathy with rheumatoid arthritis of multiple sites: Secondary | ICD-10-CM | POA: Diagnosis not present

## 2020-02-01 DIAGNOSIS — M7581 Other shoulder lesions, right shoulder: Secondary | ICD-10-CM | POA: Diagnosis not present

## 2020-02-04 DIAGNOSIS — M0559 Rheumatoid polyneuropathy with rheumatoid arthritis of multiple sites: Secondary | ICD-10-CM | POA: Diagnosis not present

## 2020-02-04 DIAGNOSIS — M7581 Other shoulder lesions, right shoulder: Secondary | ICD-10-CM | POA: Diagnosis not present

## 2020-02-04 DIAGNOSIS — M5413 Radiculopathy, cervicothoracic region: Secondary | ICD-10-CM | POA: Diagnosis not present

## 2020-02-04 DIAGNOSIS — G464 Cerebellar stroke syndrome: Secondary | ICD-10-CM | POA: Diagnosis not present

## 2020-02-07 DIAGNOSIS — M7581 Other shoulder lesions, right shoulder: Secondary | ICD-10-CM | POA: Diagnosis not present

## 2020-02-07 DIAGNOSIS — M5413 Radiculopathy, cervicothoracic region: Secondary | ICD-10-CM | POA: Diagnosis not present

## 2020-02-07 DIAGNOSIS — G464 Cerebellar stroke syndrome: Secondary | ICD-10-CM | POA: Diagnosis not present

## 2020-02-07 DIAGNOSIS — M0559 Rheumatoid polyneuropathy with rheumatoid arthritis of multiple sites: Secondary | ICD-10-CM | POA: Diagnosis not present

## 2020-02-11 DIAGNOSIS — G464 Cerebellar stroke syndrome: Secondary | ICD-10-CM | POA: Diagnosis not present

## 2020-02-11 DIAGNOSIS — M0559 Rheumatoid polyneuropathy with rheumatoid arthritis of multiple sites: Secondary | ICD-10-CM | POA: Diagnosis not present

## 2020-02-11 DIAGNOSIS — M7581 Other shoulder lesions, right shoulder: Secondary | ICD-10-CM | POA: Diagnosis not present

## 2020-02-11 DIAGNOSIS — M5413 Radiculopathy, cervicothoracic region: Secondary | ICD-10-CM | POA: Diagnosis not present

## 2020-02-12 ENCOUNTER — Ambulatory Visit: Admit: 2020-02-12 | Discharge: 2020-02-13 | Payer: MEDICARE

## 2020-02-22 DIAGNOSIS — M0559 Rheumatoid polyneuropathy with rheumatoid arthritis of multiple sites: Secondary | ICD-10-CM | POA: Diagnosis not present

## 2020-02-22 DIAGNOSIS — M7581 Other shoulder lesions, right shoulder: Secondary | ICD-10-CM | POA: Diagnosis not present

## 2020-02-22 DIAGNOSIS — M5413 Radiculopathy, cervicothoracic region: Secondary | ICD-10-CM | POA: Diagnosis not present

## 2020-02-22 DIAGNOSIS — G464 Cerebellar stroke syndrome: Secondary | ICD-10-CM | POA: Diagnosis not present

## 2020-02-24 DIAGNOSIS — G464 Cerebellar stroke syndrome: Secondary | ICD-10-CM | POA: Diagnosis not present

## 2020-02-24 DIAGNOSIS — M5413 Radiculopathy, cervicothoracic region: Secondary | ICD-10-CM | POA: Diagnosis not present

## 2020-02-24 DIAGNOSIS — M7581 Other shoulder lesions, right shoulder: Secondary | ICD-10-CM | POA: Diagnosis not present

## 2020-02-24 DIAGNOSIS — M0559 Rheumatoid polyneuropathy with rheumatoid arthritis of multiple sites: Secondary | ICD-10-CM | POA: Diagnosis not present

## 2020-02-28 DIAGNOSIS — M5413 Radiculopathy, cervicothoracic region: Secondary | ICD-10-CM | POA: Diagnosis not present

## 2020-02-28 DIAGNOSIS — G464 Cerebellar stroke syndrome: Secondary | ICD-10-CM | POA: Diagnosis not present

## 2020-02-28 DIAGNOSIS — M0559 Rheumatoid polyneuropathy with rheumatoid arthritis of multiple sites: Secondary | ICD-10-CM | POA: Diagnosis not present

## 2020-02-28 DIAGNOSIS — M7581 Other shoulder lesions, right shoulder: Secondary | ICD-10-CM | POA: Diagnosis not present

## 2020-03-02 DIAGNOSIS — M7581 Other shoulder lesions, right shoulder: Secondary | ICD-10-CM | POA: Diagnosis not present

## 2020-03-02 DIAGNOSIS — G464 Cerebellar stroke syndrome: Secondary | ICD-10-CM | POA: Diagnosis not present

## 2020-03-02 DIAGNOSIS — M0559 Rheumatoid polyneuropathy with rheumatoid arthritis of multiple sites: Secondary | ICD-10-CM | POA: Diagnosis not present

## 2020-03-02 DIAGNOSIS — M5413 Radiculopathy, cervicothoracic region: Secondary | ICD-10-CM | POA: Diagnosis not present

## 2020-03-04 ENCOUNTER — Ambulatory Visit: Admit: 2020-03-04 | Discharge: 2020-03-05 | Payer: MEDICARE

## 2020-03-07 DIAGNOSIS — G464 Cerebellar stroke syndrome: Secondary | ICD-10-CM | POA: Diagnosis not present

## 2020-03-07 DIAGNOSIS — M0559 Rheumatoid polyneuropathy with rheumatoid arthritis of multiple sites: Secondary | ICD-10-CM | POA: Diagnosis not present

## 2020-03-07 DIAGNOSIS — M7581 Other shoulder lesions, right shoulder: Secondary | ICD-10-CM | POA: Diagnosis not present

## 2020-03-07 DIAGNOSIS — M5413 Radiculopathy, cervicothoracic region: Secondary | ICD-10-CM | POA: Diagnosis not present

## 2020-03-09 DIAGNOSIS — M0559 Rheumatoid polyneuropathy with rheumatoid arthritis of multiple sites: Secondary | ICD-10-CM | POA: Diagnosis not present

## 2020-03-09 DIAGNOSIS — M7581 Other shoulder lesions, right shoulder: Secondary | ICD-10-CM | POA: Diagnosis not present

## 2020-03-09 DIAGNOSIS — G464 Cerebellar stroke syndrome: Secondary | ICD-10-CM | POA: Diagnosis not present

## 2020-03-09 DIAGNOSIS — M5413 Radiculopathy, cervicothoracic region: Secondary | ICD-10-CM | POA: Diagnosis not present

## 2020-03-14 DIAGNOSIS — M7581 Other shoulder lesions, right shoulder: Secondary | ICD-10-CM | POA: Diagnosis not present

## 2020-03-14 DIAGNOSIS — M5413 Radiculopathy, cervicothoracic region: Secondary | ICD-10-CM | POA: Diagnosis not present

## 2020-03-14 DIAGNOSIS — M0559 Rheumatoid polyneuropathy with rheumatoid arthritis of multiple sites: Secondary | ICD-10-CM | POA: Diagnosis not present

## 2020-03-14 DIAGNOSIS — G464 Cerebellar stroke syndrome: Secondary | ICD-10-CM | POA: Diagnosis not present

## 2020-03-21 DIAGNOSIS — M5413 Radiculopathy, cervicothoracic region: Secondary | ICD-10-CM | POA: Diagnosis not present

## 2020-03-21 DIAGNOSIS — G464 Cerebellar stroke syndrome: Secondary | ICD-10-CM | POA: Diagnosis not present

## 2020-03-21 DIAGNOSIS — M0559 Rheumatoid polyneuropathy with rheumatoid arthritis of multiple sites: Secondary | ICD-10-CM | POA: Diagnosis not present

## 2020-03-21 DIAGNOSIS — M7581 Other shoulder lesions, right shoulder: Secondary | ICD-10-CM | POA: Diagnosis not present

## 2020-03-23 DIAGNOSIS — M7581 Other shoulder lesions, right shoulder: Secondary | ICD-10-CM | POA: Diagnosis not present

## 2020-03-23 DIAGNOSIS — M0559 Rheumatoid polyneuropathy with rheumatoid arthritis of multiple sites: Secondary | ICD-10-CM | POA: Diagnosis not present

## 2020-03-23 DIAGNOSIS — M5413 Radiculopathy, cervicothoracic region: Secondary | ICD-10-CM | POA: Diagnosis not present

## 2020-03-23 DIAGNOSIS — G464 Cerebellar stroke syndrome: Secondary | ICD-10-CM | POA: Diagnosis not present

## 2020-03-28 DIAGNOSIS — G464 Cerebellar stroke syndrome: Secondary | ICD-10-CM | POA: Diagnosis not present

## 2020-03-28 DIAGNOSIS — M5413 Radiculopathy, cervicothoracic region: Secondary | ICD-10-CM | POA: Diagnosis not present

## 2020-03-28 DIAGNOSIS — M7581 Other shoulder lesions, right shoulder: Secondary | ICD-10-CM | POA: Diagnosis not present

## 2020-03-28 DIAGNOSIS — M0559 Rheumatoid polyneuropathy with rheumatoid arthritis of multiple sites: Secondary | ICD-10-CM | POA: Diagnosis not present

## 2020-03-29 ENCOUNTER — Ambulatory Visit: Payer: Medicare PPO | Admitting: Family Medicine

## 2020-03-30 DIAGNOSIS — M7581 Other shoulder lesions, right shoulder: Secondary | ICD-10-CM | POA: Diagnosis not present

## 2020-03-30 DIAGNOSIS — G464 Cerebellar stroke syndrome: Secondary | ICD-10-CM | POA: Diagnosis not present

## 2020-03-30 DIAGNOSIS — M5413 Radiculopathy, cervicothoracic region: Secondary | ICD-10-CM | POA: Diagnosis not present

## 2020-03-30 DIAGNOSIS — M0559 Rheumatoid polyneuropathy with rheumatoid arthritis of multiple sites: Secondary | ICD-10-CM | POA: Diagnosis not present

## 2020-04-04 DIAGNOSIS — G464 Cerebellar stroke syndrome: Secondary | ICD-10-CM | POA: Diagnosis not present

## 2020-04-04 DIAGNOSIS — M5413 Radiculopathy, cervicothoracic region: Secondary | ICD-10-CM | POA: Diagnosis not present

## 2020-04-04 DIAGNOSIS — M0559 Rheumatoid polyneuropathy with rheumatoid arthritis of multiple sites: Secondary | ICD-10-CM | POA: Diagnosis not present

## 2020-04-04 DIAGNOSIS — M7581 Other shoulder lesions, right shoulder: Secondary | ICD-10-CM | POA: Diagnosis not present

## 2020-04-06 DIAGNOSIS — M5413 Radiculopathy, cervicothoracic region: Secondary | ICD-10-CM | POA: Diagnosis not present

## 2020-04-06 DIAGNOSIS — M7581 Other shoulder lesions, right shoulder: Secondary | ICD-10-CM | POA: Diagnosis not present

## 2020-04-06 DIAGNOSIS — M0559 Rheumatoid polyneuropathy with rheumatoid arthritis of multiple sites: Secondary | ICD-10-CM | POA: Diagnosis not present

## 2020-04-06 DIAGNOSIS — G464 Cerebellar stroke syndrome: Secondary | ICD-10-CM | POA: Diagnosis not present

## 2020-04-11 DIAGNOSIS — M5413 Radiculopathy, cervicothoracic region: Secondary | ICD-10-CM | POA: Diagnosis not present

## 2020-04-11 DIAGNOSIS — M7581 Other shoulder lesions, right shoulder: Secondary | ICD-10-CM | POA: Diagnosis not present

## 2020-04-11 DIAGNOSIS — M0559 Rheumatoid polyneuropathy with rheumatoid arthritis of multiple sites: Secondary | ICD-10-CM | POA: Diagnosis not present

## 2020-04-11 DIAGNOSIS — G464 Cerebellar stroke syndrome: Secondary | ICD-10-CM | POA: Diagnosis not present

## 2020-04-12 ENCOUNTER — Telehealth: Payer: Self-pay

## 2020-04-12 NOTE — Telephone Encounter (Signed)
Copied from CRM [id number]. Topic: General - Other >> Apr 12, 2020 10:51 AM Dalphine Handing A wrote: Patient feels she has a UTI and would like a callback from nurse in regards to dropping off a urine sample today. Please advise

## 2020-04-12 NOTE — Progress Notes (Addendum)
I,Angela Sherman,acting as a scribe for [employer], PA-C.,have documented all relevant documentation on the behalf of Angela Daring, PA-C,as directed by  Angela Daring, PA-C while in the presence of Angela Sherman, [place].  Established patient visit     Patient: Angela Sherman   DOB: 13-Jan-1951   69 y.o. Female  MRN: [id number] Visit Date: 04/13/2020  Today's healthcare provider: [name], PA-C  Subjective:    Chief Complaint  Patient presents with  . Urinary symptoms   HPI Urinary symptoms  She reports new onset urinary frequency, urinary urgency and burning. The current episode started yesterday and is gradually worsening. Patient states symptoms are moderate in intensity, occurring intermittently. She  has not been recently treated for similar symptoms.    Associated symptoms: No abdominal pain Yes back pain No chills No constipation No cramping No diarrhea No discharge No fever No hematuria No nausea No vomiting  ---------------------------------------------------------------------------------------  -Pt Due for  mammogram -patient declined reports that anything to check for cancer she is not going to do. -Pt Due for Colonoscopy-Declined.     Medications: Outpatient Medications Prior to Visit  Medication Sig  . aspirin EC 81 MG EC tablet Take 1 tablet (81 mg total) by mouth daily.  . cholecalciferol (VITAMIN D) 1000 units tablet Take 1,000 Units by mouth daily.   . DULoxetine (CYMBALTA) 30 MG capsule TAKE 1 CAPSULE(30 MG) BY MOUTH DAILY  . L-Lysine 1000 MG TABS Take by mouth.  . levothyroxine (SYNTHROID) 75 MCG tablet TAKE 1 TABLET EVERY DAY BEFORE BREAKFAST  . omeprazole (PRILOSEC) 20 MG capsule TAKE 1 CAPSULE(20 MG) BY MOUTH DAILY  . triamterene-hydrochlorothiazide (MAXZIDE-25) 37.5-25 MG tablet TAKE 1 TABLET EVERY DAY  . gabapentin (NEURONTIN) 300 MG capsule 1 tablet three times daily (Patient not taking: Reported on  04/13/2020)   No facility-administered medications prior to visit.    Review of Systems  Constitutional: Negative for fatigue and fever.  Respiratory: Positive for cough.   Cardiovascular: Negative.   Gastrointestinal: Negative.   Genitourinary: Positive for dysuria, flank pain (right) and frequency. Negative for urgency, vaginal bleeding, vaginal discharge and vaginal pain.  Musculoskeletal: Positive for arthralgias (chronic, has RA).        Objective:    BP 121/86 (BP Location: Left Arm, Patient Position: Sitting, Cuff Size: Large)   Pulse 80   Temp (!) 96.9 F (36.1 C) (Temporal)   Resp 16   Wt 188 lb 12.8 oz (85.6 kg)   BMI 29.57 kg/m    Physical Exam Constitutional:      General: She is not in acute distress.    Appearance: Normal appearance. She is obese. She is not ill-appearing.  Cardiovascular:     Rate and Rhythm: Normal rate and regular rhythm.     Pulses: Normal pulses.     Heart sounds: Murmur present. Systolic (heard best over L 3rd ICS) murmur present with a grade of 1/6.  Pulmonary:     Effort: Pulmonary effort is normal.     Breath sounds: Normal breath sounds.  Abdominal:     General: Abdomen is flat. Bowel sounds are normal. There is no distension.     Palpations: Abdomen is soft.     Tenderness: There is no abdominal tenderness. There is no right CVA tenderness or left CVA tenderness.  Neurological:     Mental Status: She is alert.      Results for orders placed or performed in visit on 04/13/20  POCT urinalysis dipstick  Result Value Ref Range   Color, UA Yellow    Clarity, UA Clear    Glucose, UA Negative Negative   Bilirubin, UA Negative    Ketones, UA Negative    Spec Grav, UA 1.015 1.010 - 1.025   Blood, UA Trace    pH, UA 6.0 5.0 - 8.0   Protein, UA Negative Negative   Urobilinogen, UA 0.2 0.2 or 1.0 E.U./dL   Nitrite, UA Negative    Leukocytes, UA Negative Negative   Appearance     Odor        Assessment & Plan:    1.  Possible urinary tract infection Will treat with Kleflex as below.  Will send Ucx to the lab.Patient to call if no improvement. - Urine Culture - cephALEXin (KEFLEX) 500 MG capsule; Take 1 capsule (500 mg total) by mouth 2 (two) times daily.  Dispense: 10 capsule; Refill: 0    No follow-ups on file.    Delmer Islam, PA-C, have reviewed all documentation for this visit. The documentation on 04/13/20 for the exam, diagnosis, procedures, and orders are all accurate and complete.   [name]  [facility] [phone] (phone) [phone] (fax)  [facility]

## 2020-04-12 NOTE — Telephone Encounter (Signed)
Scheduled pt for ov with [facility] tomorrow.

## 2020-04-13 ENCOUNTER — Ambulatory Visit: Payer: Medicare PPO | Admitting: Physician Assistant

## 2020-04-13 ENCOUNTER — Other Ambulatory Visit: Payer: Self-pay

## 2020-04-13 ENCOUNTER — Encounter: Payer: Self-pay | Admitting: Physician Assistant

## 2020-04-13 VITALS — BP 121/86 | HR 80 | Temp 96.9°F | Resp 16 | Wt 188.8 lb

## 2020-04-13 DIAGNOSIS — R300 Dysuria: Secondary | ICD-10-CM | POA: Diagnosis not present

## 2020-04-13 DIAGNOSIS — R3989 Other symptoms and signs involving the genitourinary system: Secondary | ICD-10-CM | POA: Diagnosis not present

## 2020-04-13 LAB — POCT URINALYSIS DIPSTICK
Bilirubin, UA: NEGATIVE
Glucose, UA: NEGATIVE
Ketones, UA: NEGATIVE
Leukocytes, UA: NEGATIVE
Nitrite, UA: NEGATIVE
Protein, UA: NEGATIVE
Spec Grav, UA: 1.015 (ref 1.010–1.025)
Urobilinogen, UA: 0.2 E.U./dL
pH, UA: 6 (ref 5.0–8.0)

## 2020-04-13 MED ORDER — CEPHALEXIN 500 MG PO CAPS: 500.0000 mg | ORAL_CAPSULE | Freq: Two times a day (BID) | ORAL | 0 refills | Status: DC

## 2020-04-13 NOTE — Patient Instructions (Signed)
Urinary Tract Infection, Adult A urinary tract infection (UTI) is an infection of any part of the urinary tract. The urinary tract includes:  The kidneys.  The ureters.  The bladder.  The urethra. These organs make, store, and get rid of pee (urine) in the body. What are the causes? This is caused by germs (bacteria) in your genital area. These germs grow and cause swelling (inflammation) of your urinary tract. What increases the risk? You are more likely to develop this condition if:  You have a small, thin tube (catheter) to drain pee.  You cannot control when you pee or poop (incontinence).  You are female, and: ? You use these methods to prevent pregnancy:  A medicine that kills sperm (spermicide).  A device that blocks sperm (diaphragm). ? You have low levels of a female hormone (estrogen). ? You are pregnant.  You have genes that add to your risk.  You are sexually active.  You take antibiotic medicines.  You have trouble peeing because of: ? A prostate that is bigger than normal, if you are female. ? A blockage in the part of your body that drains pee from the bladder (urethra). ? A kidney stone. ? A nerve condition that affects your bladder (neurogenic bladder). ? Not getting enough to drink. ? Not peeing often enough.  You have other conditions, such as: ? Diabetes. ? A weak disease-fighting system (immune system). ? Sickle cell disease. ? Gout. ? Injury of the spine. What are the signs or symptoms? Symptoms of this condition include:  Needing to pee right away (urgently).  Peeing often.  Peeing small amounts often.  Pain or burning when peeing.  Blood in the pee.  Pee that smells bad or not like normal.  Trouble peeing.  Pee that is cloudy.  Fluid coming from the vagina, if you are female.  Pain in the belly or lower back. Other symptoms include:  Throwing up (vomiting).  No urge to eat.  Feeling mixed up (confused).  Being tired  and grouchy (irritable).  A fever.  Watery poop (diarrhea). How is this treated? This condition may be treated with:  Antibiotic medicine.  Other medicines.  Drinking enough water. Follow these instructions at home:  Medicines  Take over-the-counter and prescription medicines only as told by your doctor.  If you were prescribed an antibiotic medicine, take it as told by your doctor. Do not stop taking it even if you start to feel better. General instructions  Make sure you: ? Pee until your bladder is empty. ? Do not hold pee for a long time. ? Empty your bladder after sex. ? Wipe from front to back after pooping if you are a female. Use each tissue one time when you wipe.  Drink enough fluid to keep your pee pale yellow.  Keep all follow-up visits as told by your doctor. This is important. Contact a doctor if:  You do not get better after 1-2 days.  Your symptoms go away and then come back. Get help right away if:  You have very bad back pain.  You have very bad pain in your lower belly.  You have a fever.  You are sick to your stomach (nauseous).  You are throwing up. Summary  A urinary tract infection (UTI) is an infection of any part of the urinary tract.  This condition is caused by germs in your genital area.  There are many risk factors for a UTI. These include having a small, thin   tube to drain pee and not being able to control when you pee or poop.  Treatment includes antibiotic medicines for germs.  Drink enough fluid to keep your pee pale yellow. This information is not intended to replace advice given to you by your health care provider. Make sure you discuss any questions you have with your health care provider. Document Revised: 12/03/2018 Document Reviewed: 06/25/2018 Elsevier Patient Education  2020 [employer] Inc[employer]  

## 2020-04-15 LAB — URINE CULTURE: Organism ID, Bacteria: NO GROWTH

## 2020-04-17 ENCOUNTER — Telehealth: Payer: Self-pay

## 2020-04-17 NOTE — Telephone Encounter (Signed)
Patient advised as directed below. 

## 2020-04-17 NOTE — Telephone Encounter (Signed)
-----   Message from Margaretann Loveless, [place] sent at 04/17/2020  8:18 AM EDT ----- Urine culture had no bacterial growth, but if symptoms are improving continue antibiotic until completed.

## 2020-04-18 DIAGNOSIS — M5413 Radiculopathy, cervicothoracic region: Secondary | ICD-10-CM | POA: Diagnosis not present

## 2020-04-18 DIAGNOSIS — M7581 Other shoulder lesions, right shoulder: Secondary | ICD-10-CM | POA: Diagnosis not present

## 2020-04-18 DIAGNOSIS — M0559 Rheumatoid polyneuropathy with rheumatoid arthritis of multiple sites: Secondary | ICD-10-CM | POA: Diagnosis not present

## 2020-04-18 DIAGNOSIS — G464 Cerebellar stroke syndrome: Secondary | ICD-10-CM | POA: Diagnosis not present

## 2020-04-20 DIAGNOSIS — M7581 Other shoulder lesions, right shoulder: Secondary | ICD-10-CM | POA: Diagnosis not present

## 2020-04-20 DIAGNOSIS — M5413 Radiculopathy, cervicothoracic region: Secondary | ICD-10-CM | POA: Diagnosis not present

## 2020-04-20 DIAGNOSIS — G464 Cerebellar stroke syndrome: Secondary | ICD-10-CM | POA: Diagnosis not present

## 2020-04-20 DIAGNOSIS — M0559 Rheumatoid polyneuropathy with rheumatoid arthritis of multiple sites: Secondary | ICD-10-CM | POA: Diagnosis not present

## 2020-04-25 DIAGNOSIS — M5413 Radiculopathy, cervicothoracic region: Secondary | ICD-10-CM | POA: Diagnosis not present

## 2020-04-25 DIAGNOSIS — M7581 Other shoulder lesions, right shoulder: Secondary | ICD-10-CM | POA: Diagnosis not present

## 2020-04-25 DIAGNOSIS — M0559 Rheumatoid polyneuropathy with rheumatoid arthritis of multiple sites: Secondary | ICD-10-CM | POA: Diagnosis not present

## 2020-04-25 DIAGNOSIS — G464 Cerebellar stroke syndrome: Secondary | ICD-10-CM | POA: Diagnosis not present

## 2020-04-27 DIAGNOSIS — M0559 Rheumatoid polyneuropathy with rheumatoid arthritis of multiple sites: Secondary | ICD-10-CM | POA: Diagnosis not present

## 2020-04-27 DIAGNOSIS — M7581 Other shoulder lesions, right shoulder: Secondary | ICD-10-CM | POA: Diagnosis not present

## 2020-04-27 DIAGNOSIS — M5413 Radiculopathy, cervicothoracic region: Secondary | ICD-10-CM | POA: Diagnosis not present

## 2020-04-27 DIAGNOSIS — G464 Cerebellar stroke syndrome: Secondary | ICD-10-CM | POA: Diagnosis not present

## 2020-05-01 DIAGNOSIS — E039 Hypothyroidism, unspecified: Secondary | ICD-10-CM | POA: Diagnosis not present

## 2020-05-01 DIAGNOSIS — Z79899 Other long term (current) drug therapy: Secondary | ICD-10-CM | POA: Diagnosis not present

## 2020-05-01 DIAGNOSIS — K219 Gastro-esophageal reflux disease without esophagitis: Secondary | ICD-10-CM | POA: Diagnosis not present

## 2020-05-01 DIAGNOSIS — M069 Rheumatoid arthritis, unspecified: Secondary | ICD-10-CM | POA: Diagnosis not present

## 2020-05-01 DIAGNOSIS — E785 Hyperlipidemia, unspecified: Secondary | ICD-10-CM | POA: Diagnosis not present

## 2020-05-01 DIAGNOSIS — F3341 Major depressive disorder, recurrent, in partial remission: Secondary | ICD-10-CM | POA: Diagnosis not present

## 2020-05-01 DIAGNOSIS — I10 Essential (primary) hypertension: Secondary | ICD-10-CM | POA: Diagnosis not present

## 2020-05-04 ENCOUNTER — Other Ambulatory Visit: Payer: Self-pay | Admitting: Family Medicine

## 2020-05-04 DIAGNOSIS — K219 Gastro-esophageal reflux disease without esophagitis: Secondary | ICD-10-CM

## 2020-05-18 DIAGNOSIS — H43813 Vitreous degeneration, bilateral: Secondary | ICD-10-CM | POA: Diagnosis not present

## 2020-05-22 DIAGNOSIS — Z87891 Personal history of nicotine dependence: Secondary | ICD-10-CM | POA: Diagnosis not present

## 2020-05-22 DIAGNOSIS — T753XXA Motion sickness, initial encounter: Secondary | ICD-10-CM | POA: Diagnosis not present

## 2020-05-25 DIAGNOSIS — R262 Difficulty in walking, not elsewhere classified: Secondary | ICD-10-CM | POA: Diagnosis not present

## 2020-05-25 DIAGNOSIS — H8113 Benign paroxysmal vertigo, bilateral: Secondary | ICD-10-CM | POA: Diagnosis not present

## 2020-06-01 DIAGNOSIS — R262 Difficulty in walking, not elsewhere classified: Secondary | ICD-10-CM | POA: Diagnosis not present

## 2020-06-01 DIAGNOSIS — H8113 Benign paroxysmal vertigo, bilateral: Secondary | ICD-10-CM | POA: Diagnosis not present

## 2020-06-05 ENCOUNTER — Ambulatory Visit: Payer: Self-pay | Admitting: Family Medicine

## 2020-06-06 DIAGNOSIS — R262 Difficulty in walking, not elsewhere classified: Secondary | ICD-10-CM | POA: Diagnosis not present

## 2020-06-06 DIAGNOSIS — H8113 Benign paroxysmal vertigo, bilateral: Secondary | ICD-10-CM | POA: Diagnosis not present

## 2020-06-08 DIAGNOSIS — H8113 Benign paroxysmal vertigo, bilateral: Secondary | ICD-10-CM | POA: Diagnosis not present

## 2020-06-08 DIAGNOSIS — R262 Difficulty in walking, not elsewhere classified: Secondary | ICD-10-CM | POA: Diagnosis not present

## 2020-06-30 DIAGNOSIS — R262 Difficulty in walking, not elsewhere classified: Secondary | ICD-10-CM | POA: Diagnosis not present

## 2020-06-30 DIAGNOSIS — H8113 Benign paroxysmal vertigo, bilateral: Secondary | ICD-10-CM | POA: Diagnosis not present

## 2020-07-06 DIAGNOSIS — R262 Difficulty in walking, not elsewhere classified: Secondary | ICD-10-CM | POA: Diagnosis not present

## 2020-07-06 DIAGNOSIS — H8113 Benign paroxysmal vertigo, bilateral: Secondary | ICD-10-CM | POA: Diagnosis not present

## 2020-07-11 DIAGNOSIS — R262 Difficulty in walking, not elsewhere classified: Secondary | ICD-10-CM | POA: Diagnosis not present

## 2020-07-11 DIAGNOSIS — H8113 Benign paroxysmal vertigo, bilateral: Secondary | ICD-10-CM | POA: Diagnosis not present

## 2020-07-13 DIAGNOSIS — H8113 Benign paroxysmal vertigo, bilateral: Secondary | ICD-10-CM | POA: Diagnosis not present

## 2020-07-13 DIAGNOSIS — R262 Difficulty in walking, not elsewhere classified: Secondary | ICD-10-CM | POA: Diagnosis not present

## 2020-07-18 DIAGNOSIS — H8113 Benign paroxysmal vertigo, bilateral: Secondary | ICD-10-CM | POA: Diagnosis not present

## 2020-07-18 DIAGNOSIS — R262 Difficulty in walking, not elsewhere classified: Secondary | ICD-10-CM | POA: Diagnosis not present

## 2020-07-20 DIAGNOSIS — R262 Difficulty in walking, not elsewhere classified: Secondary | ICD-10-CM | POA: Diagnosis not present

## 2020-07-20 DIAGNOSIS — H8113 Benign paroxysmal vertigo, bilateral: Secondary | ICD-10-CM | POA: Diagnosis not present

## 2020-07-25 DIAGNOSIS — H8113 Benign paroxysmal vertigo, bilateral: Secondary | ICD-10-CM | POA: Diagnosis not present

## 2020-07-25 DIAGNOSIS — R262 Difficulty in walking, not elsewhere classified: Secondary | ICD-10-CM | POA: Diagnosis not present

## 2020-07-27 DIAGNOSIS — R262 Difficulty in walking, not elsewhere classified: Secondary | ICD-10-CM | POA: Diagnosis not present

## 2020-07-27 DIAGNOSIS — H8113 Benign paroxysmal vertigo, bilateral: Secondary | ICD-10-CM | POA: Diagnosis not present

## 2020-08-03 DIAGNOSIS — H8113 Benign paroxysmal vertigo, bilateral: Secondary | ICD-10-CM | POA: Diagnosis not present

## 2020-08-03 DIAGNOSIS — R262 Difficulty in walking, not elsewhere classified: Secondary | ICD-10-CM | POA: Diagnosis not present

## 2020-08-08 DIAGNOSIS — R262 Difficulty in walking, not elsewhere classified: Secondary | ICD-10-CM | POA: Diagnosis not present

## 2020-08-08 DIAGNOSIS — H8113 Benign paroxysmal vertigo, bilateral: Secondary | ICD-10-CM | POA: Diagnosis not present

## 2020-08-10 DIAGNOSIS — R262 Difficulty in walking, not elsewhere classified: Secondary | ICD-10-CM | POA: Diagnosis not present

## 2020-08-10 DIAGNOSIS — H8113 Benign paroxysmal vertigo, bilateral: Secondary | ICD-10-CM | POA: Diagnosis not present

## 2020-08-18 DIAGNOSIS — L03115 Cellulitis of right lower limb: Secondary | ICD-10-CM | POA: Diagnosis not present

## 2020-08-18 DIAGNOSIS — W57XXXA Bitten or stung by nonvenomous insect and other nonvenomous arthropods, initial encounter: Secondary | ICD-10-CM | POA: Diagnosis not present

## 2020-08-18 DIAGNOSIS — S80861A Insect bite (nonvenomous), right lower leg, initial encounter: Secondary | ICD-10-CM | POA: Diagnosis not present

## 2020-08-30 ENCOUNTER — Other Ambulatory Visit: Payer: Self-pay | Admitting: Family Medicine

## 2020-08-30 DIAGNOSIS — K219 Gastro-esophageal reflux disease without esophagitis: Secondary | ICD-10-CM

## 2020-08-30 NOTE — Telephone Encounter (Signed)
Requested  medications are  due for refill today yes  Requested medications are on the active medication list yes  Last refill 6/6  Future visit scheduled no  Notes to clinic Do not see med/dx addressed in visit within 12 months, please assess.

## 2020-10-12 DIAGNOSIS — Z20822 Contact with and (suspected) exposure to covid-19: Secondary | ICD-10-CM | POA: Diagnosis not present

## 2020-10-12 DIAGNOSIS — Z03818 Encounter for observation for suspected exposure to other biological agents ruled out: Secondary | ICD-10-CM | POA: Diagnosis not present

## 2020-10-21 DIAGNOSIS — M0579 Rheumatoid arthritis with rheumatoid factor of multiple sites without organ or systems involvement: Principal | ICD-10-CM

## 2020-10-21 MED ORDER — PREDNISONE 5 MG TABLET
ORAL_TABLET | 3 refills | 0 days
Start: 2020-10-21 — End: ?

## 2020-10-24 MED ORDER — PREDNISONE 5 MG TABLET
ORAL_TABLET | 3 refills | 0 days | Status: CP
Start: 2020-10-24 — End: ?

## 2020-10-24 NOTE — Unmapped (Signed)
prednisone refill  Last ov: 01/11/2020   Next ov: 01/16/2021

## 2020-11-01 MED ORDER — DULOXETINE 60 MG CAPSULE,DELAYED RELEASE
Freq: Every day | ORAL | 0 days
Start: 2020-11-01 — End: ?

## 2020-11-03 DIAGNOSIS — R319 Hematuria, unspecified: Secondary | ICD-10-CM | POA: Diagnosis not present

## 2020-11-03 DIAGNOSIS — R310 Gross hematuria: Secondary | ICD-10-CM | POA: Diagnosis not present

## 2020-11-03 DIAGNOSIS — N390 Urinary tract infection, site not specified: Secondary | ICD-10-CM | POA: Diagnosis not present

## 2020-11-30 DIAGNOSIS — R233 Spontaneous ecchymoses: Secondary | ICD-10-CM | POA: Diagnosis not present

## 2020-11-30 DIAGNOSIS — M0579 Rheumatoid arthritis with rheumatoid factor of multiple sites without organ or systems involvement: Secondary | ICD-10-CM | POA: Diagnosis not present

## 2020-12-26 DIAGNOSIS — R300 Dysuria: Secondary | ICD-10-CM | POA: Diagnosis not present

## 2020-12-26 DIAGNOSIS — R319 Hematuria, unspecified: Secondary | ICD-10-CM | POA: Diagnosis not present

## 2021-01-11 ENCOUNTER — Other Ambulatory Visit: Payer: Self-pay

## 2021-01-11 ENCOUNTER — Ambulatory Visit: Payer: Medicare PPO | Admitting: Neurology

## 2021-01-11 ENCOUNTER — Encounter: Payer: Self-pay | Admitting: Neurology

## 2021-01-11 VITALS — BP 145/97 | HR 106 | Ht 64.0 in | Wt 184.2 lb

## 2021-01-11 DIAGNOSIS — I6990 Unspecified sequelae of unspecified cerebrovascular disease: Secondary | ICD-10-CM

## 2021-01-11 NOTE — Progress Notes (Signed)
[facility] [address] [place]. [place] [address] [phone]       OFFICE FOLLOW UP VISIT NOTE  Angela Sherman Date of Birth:  1951-11-03 Medical Record Number:  [id number]   Referring MD: Debbe Odea  Reason for Referral: Stroke  [id number] consult 10/19/2020:  Angela Sherman is a pleasant 70 year old Caucasian lady seen today for initial office consultation visit for stroke.  History is obtained from the patient and review of electronic medical records and I personally reviewed imaging films in PACS.  She is a 70 year old lady with past medical history of GERD, depression, DVT, peritonitis, hypertension, hypothyroidism, rheumatoid arthritis who presented to [facility] emergency room on 09/05/2019 with sudden onset of numbness and tingling in the right hand as well as right foot and some right leg weakness.  CT scan of the head was unremarkable but MRI scan showed 1.4 cm left coronary data lacunar infarct.  MRI of the neck showed no significant extracranial stenosis.  No intracranial vascular imaging was done.  Transthoracic echo showed normal ejection fraction without cardiac source of embolism.  Telemetry monitoring did not reveal cardiac arrhythmias.  LDL cholesterol was elevated at 1 1 7  mg percent.  Hemoglobin A1c was 5.5.  Patient was started on aspirin as well as Lipitor.  She developed a rash within a week after Lipitor and it has since been discontinued.  She is doing a low-fat diet and plans to repeat her lipid profile in December with her primary physician.  She got home physical and occupational therapy and has been discharged and has done well.  She has no significant vascular risk factors except mild obesity and hyperlipidemia.  Her blood pressures well controlled today it is 126/86.  Patient had remote history of DVT years ago but no further episodes.  She has chronic shoulder pain for which she sees a January.  No other active new  complaints.  She is tolerating aspirin well without bruising or bleeding. Update 01/12/2020 : She returns for follow-up after last visit 200 months ago.  She states she is doing well.  She has had no recurrent stroke or TIA symptoms.  She is tolerating aspirin well without bleeding or bruising.  Her blood pressures well controlled and usually in the 130s but today it is slightly elevated 140/90 in office.  She is doing outpatient physical therapy for upper body muscle strengthening coordination.  She walks 30 minutes every day.  She has been on a strict low-fat diet and her lipid profile on 12/06/2019 showed LDL cholesterol had come down to 87 mg percent compared to 119 previously.  Total cholesterol was low at 155 but HDL was 50.  Transcranial Doppler studies done on 10/27/2019 showed showed low normal mean flow velocities throughout due to suboptimal windows and increased pulsatility indices suggestive of diffuse intracranial atherosclerosis.  She has no new complaints today. Update 01/11/2021: She returns for follow-up after last visit a year ago.  She is doing well she has had no recurrent stroke or TIA symptoms.  She remains on aspirin which is tolerating well without bruising or bleeding.  Blood pressure is usually well controlled at home today it is slightly elevated in office at 145/97.  She has not had any recent lipid profile or hemoglobin A 1C checked her carotid ultrasound checked since her stroke.  She had an episode of benign paroxysmal positional vertigo in May 2021 and saw primary care physician.  She was referred for physical therapy exercises  which seem to have helped.  She is doing much better now but does get occasional transient dizziness when she moves suddenly or bends a certain way.  She has no new complaints today. ROS:   14 system review of systems is positive for vertigo, imbalance, arthritis and all other systems negative  PMH:  Past Medical History:  Diagnosis Date  . Arthritis     RA  . Cough    CHRONIC  . Depression   . GERD (gastroesophageal reflux disease)   . Headache    H/O MIGRAINES  . History of deep vein thrombosis    Right calf  . History of peritonitis 2002?   Right calf.  . Hypertension   . Hypothyroidism   . PONV (postoperative nausea and vomiting)    NO PROBLEM WITH CATARACT SURGERY  . Rheumatoid arthritis (HCC)   . Stroke (HCC) 09/03/2019  . Thyroid disease     Social History:  Social History   Socioeconomic History  . Marital status: Single    Spouse name: Not on file  . Number of children: 0  . Years of education: 3 years college  . Highest education level: Not on file  Occupational History  . Occupation: Retired  Tobacco Use  . Smoking status: Former Smoker    Types: Cigarettes    Quit date: 1980    Years since quitting: 42.0  . Smokeless tobacco: Never Used  Substance and Sexual Activity  . Alcohol use: Yes    Comment: Occasional, maybe 1-2 a month  . Drug use: No  . Sexual activity: Never  Other Topics Concern  . Not on file  Social History Narrative   Lives at home alone.   Right-handed.   Two cups caffeine per day.   Social Determinants of Health   Financial Resource Strain: Not on file  Food Insecurity: Not on file  Transportation Needs: Not on file  Physical Activity: Not on file  Stress: Not on file  Social Connections: Not on file  Intimate Partner Violence: Not on file    Medications:   Current Outpatient Medications on File Prior to Visit  Medication Sig Dispense Refill  . aspirin EC 81 MG EC tablet Take 1 tablet (81 mg total) by mouth daily. 30 tablet 0  . cholecalciferol (VITAMIN D) 1000 units tablet Take 1,000 Units by mouth daily.     . DULoxetine (CYMBALTA) 30 MG capsule TAKE 1 CAPSULE(30 MG) BY MOUTH DAILY 30 capsule 12  . levothyroxine (SYNTHROID) 75 MCG tablet TAKE 1 TABLET EVERY DAY BEFORE BREAKFAST 90 tablet 3  . omeprazole (PRILOSEC) 20 MG capsule TAKE 1 CAPSULE(20 MG) BY MOUTH DAILY 90  capsule 0  . predniSONE (DELTASONE) 5 MG tablet Take 5 mg by mouth daily with breakfast.    . triamterene-hydrochlorothiazide (MAXZIDE-25) 37.5-25 MG tablet TAKE 1 TABLET EVERY DAY 90 tablet 3  . cephALEXin (KEFLEX) 500 MG capsule Take 1 capsule (500 mg total) by mouth 2 (two) times daily. 10 capsule 0  . gabapentin (NEURONTIN) 300 MG capsule 1 tablet three times daily 21 capsule 0  . L-Lysine 1000 MG TABS Take by mouth.     No current facility-administered medications on file prior to visit.    Allergies:   Allergies  Allergen Reactions  . Remicade [Infliximab] Anaphylaxis  . Shellfish-Derived Products Rash and Swelling  . Ciprofloxacin Other (See Comments)    Unknown  . Demerol [Meperidine] Nausea And Vomiting  . Nsaids Other (See Comments)    Unknown  .  Tolmetin Other (See Comments)    Unknown  . Aspirin Rash    Patient states she is able to tolerate aspirin  . Clinoril [Sulindac] Rash  . Codeine Nausea And Vomiting and Other (See Comments)    Severe vomiting  . Lipitor [Atorvastatin Calcium] Rash  . Methotrexate Nausea And Vomiting and Rash  . Methotrexate Derivatives Rash and Cough  . Penicillins Rash and Other (See Comments)    Has patient had a PCN reaction causing immediate rash, facial/tongue/throat swelling, SOB or lightheadedness with hypotension: Unknown Has patient had a PCN reaction causing severe rash involving mucus membranes or skin necrosis: No Has patient had a PCN reaction that required hospitalization: No Has patient had a PCN reaction occurring within the last 10 years: No If all of the above answers are "NO", then may proceed with Cephalosporin use.   . Shellfish Allergy Swelling and Rash    Physical Exam General: Obese middle-aged Caucasian lady seated, in no evident distress Head: head normocephalic and atraumatic.   Neck: supple with no carotid or supraclavicular bruits Cardiovascular: regular rate and rhythm, no murmurs Musculoskeletal:  Bilateral hand metacarpal phalangeal joint deformities from rheumatoid arthritis Skin:  no rash/petichiae Vascular:  Normal pulses all extremities  Neurologic Exam Mental Status: Awake and fully alert. Oriented to place and time. Recent and remote memory intact. Attention span, concentration and fund of knowledge appropriate. Mood and affect appropriate.  Cranial Nerves: Fundoscopic exam not done pupils equal, briskly reactive to light. Extraocular movements full without nystagmus. Visual fields full to confrontation. Hearing intact. Facial sensation intact.  Mild right lower facial asymmetry., tongue, palate moves normally and symmetrically.  Motor: Normal bulk and tone. Normal strength in all tested extremity muscles.  Diminished fine finger movements on the right.  Grips left over right upper extremity.  Mild right grip weakness. Sensory.: intact to touch , pinprick , position and vibratory sensation.  Coordination: Rapid alternating movements normal in all extremities. Finger-to-nose and heel-to-shin performed accurately bilaterally. Gait and Station: Arises from chair without difficulty. Stance is normal. Gait demonstrates normal stride length and balance . Able to heel, toe and tandem walk with slight difficulty.  Reflexes: 1+ and symmetric. Toes downgoing.       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  Vascular risk factors of obesity, hyperlipidemia only     PLAN: I had a long d/w patient about her remote lacunar  stroke, risk for recurrent stroke/TIAs, personally independently reviewed imaging studies and stroke evaluation results and answered questions.Continue aspirin 81 mg daily  for secondary stroke prevention and maintain strict control of hypertension with blood pressure goal below 130/90, diabetes with hemoglobin A1c goal below 6.5% and lipids with LDL cholesterol goal below 70 mg/dL. I also advised  the patient to eat a healthy diet with plenty of whole grains, cereals, fruits and vegetables, exercise regularly and maintain ideal body weight .check screening follow-up carotid ultrasound study as well as lipid profile and hemoglobin A1c.  Followup in the future with me only as necessary and no routine schedule appointment was made.Greater than 50% time during this 25-minute visit was spent on counseling and coordination of care about her lacunar stroke and answering questions  . Angela Heady, MD  [facility] [address] [place], [place] [address]  Phone [phone] Fax [phone] Note: This document was prepared with digital dictation and possible smart phrase technology. Any transcriptional errors that result from  this process are unintentional.

## 2021-01-11 NOTE — Patient Instructions (Signed)
I had a long d/w patient about her remote lacunar  stroke, risk for recurrent stroke/TIAs, personally independently reviewed imaging studies and stroke evaluation results and answered questions.Continue aspirin 81 mg daily  for secondary stroke prevention and maintain strict control of hypertension with blood pressure goal below 130/90, diabetes with hemoglobin A1c goal below 6.5% and lipids with LDL cholesterol goal below 70 mg/dL. I also advised the patient to eat a healthy diet with plenty of whole grains, cereals, fruits and vegetables, exercise regularly and maintain ideal body weight .check screening follow-up carotid ultrasound study as well as lipid profile and hemoglobin A1c.  Followup in the future with me only as necessary and no routine schedule appointment was made

## 2021-01-12 LAB — HEMOGLOBIN A1C
Est. average glucose Bld gHb Est-mCnc: 114 mg/dL
Hgb A1c MFr Bld: 5.6 % (ref 4.8–5.6)

## 2021-01-12 LAB — LIPID PANEL
Chol/HDL Ratio: 3.1 ratio (ref 0.0–4.4)
Cholesterol, Total: 175 mg/dL (ref 100–199)
HDL: 57 mg/dL (ref >39)
LDL Chol Calc (NIH): 101 mg/dL — ABNORMAL HIGH (ref 0–99)
Triglycerides: 96 mg/dL (ref 0–149)
VLDL Cholesterol Cal: 17 mg/dL (ref 5–40)

## 2021-01-16 ENCOUNTER — Ambulatory Visit: Admit: 2021-01-16 | Discharge: 2021-01-17 | Payer: MEDICARE | Attending: Rheumatology | Primary: Rheumatology

## 2021-01-16 DIAGNOSIS — Z7952 Long term (current) use of systemic steroids: Principal | ICD-10-CM

## 2021-01-16 DIAGNOSIS — M0579 Rheumatoid arthritis with rheumatoid factor of multiple sites without organ or systems involvement: Principal | ICD-10-CM

## 2021-01-16 DIAGNOSIS — R300 Dysuria: Principal | ICD-10-CM

## 2021-01-16 LAB — URINALYSIS, MACROSCOPIC
BILIRUBIN UA: NEGATIVE
GLUCOSE UA: NEGATIVE
KETONES UA: NEGATIVE
NITRITE UA: NEGATIVE
PH UA: 7 (ref 5.0–9.0)
PROTEIN UA: NEGATIVE
SPECIFIC GRAVITY UA: 1.01 (ref 1.005–1.030)
UROBILINOGEN UA: 0.2

## 2021-01-16 NOTE — Progress Notes (Signed)
Kindly inform the patient that cholesterol profile shows mild elevation of the bad cholesterol.  I know she developed a rash on Lipitor but if she is willing to try we could try low-dose Crestor 2.5 mg daily.  Screening test for diabetes was satisfactory

## 2021-01-16 NOTE — Unmapped (Signed)
Urine testing today  Bone density   Consultation with Dr. Pierre Bali at a local pharmacy

## 2021-01-16 NOTE — Unmapped (Signed)
RHEUMATOLOGY FOLLOWUP VISIT     Primary care MD: Pamela Mercer    Consulting physician: Dr. Pearlean Mercer (neurology)    HPI: [name] is a 70 yo woman with longstanding RF/CCP+ RA. She returns today for routine followup.  She remains on low dose prednisone 5 mg daily.   She has established a new primary care physician, [name].     Previous therapies have included:  MTX-- D/C for ? Pulmonary toxicity  Enbrel  D/C in 2011 'making her sick'-- Not sure when it was started.  She came to me in 2008 on this.   Orencia (2011-2014) D/C due to cost    Has also taken oral gold, leflunomide, HCQ.  Humira--> injection site reactions.  Remicade--> peritonitis    She reports that she has had multiple bladder infections (e. Coli) over the last year.  She has discussed that with her PCP and it is likely that she will need to see a urologist.  Today she reports recurrence of urinary urgency.  She has had some stress urinary incontinence.  She is waiting for a callback from her PCP.  She has not had fever. She has some mild R sided back pain.     Her joints are feeling well.  She is thinking about making an appointment with Dr, Pamela Mercer.  She has some pain and loss of extension of the L 5th digit.  She does not know how long that has been going on. Of note, she had a L wrist synovectomy many years ago.  She has been reluctant to use disease modifying medications.     She was admitted  fall 2020 with a stroke at [facility].  She had the abrupt onset of R leg and arm weakness.  She did not go to the hospital immediately.  Eventually, on the advice of a family member, she did go.  Imaging demonstrated a small acute ischemic nonhemorrhagic infarct of the posterior left corona radiata. [name], a neurologist.  She is scheduled for followup carotid dopplers.  She has not had any further symptoms.      She is not exercising as she should be and she is considering dietary improvements.      She tells me that last summer she went through a 'pity party' regarding her myriad medical problems.  She is better now.    She volunteers regularly and she is involved in bible study.      She has not had any fever, chills, or night sweats.  No oral ulcers, sicca symptoms.        Medication Sig   ??? aspirin (ECOTRIN) 81 MG tablet Take 81 mg by mouth daily.   ??? cholecalciferol, vitamin D3, (CHOLECALCIFEROL) 1,000 unit tablet Take 1,000 Units by mouth.   ??? DULoxetine (CYMBALTA) 60 MG capsule TAKE 1 CAPSULE BY MOUTH EVERY DAY   ??? levothyroxine (SYNTHROID, LEVOTHROID) 75 MCG tablet TAKE 1 TABLET BY MOUTH EVERY DAY   ??? omeprazole (PRILOSEC) 20 MG capsule TAKE 1 CAPSULE(20 MG) BY MOUTH DAILY   ??? predniSONE (DELTASONE) 5 MG tablet TAKE 1 TO 2 TABLETS EVERY DAY   ??? triamterene-hydrochlorothiazide (MAXZIDE-25) 37.5-25 mg per tablet Take 1 tablets by mouth daily.    ??? FLUVIRIN influenza vaccine, trivalent (32yr+) 2016-17 ADM 0.5ML IM UTD   ??? gabapentin (NEURONTIN) 300 MG capsule Take by mouth Three (3) times a day.   ??? lysine 1,000 mg Tab Take 1 tablet by mouth daily.  Immunization History   Administered Date(s) Administered   ??? COVID-19 VACC,MRNA,(PFIZER)(PF)(IM) 02/12/2020, 03/04/2020, 10/06/2020   ??? Influenza Vaccine Quad (IIV4 PF) 33mo+ injectable 12/03/2016, 09/18/2017, 10/06/2019   ??? Influenza Virus Vaccine, unspecified formulation 10/13/2014, 10/20/2015   ??? PNEUMOCOCCAL POLYSACCHARIDE 23 10/24/2009, 01/10/2015   ??? Pneumococcal Conjugate 13-Valent 07/26/2014   ??? TdaP 11/03/2007, 12/09/2011     Allergies   Allergen Reactions   ??? Infliximab Other (See Comments) and Anaphylaxis     Cough, rash, kidneys pulsing. vomiting   ??? Shellfish Containing Products Rash and Swelling   ??? Nsaids (Non-Steroidal Anti-Inflammatory Drug)      clinoril   ??? Tolmetin    ??? Ciprofloxacin Rash     Other reaction(s): UNKNOWN   ??? Codeine Nausea And Vomiting     Severe vomiting  Severe vomiting   ??? Meperidine Nausea And Vomiting   ??? Methotrexate Nausea And Vomiting and Rash   ??? Penicillins Rash   ??? Sulindac Rash     ROS; 10 systems are reviewed and are negative except that mentioned in the HPI    EXAMINATION  BP 110/82 (BP Site: R Arm, BP Position: Sitting, BP Cuff Size: Large)  - Pulse 100  - Temp 36.5 ??C (97.7 ??F) (Oral)  - Wt 83.6 kg (184 lb 3.2 oz)  - BMI 29.75 kg/m??   General: well-appearing and in no distress  HEENT: Head is normocephalic and atraumatic. Pupils equal round and reactive to light.  Oropharynx is clear.  Neck: supple without JVD, adenopathy, or thyromegaly  Lungs: Clear to A& P  Heart: RRR without murmurs, rubs, or gallops  Abdomen: soft and non-tender  Extremities: no clubbing, cyanosis or edema  Neuro: no focal motor or sensory deficits  Skin: no rashes  MSK: Good ROm of the neck and shoulders.  Chronic synovitis and post surgical changes of most of the MCPs.  Extensor lag of the L 4th and 5th digits.  Knees are cool without synovitis.  ++ crepitus.       IMAGING  DXA March 2018  The bone mineral density in the spine measuring L1 to 4 measures 0.888 gm/cm2. ??The ??Z score is 0.2 and the T score is -1.4. ??This value is above the fracture risk threshold.  The total bone mineral density in the proximal left femur measures 0.777 gm/cm2. ??The Z score is -0.2 and the T score is -1.4. ??This value is above the fracture risk threshold. ??The femoral neck density is 0.700 gm/cm2, and the T score is -1.3. ??The other T scores range from -2.0 to -0.9.    HANDS March 2019  RIGHT: Patient is status post first and second metacarpophalangeal joint arthrodeses. Hardware appears intact. No perihardware lucency or fracture. There is persistent diffuse osteopenia and no significant change in marginal erosions of the second through fourth metacarpophalangeal joints and proximal interphalangeal joints. Interval osseous remodeling at the distal ulna.  LEFT: Unchanged sequelae of first and second arthrodesis with plate and screws with no perihardware lucency or fracture. No significant change in erosion along the radial aspect of the left third metacarpal head. No significant change in multifocal PIP joint space narrowing with marginal erosions. Ulnar styloid process erosions are also unchanged.    FEET March 2019  LEFT: There are marginal osseous erosions at the MTP joints. Additionally there are osseous erosions at the medial head of the first proximal phalanx and base of the first distal phalanx which are new from prior. Similar appearance of joint space narrowing and  lateral angulation/mild subluxation at the second through fourth MTP joints. Joint spaces are otherwise preserved. Multiple, interphalangeal joint flexion deformities. There is a small plantar calcaneal enthesophyte.  RIGHT: Redemonstration of marginal erosions along the MTP joints and distal first phalanx. Unchanged large lucency in the first proximal phalanx, which may represent erosive change. ??Increased lateral angulation deformities at the MTP joints. The joint spaces are otherwise preserved. There is a small plantar calcaneal enthesophyte.    CxR March 2019  Clear lung fields    Assessment and Plan:   Ms. Abram is a 70 year old woman with longstanding seropositive erosive rheumatoid arthritis. She has been managed (her choice) with just low dose prednisone.  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  She has now developed extensor lag of the L 4th and 5th digits.  It is not clear how long this is going on and whether there is anything that can be done at this point.  I have placed a referral to Dr. Jarold Mercer who she has seen in the past (2017).     She will continue prednisone 5 mg daily and prefers not to use DMARDs or biologic therapy.  Her last DXA was in 2018 so she is due for a repeat and that was ordered.     Given her urinary symptoms, I have ordered a UA C&S and will communicate those results to the patient and her PCP.      She will continue followup with Neurology for her cerebrovascular disease.     We reviewed the recommendation for the Shingrix vaccine.       F/U will be in 1 year or sooner as needed.

## 2021-01-17 ENCOUNTER — Telehealth: Payer: Self-pay | Admitting: *Deleted

## 2021-01-17 MED ORDER — SULFAMETHOXAZOLE 800 MG-TRIMETHOPRIM 160 MG TABLET
0 days
Start: 2021-01-17 — End: ?

## 2021-01-17 NOTE — Telephone Encounter (Signed)
Left detailed message about results per Dr. Pearlean Brownie note. Asked her to call office back.

## 2021-01-17 NOTE — Telephone Encounter (Signed)
-----   Message from Micki Riley, MD sent at 01/16/2021  5:05 PM EST ----- Kindly inform the patient that cholesterol profile shows mild elevation of the bad cholesterol.  I know she developed a rash on Lipitor but if she is willing to try we could try low-dose Crestor 2.5 mg daily.  Screening test for diabetes was satisfactory

## 2021-01-23 NOTE — Telephone Encounter (Signed)
Called and spoke with pt about results per Dr. Pearlean Brownie note. Pt verbalized understanding. She does not want to start Crestor at this time. She wants to try diet/exercise first.  She also has not heard about getting scheduled for VAS US Carotid. Advised I will have Annabelle Harman Cox follow up on this and work on getting her scheduled.

## 2021-01-23 NOTE — Telephone Encounter (Signed)
thanks

## 2021-01-24 NOTE — Telephone Encounter (Signed)
I have talked to patient she is scheduled for her Carotid doppler 01/25/2021 arrive at 2:30 for 3:00 .

## 2021-01-25 ENCOUNTER — Other Ambulatory Visit: Payer: Self-pay

## 2021-01-25 ENCOUNTER — Ambulatory Visit (HOSPITAL_COMMUNITY)
Admission: RE | Admit: 2021-01-25 | Discharge: 2021-01-25 | Disposition: A | Discharge status: Home / Self Care | Payer: Medicare PPO | Source: Ambulatory Visit | Attending: Neurology | Admitting: Neurology

## 2021-01-25 DIAGNOSIS — I6990 Unspecified sequelae of unspecified cerebrovascular disease: Secondary | ICD-10-CM | POA: Insufficient documentation

## 2021-01-31 NOTE — Progress Notes (Signed)
Kindly inform the patient that carotid ultrasound study was unremarkable with no major blockages noted

## 2021-02-01 ENCOUNTER — Telehealth: Payer: Self-pay | Admitting: *Deleted

## 2021-02-01 NOTE — Telephone Encounter (Signed)
Called and spoke with pt about results per Dr. Sethi note. Pt verbalized understanding. 

## 2021-02-01 NOTE — Telephone Encounter (Signed)
-----   Message from Micki Riley, MD sent at 01/31/2021  3:42 PM EST ----- Kindly inform the patient that carotid ultrasound study was unremarkable with no major blockages noted

## 2021-02-02 ENCOUNTER — Ambulatory Visit: Admit: 2021-02-02 | Discharge: 2021-02-03 | Payer: MEDICARE

## 2021-02-02 ENCOUNTER — Ambulatory Visit
Admit: 2021-02-02 | Discharge: 2021-02-03 | Payer: MEDICARE | Attending: Orthopaedic Surgery | Primary: Orthopaedic Surgery

## 2021-02-02 DIAGNOSIS — M0579 Rheumatoid arthritis with rheumatoid factor of multiple sites without organ or systems involvement: Principal | ICD-10-CM

## 2021-02-02 DIAGNOSIS — M19032 Primary osteoarthritis, left wrist: Principal | ICD-10-CM

## 2021-02-02 DIAGNOSIS — M069 Rheumatoid arthritis, unspecified: Principal | ICD-10-CM

## 2021-02-02 DIAGNOSIS — Z981 Arthrodesis status: Principal | ICD-10-CM

## 2021-02-02 DIAGNOSIS — M25532 Pain in left wrist: Principal | ICD-10-CM

## 2021-02-02 DIAGNOSIS — G8929 Other chronic pain: Principal | ICD-10-CM

## 2021-02-02 NOTE — Unmapped (Signed)
Contact information for Dr. Norval Gable Team:      Clinic Questions: [phone]   Fax: [phone]     Surgery Scheduler: Vivien Rota   Phone: [phone] (at recording press #3)     Appointment Center: [phone] (at the recording press the number [phone])    Your surgery date:     Your surgery will be scheduled by our surgical scheduling staff within 1-2 business days. If there are any questions or concerns regarding your surgical date, please contact [facility].    Pre-operative testing:    To provide the finest medical care possible, pre-admission testing may be required. If you have a condition such as diabetes, hypertension, cardiac disease, lung disease, or urinary problems, you may be required to have additional diagnostic tests performed. Our anesthesia team reviews your medical records before your surgery, and if they would like to meet with you before surgery, they will call you to set this up.     COVID pre-test:    We are currently testing all patients for COVID-19 preoperatively. Testing should be scheduled 2-3 days prior to the day of surgery. We can schedule this testing for you. Results from tests scheduled at a [facility] facility are available to our surgery teams. Results from tests scheduled outside of the [facility] system will need to be faxed to [phone] AND brought to the surgery center on the day of your surgery. If you are being tested locally, the facility should run a PCR test. Antigen tests are not adequate (due to low sensitivity). PCR tests are our accepted standard. Your surgery is subject to cancellation without pre-procedure COVID-19 testing and results within the above guidelines.    Pre-operative Instructions:    You need to have the full day available on the day of your surgery.  We do not set surgery times. The OR nurses will call you the day before your surgery, typically between 2:00-5:00 pm to inform you of what time to arrive for surgery.  If you have not received a call by 5:00 pm please call them at [phone].     Medications:    You will bleed less during surgery if you stop taking anti-inflammatory medications at least one week before surgery. These include ASPIRIN, IBUPROFEN (Motrin, Advil). NAPROXYN (Naprosyn, Aleve), INDOMETHACIN (Indocin) and several other prescription anti-inflammatories. Please let your doctor know if you are taking any of these medications. Some supplements should also be stopped one week before surgery, such as ginkgo biloba, garlic, ginseng, ginger, dong quai, ephedra, feverfew, St. John's wort and/or omega 3 fatty acids (fish oil).    Surgical Time:    Surgery times are given out from 2:00-5:00pm on the last business day before your scheduled surgery. A preoperative nurse will call you during this time to confirm when you should arrive for surgery. If you have not been called by 5:00pm, you may call [facility] at [phone] or [facility] at [phone]. If you develop symptoms of a cold, the flu, or COVID-19, please contact our office. If you will not be staying at your home address the night before surgery, please call [facility] or [facility] to let them know where you can be reached.     The Night Before Your Surgery:    DO NOT EAT OR DRINK ANYTHING AFTER MIDNIGHT, unless instructed otherwise. If you are diabetic, check with your primary care physician for special instructions concerning your insulin dosage prior to surgery. Take your usual medications as prescribed, unless otherwise instructed.  The Morning of Your Surgery:    We recommend that you wear comfortable, loose fitting clothes that will not be restrictive to your surgical site.  Please leave jewelry, credit cards, cash, and other valuables at home.  Do not wear any piercings, nail polish, make-up, or metal hair clips the day of your surgery.  Contact lenses, glasses, and dentures must be removed before surgery. Please bring a case to store them in during surgery. You may take your usual medications as prescribed with a small sip of water, unless otherwise instructed. Do not drink a full glass of water.     Anesthesia:    The anesthesiologist will discuss your anesthesia with you when you arrive for surgery. Your surgeon and the anesthesiologist will collaborate on the choice of anesthesia for you based on your medical history and the type of surgery you will be having.    Minors:    Any patient under the age of 46 must be accompanied by their parent or legal guardian who must stay at the surgery center from the time of arrival until the time of discharge.     Outpatient Surgery Discharge:    When you are ready to be discharged home from your surgery, a nurse will review your doctor's instructions with you and your family. You must have an adult available to take you home.  It will not be safe for you to drive or take public transportation alone. If you have any questions or concerns once you are home, please do not hesitate to contact us!

## 2021-02-02 NOTE — Unmapped (Signed)
INTERIM HISTORY:  Pamela Mercer is a pleasant 70 year old female who we have previously followed for left thumb and index finger MP fusions done 10/02/2016.  She has RA but is now off all DMARDs and only on 5 mg daily prednisone.  She returns today due to left wrist pain about the distal ulna and DRUJ.  She also notes new inability to extend her left small finger MCP but has preserved motion at the PIP and DIP with difficulty in function.  She is doing well with regard to her previous surgeries.    ROS:  No fevers, chills.  No numbness/tingling.    PHYSICAL EXAM:      General: well appearing 70 year old female in NAD.  Alert and oriented x3, respirations even and unlabored.    Left upper extremity:  Previous skin incisions appear well-healed without any signs of erythema, warmth or fluctuance.  She does have about a 10 degree extensor lag of the small finger MP joint but is able to fully flex and extend at the PIP and DIP of this digit.  When she is able to recruit the other digits, she is able to extend the small finger though she lacks independence small finger MP extension.  When the small finger is isolated, she is unable to really extend from a flexed position.    She is significantly tender to palpation about the distal ulna volarly and the DRUJ.  There is some associated DRUJ instability.  She has intact sensation in all fingers and the dorsum of the hand.  She has warm and well-perfused fingers.  She has ulnar drift of her ulnar digits at the MP joint which is passively correctable.    RADIOGRAPHS:      XR of the left wrist were obtained today and demonstrate inflammatory arthropathy at the radiocarpal and MP joints.  Previous arthrodesis appears stable.    ASSESSMENT:      1.  Suspected clinical rupture of the left EDM tendon  2.  Pain at the DRUJ on the left wrist.  3.  Left thumb and index finger MP fusion, 10/02/16.  4.  Rheumatoid Arthritis.    PLAN:    We discussed her diagnosis and treatment options.  Her biggest complaint is pain at the wrist and dysfunction of the small finger extension.  She does have ulnar drift of the MP joints although it does not seem to be particularly bothersome to her.  In order to address the pain at the distal ulna we discussed proceeding with a left Darrach procedure for distal radial ulnar joint resection.  We will acknowledge the ulnar drift at the MP joints though given that it is well tolerated and asymptomatic we will not plan on addressing it at this time.  We will also perform a synovectomy of the left wrist.  She signed a surgical consent in clinic today.  All questions were answered.  She will follow-up 2 weeks postoperatively.    Surgical plan: Left wrist distal ulna resection with extensor tenosynovectomy.

## 2021-03-13 MED ORDER — OXYCODONE 5 MG TABLET
ORAL_TABLET | ORAL | 0 refills | 4.00000 days | PRN
Start: 2021-03-13 — End: 2021-03-18

## 2021-03-13 MED ORDER — DOCUSATE SODIUM 100 MG CAPSULE
ORAL_CAPSULE | Freq: Two times a day (BID) | ORAL | 0 refills | 30 days
Start: 2021-03-13 — End: 2021-04-12

## 2021-03-13 MED ORDER — ASCORBIC ACID (VITAMIN C) 500 MG TABLET
ORAL_TABLET | Freq: Every day | ORAL | 0 refills | 14 days
Start: 2021-03-13 — End: ?

## 2021-03-13 NOTE — Unmapped (Signed)
INTERIM HISTORY:  Pamela Mercer is a pleasant 70 year old female who we have previously followed for left thumb and index finger MP fusions done 10/02/2016.  She has RA but is now off all DMARDs and only on 5 mg daily prednisone.  She returns today due to left wrist pain about the distal ulna and DRUJ.  She also notes new inability to extend her left small finger MCP but has preserved motion at the PIP and DIP with difficulty in function.  She is doing well with regard to her previous surgeries.    ROS:  No fevers, chills.  No numbness/tingling.    PHYSICAL EXAM:      General: well appearing 70 year old female in NAD.  Alert and oriented x3, respirations even and unlabored.    Left upper extremity:  Previous skin incisions appear well-healed without any signs of erythema, warmth or fluctuance.  She does have about a 10 degree extensor lag of the small finger MP joint but is able to fully flex and extend at the PIP and DIP of this digit.  When she is able to recruit the other digits, she is able to extend the small finger though she lacks independence small finger MP extension.  When the small finger is isolated, she is unable to really extend from a flexed position.    She is significantly tender to palpation about the distal ulna volarly and the DRUJ.  There is some associated DRUJ instability.  She has intact sensation in all fingers and the dorsum of the hand.  She has warm and well-perfused fingers.  She has ulnar drift of her ulnar digits at the MP joint which is passively correctable.    RADIOGRAPHS:      XR of the left wrist were obtained today and demonstrate inflammatory arthropathy at the radiocarpal and MP joints.  Previous arthrodesis appears stable.    ASSESSMENT:      1.  Suspected clinical rupture of the left EDM tendon  2.  Pain at the DRUJ on the left wrist.  3.  Left thumb and index finger MP fusion, 10/02/16.  4.  Rheumatoid Arthritis.    PLAN:    We discussed her diagnosis and treatment options.  Her biggest complaint is pain at the wrist and dysfunction of the small finger extension.  She does have ulnar drift of the MP joints although it does not seem to be particularly bothersome to her.  In order to address the pain at the distal ulna we discussed proceeding with a left Darrach procedure for distal radial ulnar joint resection.  We will acknowledge the ulnar drift at the MP joints though given that it is well tolerated and asymptomatic we will not plan on addressing it at this time.  We will also perform a synovectomy of the left wrist.  She signed a surgical consent in clinic today.  All questions were answered.  She will follow-up 2 weeks postoperatively.    Surgical plan: Left wrist distal ulna resection with extensor tenosynovectomy.

## 2021-03-14 ENCOUNTER — Ambulatory Visit: Admit: 2021-03-14 | Discharge: 2021-03-14 | Payer: MEDICARE

## 2021-03-14 ENCOUNTER — Encounter
Admit: 2021-03-14 | Discharge: 2021-03-14 | Payer: MEDICARE | Attending: Student in an Organized Health Care Education/Training Program | Primary: Student in an Organized Health Care Education/Training Program

## 2021-03-14 MED ORDER — GABAPENTIN 100 MG CAPSULE
ORAL_CAPSULE | Freq: Three times a day (TID) | ORAL | 0 refills | 30 days | Status: CP
Start: 2021-03-14 — End: 2021-04-13

## 2021-03-14 MED ORDER — ACETAMINOPHEN 500 MG CAPSULE
ORAL_CAPSULE | Freq: Three times a day (TID) | ORAL | 0 refills | 0.00000 days | Status: CP
Start: 2021-03-14 — End: ?

## 2021-03-14 MED ORDER — DOCUSATE SODIUM 100 MG CAPSULE
ORAL_CAPSULE | Freq: Two times a day (BID) | ORAL | 0 refills | 30 days | Status: CP
Start: 2021-03-14 — End: 2021-04-13

## 2021-03-14 MED ADMIN — propofoL (DIPRIVAN) injection: INTRAVENOUS | @ 14:00:00 | Stop: 2021-03-14

## 2021-03-14 MED ADMIN — pregabalin (LYRICA) capsule 100 mg: 100 mg | ORAL | @ 14:00:00 | Stop: 2021-03-14

## 2021-03-14 MED ADMIN — bupivacaine (PF) (MARCAINE) 0.5 % (5 mg/mL) injection (PF): PERINEURAL | @ 14:00:00 | Stop: 2021-03-14

## 2021-03-14 MED ADMIN — bupivacaine liposome (PF) (EXPAREL) 133 mg, sodium chloride (NS) 0.9 % 1 mL infiltration injection: 133 mg | @ 14:00:00 | Stop: 2021-03-14

## 2021-03-14 MED ADMIN — ketamine (KETALAR) injection: INTRAVENOUS | @ 15:00:00 | Stop: 2021-03-14

## 2021-03-14 MED ADMIN — midazolam (VERSED) injection: INTRAVENOUS | @ 14:00:00 | Stop: 2021-03-14

## 2021-03-14 MED ADMIN — dexamethasone (DECADRON) 4 mg/mL injection: INTRAVENOUS | @ 15:00:00 | Stop: 2021-03-14

## 2021-03-14 MED ADMIN — clindamycin (CLEOCIN) 900 mg/50 mL IVPB: INTRAVENOUS | @ 15:00:00 | Stop: 2021-03-14

## 2021-03-14 MED ADMIN — sodium chloride irrigation (NS) 0.9 % irrigation solution: @ 15:00:00 | Stop: 2021-03-14

## 2021-03-14 MED ADMIN — lactated Ringers infusion: 10 mL/h | INTRAVENOUS | @ 14:00:00 | Stop: 2021-03-14

## 2021-03-14 MED ADMIN — ondansetron (ZOFRAN) injection: INTRAVENOUS | @ 15:00:00 | Stop: 2021-03-14

## 2021-03-14 MED ADMIN — acetaminophen (TYLENOL) tablet 1,000 mg: 1000 mg | ORAL | @ 14:00:00 | Stop: 2021-03-14

## 2021-03-14 MED ADMIN — lidocaine (XYLOCAINE) 20 mg/mL (2 %) injection: INTRAVENOUS | @ 14:00:00 | Stop: 2021-03-14

## 2021-03-14 NOTE — Unmapped (Signed)
Ambulatory Surgery Nerve Block Instructions and FAQs    As part of your care today, you have received a regional anesthetic (nerve block) to assist with the management of moderate to severe pain after your procedure. As has been explained to you or your responsible person, this procedure was performed to ensure the highest possible degree of comfort and satisfaction, and toreduce the need for opioid pain medications after your surgery at [facility]. Below you will find a list of instructions to help guide you through the next few days with your nerve block, and telephone numbers that you may call at any time for further assistance.    What you need to know about your nerve block:    1. If you have pain, you may take your prescription pain medicine as directed. It will not interact with the medicine administered through your nerve block. The pain pills and the nerve block will work effectively together to reduce your pain.  2. Take your prescribed pain medicine as soon as you start feeling any discomfort in the extremity.  3. Avoid placing cold, hot, hard or sharp items on your numb body part.  Also be careful to not rest your extremity on hard or hot/cold surfaces.  4. If you received a cooling device after surgery it is okay to use it.  5. Be careful not to bang or bump the numb body part. Try to keep it in a neutral, comfortable position.  6. Elevate the limb; this will help decrease swelling and help increase comfort when the block wears off.  7. You may regain some movement or feeling, or experience some breakthrough pain, when the initial strong numbing medicine wears off. This is normal and expected.  8. If you experience any of the following symptoms, please call the number(s) listed below for your anesthesiologist:  o Ringing in the ears  o Lightheadedness  o Numbness or tingling around the lips    If you had an upper extremity nerve block:  1. Be aware of the position of your arm and protect it from compression or other injury.  2. Ensure that your entire arm is supported, including the wrist.  3. Do not let the wrist dangle over the sling.  4. There are some possible side effects that you may experience with your particular block. These side effects will resolve as your block wears off. They include:  ?? hoarse voice  ?? slight redness of the eye  ?? smaller pupil  ?? sagging eyelid  ?? mild shortness of breath (especially if lying down)    If you had a lower extremity nerve block:  1. Be aware of the position of your leg and protect it from compression or other injury.  2. Pad the knee, ankle and heel, and keep the foot and ankle elevated if possible.  3. Do NOT bear any weight on the numb extremity until you completely recover the feeling and full muscle strength.  4. Always seek assistance when getting up and moving about.    Numbers to call for help:    If you have any questiosn about your nerve block or your pain control, if you have any of the symptoms listed above, or if you are so directed by another health care provider, please call or text the physician(s) who placed your nerve block, 24 hours a day at the following numbers:    Resident:      Nelta Numbers MD  Phone: [phone]    Attending: Dr. Youlanda Mighty    Text or call: [phone]      WARNING    As part of your nerve block, you have received liposomal bupivacaine, a very long-acting local anesthetic for pain relief. You must not receive any other local anesthetics containing bupivacaine for 96 hours after administration of liposomal bupivacaine. You should also avoid using other types of lidocaine-containing topical anesthetics (ointments, lidocaine patches, gels or sprays) over the site of the nerve block for 96 hours after administration. The date and time of administration are listed below.       Surgery date: 03/14/21  Time of liposomal bupivacaine administration: 10:00  Site of administration: Supraclavicular block. Neck

## 2021-03-14 NOTE — Unmapped (Signed)
Orthopaedic Surgery Operative Note (CSN: [id number])  Date of Surgery: 03/14/2021  Admit Date: 03/14/2021  Attending Physician: Dianna Rossetti. Jarold Motto      Preoperative Diagnosis: left wrist rheumatoid arthritis    Postoperative Diagnosis: Rheumatoid arthritis involving left wrist, unspecified whether rheumatoid factor present (CMS-HCC) [M06.9]    Procedure:  1.  Left distal ulna resection.  CPT [id number].  2.  Left fourth dorsal compartment tenosynovectomy.  CPT [id number].  3.  Reconstruction left small finger radial sagittal band.  CPT [phone].  4.  Tendon transfer left ECU and pronator quadratus to the dorsal aspect of the distal ulna.  CPT [id number].     Resident Physician(s): Desma Paganini MD.    Anesthesia: Preop Block + General Anesthesia     Antibiotics: Clindamycin IV preoperatively.    Tourniquet time:   Total Tourniquet Time Documented:  Arm  (Left) - 78 minutes  Total: Arm  (Left) - 78 minutes  .    Estimated Blood Loss: Minimal     Complications: None     Specimens: None     Indications for Surgery:    Pamela Mercer is a 70 y.o. female with history of rheumatoid arthritis.  She has developed a painful prominent distal ulna with inability to extend the small finger MP joint who has failed non operative management.  She presents to the operating room today for surgical treatment.  The risks, benefits, alternatives, and complications of the procedure were explained to the patient and their family and all their questions were answered.        Operative Findings:  There was extensive synovitis and joint destruction at the distal ulna.  Distal ulna resection was performed and the distal ulnar stump was stabilized with both a transfer of the ECU and pronator quadratus.  The small finger extensor tendons were noted to be intact though subluxed ulnarly at the MP joint with disruption of the radial sagittal band.  A slip of the EDM tendon distally was used to reconstruct the radial sagittal band and realign the extensor tendons.    Procedure:    The patient was identified in the preoperative holding area where the surgical site was marked with indelible ink. The patient was taken to the OR where a procedural timeout was called and the above noted anesthesia was induced.  Preoperative antibiotics were dosed.  The patient's left upper extremity was prepped and draped in the usual sterile fashion.  Prior to the start of the surgical procedure a second preoperative timeout was called which included verifying the correct patient, the correct operative site, and correct procedure.    We then commenced with our operation.  An Esmarch bandage was used to exsanguinate the limb and the tourniquet was inflated.      A knife was used to make a longitudinal incision over the dorsal aspect of the wrist.  Blunt dissection was carried down through the subcutaneous tissues.  Full-thickness skin flaps were elevated off the retinaculum taking care to protect branches the dorsal radial and ulnar sensory nerves.  The retinaculum was visualized.  It was divided in a Z-lengthening manner over the fourth dorsal compartment.  The tendons of the fourth dorsal compartment were inspected.  There was some mild inflammatory tissue surrounding them and a tenosynovectomy was performed of the fourth dorsal compartment.  The extensor tendons of the small finger were visualized.  These were noted to be intact at the level of the wrist including the EDC and EDM tendons.  I  thus elected to make a second small incision over the dorsal aspect the small finger MP joint.  Knife was used to incise the skin and blunt dissection was carried down through subcutaneous tissues.  Full-thickness skin flaps were elevated off the extensor hood.  The radial sagittal band was noted to be attenuated with ulnar subluxation of the extensor tendon.  The ulnar sagittal band was divided longitudinally so that I could relocate the extensor tendons.  A small slip of the EDM tendon was harvested in a distally based manner.  It was looped around the radial collateral ligament and passed back over to the extensor tendon dorsally.  This resulted in centralization of the extensor tendons.  This was secured to itself using a 3 oh looped Supramid suture.  Traction proximally resulted in nice extension of the small finger MP joint with central location of the extensor tendons.  I then directed my attention back to the wrist.  The ulnar head was noted to be very prominent with extensive synovitis present.  A knife was used to incise the dorsal ulnar capsule and expose the distal aspect of the ulna.  A saw was used to resect the distal ulna approximately 2 cm proximal beveling the dorsal edge subsequent would not be prominent.  The origin of the pronator quadratus was elevated off of the ulna.  A distally based slip of the ECU tendon was harvested.  A 3.2 mm drill was used to make a drill hole through the dorsal cortex of the distal ulna.  The distally based slip of ECU was passed through this drill hole and sutured to itself applying volar pressure to the distal ulna to help stabilize the distal ulnar stump.  The pronator quadratus was also passed dorsal to the distal ulnar stump and sutured to the periosteum and transfer ECU tendon slip.  This resulted in excellent stability of the distal ulnar stump.    The wound was thoroughly irrigated.  The capsule was closed with a 3-0 Vicryl suture over the distal ulna.  The retinaculum was repaired with 3-0 Vicryl suture.  The skin was closed with a 3-0 Monocryl followed by a 4-0 nylon suture.  A sterile dressing was placed followed by a sugar tong splint supporting the MP joints in extension.  The patient was awoken from anesthesia and taken to the PACU in stable condition without complication.     Post-op Plan/Instructions:     The patient will be discharged home. [name]  She will be non weight-bearing on the operative extremity.  No DVT prophylaxis is indicated as the risk of DVT is acceptably low.  Follow up plan will be in 10-14 days for suture removal. .  No x-rays are needed.  We will arrange for her to see hand therapy following her visit with me.  They will fabricate a sugar tong splint in neutral extending past the small finger MP joint to protect her sagittal band reconstruction.  We will plan on having her wear sugar tong splint until 4 weeks postop at which time the may convert to a short arm and begin some forearm rotation.    Teaching Surgeon Attestation: Dianna Rossetti. Jarold Motto was present, scrubbed and an active participant for the entire procedure.      Abram Sander   Date: 03/14/2021  Time: 12:21 PM

## 2021-03-15 NOTE — Unmapped (Signed)
Addendum  created 03/15/21 1126 by Thedore Mins, MD    Clinical Note Signed, Flowsheet accepted

## 2021-03-15 NOTE — Unmapped (Signed)
Addendum  created 03/15/21 1140 by Thedore Mins, MD    Clinical Note Signed, Flowsheet accepted

## 2021-03-30 ENCOUNTER — Ambulatory Visit
Admit: 2021-03-30 | Discharge: 2021-04-28 | Payer: MEDICARE | Attending: Rehabilitative and Restorative Service Providers" | Primary: Rehabilitative and Restorative Service Providers"

## 2021-03-30 ENCOUNTER — Ambulatory Visit
Admit: 2021-03-30 | Discharge: 2021-03-31 | Payer: MEDICARE | Attending: Orthopaedic Surgery | Primary: Orthopaedic Surgery

## 2021-03-30 NOTE — Unmapped (Signed)
OUTPATIENT OCCUPATIONAL THERAPY    UPPER EXTREMITY EVALUATION    Patient Name: Pamela Mercer  Date of Birth:November 22, 1951  Date: 03/30/2021  Visit #: 1  Plan of Care Certification Dates:   Encounter Diagnoses   Name Primary?   ??? Rheumatoid arthritis involving left hand, unspecified whether rheumatoid factor present (CMS-HCC)    ??? Decreased range of motion of finger of left hand Yes   Reason for referral: Please set up to see OT on the same day as Dr. Norval Gable follow up appointment for splint creation and initiation of ROM  Referring Provider: Theodora Blow [facility]*  Onset of Symptoms: sx 03/14/21  Per Referring Provider's note:   Mrs. Scardino is doing quite well postoperatively.  We took her sutures out and applied Steri-Strips.  She will see occupational therapy today for creation of a sugar tong splint holding her forearm in neutral and supporting the small finger MP extension.  She should wear the splint at all times except for hygiene.  Then transition her to a short arm wrist brace from the brace shop range of motion 4 weeks postoperatively.  She may follow-up with [name] again in 1 month.  Communication preference: verbal, written, visual  Prognosis: good due to motivation    OT ASSESSMENT:   70 y.o. year old female with above diagnosis. Patient requires skilled Occupational Therapy services for decreased range of motion, decreased strength, orthotic fit/management, impaired daily activities of living as appropriate.     Previous Level of Function: Pt was previously independent with all ADLs and IADLs.     CURRENT LEVEL OF FUNCTION    Social and Occupational: lives in [place] and love to watch [employer]    Moderate complexity: This patient demonstrates 3-5 performance deficits relating to physical, cognitive and psychosocial skills (see Quick DASH assessment) that result in activity limitations and/or participation restrictions.  This patient may have comorbidities (Rheumatoid arthritis) affecting occupational performance.  Please refer to Current level of function section for further details.     Short Term Goals:  1. In 1 session, patient will perform home exercise program with need for cuing to max IND with ADLs and IADLs. (met)  2. In 1 session, patient will demonstrate independent donning and doffing of orthotic to max joint integrity necessary for ADL completion. (met)  3. In 1 session, patient will verbalize proper care of orthotic and skin to max joint integrity necessary for ADL completion. (met)    Long Term Goals:   1. In 12 weeks, patient will perform upgraded home exercise program, to include progression to strengthening, independently  to max IND with ADLs and IADLs.  2. In 12 weeks, pt will demo full composite fist to max IND with grasp and release of daily living tools.   3. In 12 weeks, pt will demo L forearm rotation greater than or equal to 65/65, respectively, to max her ability to hold a plate.  4. In 12 weeks, pt will demo L elbow ext/flex -30/130 to max her IND with feeding.   5. In 12 weeks, pt will demo L gross grip strength greater than or equal to 20 lbs to max the ability to open a door.   6. In 12 weeks, patient will score less than or equal to 25% impaired on the Quick DASH to demonstrate greater IND in the home environment.      OT  PLAN OF CARE:  Pt will participate in:  Self Care/Hometraining  Orthotic Fit/Management   Therapeutic Exercise  Therapeutic Activity   Neuromuscular Re-education  Ultrasound  Hot/Cold Pack  Electrical Stimulation  Iontophoresis  Orthotic/Prosthetic Measure and Fit   Joint Mobilization  Physical Performance Measure   Manual Therapy    Planned frequency and duration of treatment: 1x / week/ 12 weeks. Plan will be adjusted as necessary.     Patient in agreement with plan of care?: Yes    SUBJECTIVE:    Patient goals:  be able to use my hand    PAST MEDICAL HISTORY:  Reviewed   Past Medical History:   Diagnosis Date   ??? Basal cell carcinoma    ??? Deep vein thrombosis (CMS-HCC)    ??? Hypothyroidism    ??? Joint pain    ??? Squamous cell skin cancer        Past Surgical History: Reviewed  Past Surgical History:   Procedure Laterality Date   ??? Abdominal surgey     ??? CHOLECYSTECTOMY     ??? HAND SURGERY     ??? JOINT REPLACEMENT     ??? Left wrist synovectomy     ??? PR EXC TUM/VASC MAL SFT TISS HAND/FNGR SUBQ <1.5CM Right 11/29/2015    Procedure: EXCISION, TUMOR OR VASCULAR MALFORMATION, SOFT TISSUE OF HAND, SUBCUTANEOUS; LESS THAN 1.5 CM;  Surgeon: Daisy Lazar, MD;  Location: ASC [facility];  Service: Orthopedics   ??? PR EXCIS DISTAL ULNA,PART/COMPLETE Left 03/14/2021    Procedure: EXCISION DISTAL ULNA, PARTIAL/COMPLETE;  Surgeon: Theodora Blow Jacqlyn Krauss, MD;  Location: ASC OR [facility];  Service: Orthopedics   ??? PR EXCIS SYNOV WRIST,EXTENS TENDON Left 03/14/2021    Procedure: SYNOVECTOMY EXTENSOR TENDON SHEATH WRIST SNGL;  Surgeon: Theodora Blow Jacqlyn Krauss, MD;  Location: ASC OR [facility];  Service: Orthopedics   ??? PR FUSION FINGER JOINT Right 11/29/2015    Procedure: ARTHRODESIS, INTERPHALANGEAL JOINT, WITH OR WITHOUT INTERNAL FIXATION--R INDEX;  Surgeon: Theodora Blow Jacqlyn Krauss, MD;  Location: ASC OR [facility];  Service: Orthopedics   ??? PR FUSION MC-P JT Left 10/02/2016    Procedure: ARTHRODESIS, METACARPOPHALANGEAL JOINT, WITH OR WITHOUT INTERNAL FIXATION--L THUMB & INDEX;  Surgeon: Theodora Blow Jacqlyn Krauss, MD;  Location: ASC OR [facility];  Service: Orthopedics   ??? PR RAD EXCIS WRIST SYNOV/TENDON,EXTEN Right 11/29/2015    Procedure: RAD EXC BURSA WRIST; EXTENSORS W/WO TRANSPOSIT;  Surgeon: Daisy Lazar, MD;  Location: ASC OR [facility];  Service: Orthopedics   ??? PR REMOVAL OF IMPLANT FROM HAND/FINGR Right 11/29/2015    Procedure: REMOV IMPLNT FROM FINGER/HAND--R INDEX;  Surgeon: Theodora Blow Jacqlyn Krauss, MD;  Location: ASC OR [facility];  Service: Orthopedics   ??? SKIN BIOPSY         Allergies: Reviewed  Infliximab, Lipitor [atorvastatin], Shellfish containing products, Ciprofloxacin, Codeine, Meperidine, Methotrexate, Nsaids (non-steroidal anti-inflammatory drug), Penicillins, Sulindac, and Tolmetin    Medications: Reviewed    Current Outpatient Medications:   ???  acetaminophen 500 mg coapsule, Take 1,000 mg by mouth every eight (8) hours., Disp: 42 capsule, Rfl: 0  ???  ascorbic acid, vitamin C, (VITAMIN C) 500 MG tablet, Take 1 tablet (500 mg total) by mouth daily., Disp: 14 tablet, Rfl: 0  ???  aspirin (ECOTRIN) 81 MG tablet, Take 81 mg by mouth daily., Disp: , Rfl:   ???  cholecalciferol, vitamin D3, (CHOLECALCIFEROL) 1,000 unit tablet, Take 1,000 Units by mouth., Disp: , Rfl:   ???  docusate sodium (COLACE) 100 MG capsule, Take 1 capsule (100 mg total) by mouth Two (2) times a day., Disp:  60 capsule, Rfl: 0  ???  DULoxetine (CYMBALTA) 60 MG capsule, Take 60 mg by mouth daily., Disp: , Rfl:   ???  gabapentin (NEURONTIN) 100 MG capsule, Take 1 capsule (100 mg total) by mouth Three (3) times a day., Disp: 90 capsule, Rfl: 0  ???  levothyroxine (SYNTHROID, LEVOTHROID) 75 MCG tablet, TAKE 1 TABLET BY MOUTH EVERY DAY, Disp: , Rfl:   ???  omeprazole (PRILOSEC) 20 MG capsule, TAKE 1 CAPSULE(20 MG) BY MOUTH DAILY, Disp: , Rfl:   ???  predniSONE (DELTASONE) 5 MG tablet, TAKE 1 TO 2 TABLETS EVERY DAY, Disp: 180 tablet, Rfl: 3  ???  sulfamethoxazole-trimethoprim (BACTRIM DS) 800-160 mg per tablet, TAKE 1 TABLET BY MOUTH TWICE DAILY FOR 5 DAYS, Disp: , Rfl:   ???  triamterene-hydrochlorothiazide (MAXZIDE-25) 37.5-25 mg per tablet, Take 1 tablet by mouth daily., Disp: , Rfl:     Precautions: no forearm rotation until 4 weeks post-op; No strengthening or weightbearing, orthosis on at all times except to perform change dressings,check skin, exercise    POST-OP HEALING TIMELINE FOR Darrach Procedure, ECU pronator tendon transfer     Date of Surgery: 03/14/21  week 4: transition to neutral wrist orthosis; short arc AROM to wrist and forearm  week 5: gentle A/AROM  week 6: passive motion and wean from the orthosis for light ADLs  week 7: discharge day time orthosis - cont at night  week 8: gentle strengthening  week 9:  week 10: wrist ad elbow strengthening; ; discharge orthosis all together    Prior OT Service: yes    Pain: 0/10    OBJECTIVE    Sensation: intact per pt report    Upper Extremity Function:    Shoulder:  WFL     Elbow:  NT due to precautions    Hand:   Pt is right hand dominant               AROM (degrees) Date:   Right Date:   Left   Wrist   extension/flexion     Supination/pronation     rad/uln deviation     Composite flex to [facility]   (cm lack)     Index     Middle     Ring     Small     Digit Extension     Thumb  Opposition        Gross grip strength (pounds)  Position 2     Pinch strength (pounds)  Lateral  palmar       No objective measures were taken at today's session due to time restraints.  Pt was seen during an non-scheduled appointment time.  Plan to formally assess UE function at next session.     Wound/Incision(s) or Scar: incision covered with steri strips; no drainage    Edema: moderate edema throughout RUE.    Further education and orthotic ft/management provided by Alfonse Alpers, OT.     Orthotic Fit/Management (25 min):  Fabricated L sugartong orthosis, with goal of protecting the healing structures.  Pt educated on splint wearing protocol, including at all times except to check skin.  Pt also educated on the need to perform frequent skin checks to ensure that there is no skin break down.  Pt educated on proper care of orthosis, including cleaning procedures and advising to stay away from heat sources.  Pt advised that if there are any remaining questions or problems regarding  the orthosis, they are encouraged to contact us.  Educated pt on and demonstrated proper donning and doffing of the orthosis.      Home Program:   Apply low to moderate heat 10 min prior to exercises for improved tissue extensibility  AROM of thumb, IF, LF, shoulder    The patient and myself were wearing protective masks and I was wearing protective eyewear during the entirety of the session.     I reviewed the no-show/attendance policy with the patient and caregiver(s). The family is aware that they must call to cancel appointments more than 24 hours in advance. They are also aware that if they late cancel or no-show three times, we reserve the right to cancel their remaining appointments. This policy is in place to allow [name] to best serve the needs of our caseload.    Treatment Rendered:   Orthotic Management/Training: 25 min    Total Evaluation Time: 45 mins    Patient Education:  Topics: home program, disease process  Education Provided to: patient  Education Type: education, demonstration, literature  Response to education/teachback: verbal understanding received, return demonstration    I attest that I have reviewed the above information.  SignedLinwood Dibbles, OT  03/30/2021 12:09 PM

## 2021-03-30 NOTE — Unmapped (Signed)
INTERIM HISTORY:  [name] returns to the status post left Darrach, extensor Tina synovectomy, reconstruction of the left small finger radial sagittal band and DRUJ stabilization with ECU pronator tendon transfer on 03/14/2021.  She has been doing well without major complaints.  Her pain has been well controlled.    PHYSICAL EXAM:      General: Well-appearing 69 year old female in no acute distress.    Left upper extremity: Skin incisions appear to be well-healed without any signs of erythema, warmth or fluctuance.  She is able to fire finger extensors gently, abduct her digits and flex the thumb IP.  She has intact sensation throughout the hand with no numbness.  She is able to make a composite fist with expected limits in the index and thumb from prior fusions.  Fingertips are warm and well-perfused    RADIOGRAPHS: None indicated    ASSESSMENT: Doing well roughly 2 weeks status post left Darrach and small finger extensor tendon realignment on 03/14/21    PLAN:  [name] is doing quite well postoperatively.  We took her sutures out and applied Steri-Strips.  She will see occupational therapy today for creation of a sugar tong splint holding her forearm in neutral and supporting the small finger MP extension.  She should wear the splint at all times except for hygiene.  Then transition her to a short arm wrist brace from the brace shop range of motion 4 weeks postoperatively.  She may follow-up with [name] again in 1 month.

## 2021-04-17 NOTE — Unmapped (Signed)
OUTPATIENT OCCUPATIONAL THERAPY    UPPER EXTREMITY TREATMENT NOTE    Patient Name: Pamela Mercer  Date of Birth:Jul 20, 1951  Date: 04/17/2021  Visit #: 2 total; 2/10 progress   Plan of Care Certification Dates: 4/1-7/1  Encounter Diagnoses   Name Primary?   ??? Rheumatoid arthritis involving left hand, unspecified whether rheumatoid factor present (CMS-HCC) Yes   ??? Decreased range of motion of finger of left hand    Reason for referral: Please set up to see OT on the same day as Dr. Norval Gable follow up appointment for splint creation and initiation of ROM  Referring Provider: Theodora Blow [facility]*  Onset of Symptoms: sx 03/14/21  Per Referring Provider's note:   Pamela Mercer is doing quite well postoperatively.  We took her sutures out and applied Steri-Strips.  She will see occupational therapy today for creation of a sugar tong splint holding her forearm in neutral and supporting the small finger MP extension.  She should wear the splint at all times except for hygiene.  Then transition her to a short arm wrist brace from the brace shop range of motion 4 weeks postoperatively.  She may follow-up with [name] again in 1 month.  Communication preference: verbal, written, visual  Prognosis: good due to motivation    OT ASSESSMENT:   70 y.o. year old female with above diagnosis. Patient requires skilled Occupational Therapy services for decreased range of motion, decreased strength, orthotic fit/management, impaired daily activities of living as appropriate.     Previous Level of Function: Pt was previously independent with all ADLs and IADLs.     CURRENT LEVEL OF FUNCTION    Social and Occupational: lives in [place] and love to watch [employer] basketball    Moderate complexity: This patient demonstrates 3-5 performance deficits relating to physical, cognitive and psychosocial skills (see Quick DASH assessment) that result in activity limitations and/or participation restrictions.  This patient may have comorbidities (Rheumatoid arthritis) affecting occupational performance.  Please refer to Current level of function section for further details.     Short Term Goals:  1. In 1 session, patient will perform home exercise program with need for cuing to max IND with ADLs and IADLs. (met)  2. In 1 session, patient will demonstrate independent donning and doffing of orthotic to max joint integrity necessary for ADL completion. (met)  3. In 1 session, patient will verbalize proper care of orthotic and skin to max joint integrity necessary for ADL completion. (met)    Long Term Goals:   1. In 12 weeks, patient will perform upgraded home exercise program, to include progression to strengthening, independently  to max IND with ADLs and IADLs.  2. In 12 weeks, pt will demo full composite fist to max IND with grasp and release of daily living tools.   3. In 12 weeks, pt will demo L forearm rotation greater than or equal to 65/65, respectively, to max her ability to hold a plate.  4. In 12 weeks, pt will demo L elbow ext/flex -30/130 to max her IND with feeding.   5. In 12 weeks, pt will demo L gross grip strength greater than or equal to 20 lbs to max the ability to open a door.   6. In 12 weeks, patient will score less than or equal to 25% impaired on the Quick DASH to demonstrate greater IND in the home environment.      OT  PLAN OF CARE:  Pt will participate in:  Self Care/Hometraining  Orthotic Fit/Management  Therapeutic Exercise  Therapeutic Activity   Neuromuscular Re-education  Ultrasound  Hot/Cold Pack  Electrical Stimulation  Iontophoresis  Orthotic/Prosthetic Measure and Fit   Joint Mobilization  Physical Performance Measure   Manual Therapy    Planned frequency and duration of treatment: 1x / week/ 12 weeks. Plan will be adjusted as necessary.     Patient in agreement with plan of care?: Yes    SUBJECTIVE:    Patient goals:  be able to use my hand    PAST MEDICAL HISTORY:  Reviewed   Past Medical History:   Diagnosis Date   ??? Basal cell carcinoma    ??? Deep vein thrombosis (CMS-HCC)    ??? Hypothyroidism    ??? Joint pain    ??? Squamous cell skin cancer        Past Surgical History: Reviewed  Past Surgical History:   Procedure Laterality Date   ??? Abdominal surgey     ??? CHOLECYSTECTOMY     ??? HAND SURGERY     ??? JOINT REPLACEMENT     ??? Left wrist synovectomy     ??? PR EXC TUM/VASC MAL SFT TISS HAND/FNGR SUBQ <1.5CM Right 11/29/2015    Procedure: EXCISION, TUMOR OR VASCULAR MALFORMATION, SOFT TISSUE OF HAND, SUBCUTANEOUS; LESS THAN 1.5 CM;  Surgeon: Daisy Lazar, MD;  Location: ASC OR [facility];  Service: Orthopedics   ??? PR EXCIS DISTAL ULNA,PART/COMPLETE Left 03/14/2021    Procedure: EXCISION DISTAL ULNA, PARTIAL/COMPLETE;  Surgeon: Theodora Blow Jacqlyn Krauss, MD;  Location: ASC OR [facility];  Service: Orthopedics   ??? PR EXCIS SYNOV WRIST,EXTENS TENDON Left 03/14/2021    Procedure: SYNOVECTOMY EXTENSOR TENDON SHEATH WRIST SNGL;  Surgeon: Theodora Blow Jacqlyn Krauss, MD;  Location: ASC OR [facility];  Service: Orthopedics   ??? PR FUSION FINGER JOINT Right 11/29/2015    Procedure: ARTHRODESIS, INTERPHALANGEAL JOINT, WITH OR WITHOUT INTERNAL FIXATION--R INDEX;  Surgeon: Theodora Blow Jacqlyn Krauss, MD;  Location: ASC OR [facility];  Service: Orthopedics   ??? PR FUSION MC-P JT Left 10/02/2016    Procedure: ARTHRODESIS, METACARPOPHALANGEAL JOINT, WITH OR WITHOUT INTERNAL FIXATION--L THUMB & INDEX;  Surgeon: Theodora Blow Jacqlyn Krauss, MD;  Location: ASC OR [facility];  Service: Orthopedics   ??? PR RAD EXCIS WRIST SYNOV/TENDON,EXTEN Right 11/29/2015    Procedure: RAD EXC BURSA WRIST; EXTENSORS W/WO TRANSPOSIT;  Surgeon: Daisy Lazar, MD;  Location: ASC OR [facility];  Service: Orthopedics   ??? PR REMOVAL OF IMPLANT FROM HAND/FINGR Right 11/29/2015    Procedure: REMOV IMPLNT FROM FINGER/HAND--R INDEX;  Surgeon: Theodora Blow Jacqlyn Krauss, MD;  Location: ASC OR [facility];  Service: Orthopedics   ??? SKIN BIOPSY Allergies: Reviewed  Infliximab, Lipitor [atorvastatin], Shellfish containing products, Ciprofloxacin, Codeine, Meperidine, Methotrexate, Nsaids (non-steroidal anti-inflammatory drug), Penicillins, Sulindac, and Tolmetin    Medications: Reviewed    Current Outpatient Medications:   ???  acetaminophen 500 mg coapsule, Take 1,000 mg by mouth every eight (8) hours., Disp: 42 capsule, Rfl: 0  ???  ascorbic acid, vitamin C, (VITAMIN C) 500 MG tablet, Take 1 tablet (500 mg total) by mouth daily., Disp: 14 tablet, Rfl: 0  ???  aspirin (ECOTRIN) 81 MG tablet, Take 81 mg by mouth daily., Disp: , Rfl:   ???  cholecalciferol, vitamin D3, (CHOLECALCIFEROL) 1,000 unit tablet, Take 1,000 Units by mouth., Disp: , Rfl:   ???  DULoxetine (CYMBALTA) 60 MG capsule, Take 60 mg by mouth daily., Disp: , Rfl:   ???  gabapentin (NEURONTIN) 100 MG capsule, Take 1  capsule (100 mg total) by mouth Three (3) times a day., Disp: 90 capsule, Rfl: 0  ???  levothyroxine (SYNTHROID, LEVOTHROID) 75 MCG tablet, TAKE 1 TABLET BY MOUTH EVERY DAY, Disp: , Rfl:   ???  omeprazole (PRILOSEC) 20 MG capsule, TAKE 1 CAPSULE(20 MG) BY MOUTH DAILY, Disp: , Rfl:   ???  predniSONE (DELTASONE) 5 MG tablet, TAKE 1 TO 2 TABLETS EVERY DAY, Disp: 180 tablet, Rfl: 3  ???  sulfamethoxazole-trimethoprim (BACTRIM DS) 800-160 mg per tablet, TAKE 1 TABLET BY MOUTH TWICE DAILY FOR 5 DAYS, Disp: , Rfl:   ???  triamterene-hydrochlorothiazide (MAXZIDE-25) 37.5-25 mg per tablet, Take 1 tablet by mouth daily., Disp: , Rfl:     Precautions: no forearm rotation until 4 weeks post-op; No strengthening or weightbearing, orthosis on at all times except to perform change dressings,check skin, exercise    POST-OP HEALING TIMELINE FOR Darrach Procedure, ECU pronator tendon transfer     Date of Surgery: 03/14/21  week 4: transition to neutral wrist orthosis; short arc AROM to wrist and forearm  week 5: gentle A/AROM  week 6: passive motion and wean from the orthosis for light ADLs  week 7: discharge day time orthosis - cont at night  week 8: gentle strengthening  week 9:  week 10: wrist ad elbow strengthening; ; discharge orthosis all together    Prior OT Service: yes    Pain: 0/10    OBJECTIVE    Sensation: intact per pt report    Upper Extremity Function:    Shoulder:  WFL     Elbow:  NT due to precautions    Hand:   Pt is right hand dominant               AROM (degrees) Date:   Right Date:   Left   Wrist   extension/flexion     Supination/pronation     rad/uln deviation     Composite flex to [facility]   (cm lack)     Index     Middle     Ring     Small     Digit Extension     Thumb  Opposition        Gross grip strength (pounds)  Position 2     Pinch strength (pounds)  Lateral  palmar       No objective measures were taken at today's session due to time restraints.  Pt was seen during an non-scheduled appointment time.  Plan to formally assess UE function at next session.     Wound/Incision(s) or Scar: closed     Edema: moderate edema throughout RUE.    Orthotic Fit/Management (30 min):  Fabricated neutral wrist orthosis, with goal of protecting the healing repair while allowing elbow motion.  Pt educated on splint wearing protocol, including at all times except to check skin and exercises.  Pt also educated on the need to perform frequent skin checks to ensure that there is no skin break down.  Pt educated on proper care of orthosis, including cleaning procedures and advising to stay away from heat sources.  Pt advised that if there are any remaining questions or problems regarding  the orthosis, they are encouraged to contact us.  Educated pt on and demonstrated proper donning and doffing of the orthosis.      Self Care/Home Training (15 min):  Reviewed HEP with patient demonstration in the clinic and handouts provided to the patient.  Pt is to focus on the following:  5 repetitions, 3 times per day, 5 second hold of each exercise:    Active wrist extension and flexion   Active radial and ulnar deviation  Active supination and pronation  Tendon gliding  Thumb opposition to all digits    The patient and myself were wearing protective masks and I was wearing protective eyewear during the entirety of the session.     I reviewed the no-show/attendance policy with the patient and caregiver(s). The family is aware that they must call to cancel appointments more than 24 hours in advance. They are also aware that if they late cancel or no-show three times, we reserve the right to cancel their remaining appointments. This policy is in place to allow [name] to best serve the needs of our caseload.    Treatment Rendered:   Orthotic Management/Training: 30 min  Self Care/Home Training: 15 min    Total Evaluation Time: 45 mins    Patient Education:  Topics: home program, disease process  Education Provided to: patient  Education Type: education, demonstration, literature  Response to education/teachback: verbal understanding received, return demonstration    I attest that I have reviewed the above information.  SignedLinwood Dibbles, OT  04/17/2021 2:00 PM

## 2021-04-27 ENCOUNTER — Ambulatory Visit
Admit: 2021-04-27 | Discharge: 2021-04-28 | Payer: MEDICARE | Attending: Orthopaedic Surgery | Primary: Orthopaedic Surgery

## 2021-04-27 NOTE — Unmapped (Signed)
OUTPATIENT OCCUPATIONAL THERAPY    UPPER EXTREMITY TREATMENT NOTE    Patient Name: Pamela Mercer  Date of Birth:1951/05/30  Date: 04/27/2021  Visit #: 3 total; 310 progress   Plan of Care Certification Dates: 4/1-7/1  Encounter Diagnoses   Name Primary?   ??? Decreased range of motion of finger of left hand Yes   ??? Rheumatoid arthritis involving left hand, unspecified whether rheumatoid factor present (CMS-HCC)    Reason for referral: Please set up to see OT on the same day as Dr. Norval Gable follow up appointment for splint creation and initiation of ROM  Referring Provider: Theodora Blow [facility]*  Onset of Symptoms: sx 03/14/21  Per Referring Provider's note:   Mrs. Lowden is doing quite well postoperatively.  We took her sutures out and applied Steri-Strips.  She will see occupational therapy today for creation of a sugar tong splint holding her forearm in neutral and supporting the small finger MP extension.  She should wear the splint at all times except for hygiene.  Then transition her to a short arm wrist brace from the brace shop range of motion 4 weeks postoperatively.  She may follow-up with [name] again in 1 month.  Communication preference: verbal, written, visual  Prognosis: good due to motivation    OT ASSESSMENT:   70 y.o. female with above diagnosis. [name] requires skilled Occupational Therapy services for decreased range of motion, decreased strength, orthotic fit/management, impaired daily activities of living as appropriate. She is in week 6 post op, and range of motion of the LUE is comparable to that of her RUE. She has already begun removing the splint for periods of each day.    Previous Level of Function: Pt was previously independent with all ADLs and IADLs.     CURRENT LEVEL OF FUNCTION    Social and Occupational: lives in [place] and love to watch [employer] basketball      Short Term Goals:  1. In 1 session, patient will perform home exercise program with need for cuing to max IND with ADLs and IADLs. (met)  2. In 1 session, patient will demonstrate independent donning and doffing of orthotic to max joint integrity necessary for ADL completion. (met)  3. In 1 session, patient will verbalize proper care of orthotic and skin to max joint integrity necessary for ADL completion. (met)    Long Term Goals:   1. In 12 weeks, patient will perform upgraded home exercise program, to include progression to strengthening, independently  to max IND with ADLs and IADLs.  2. In 12 weeks, pt will demo full composite fist to max IND with grasp and release of daily living tools.   3. In 12 weeks, pt will demo L forearm rotation greater than or equal to 65/65, respectively, to max her ability to hold a plate.  4. In 12 weeks, pt will demo L elbow ext/flex -30/130 to max her IND with feeding.   5. In 12 weeks, pt will demo L gross grip strength greater than or equal to 20 lbs to max the ability to open a door.   6. In 12 weeks, patient will score less than or equal to 25% impaired on the Quick DASH to demonstrate greater IND in the home environment.      OT  PLAN OF CARE:  Pt will participate in: continue working towards stated goals in plan of care     Planned frequency and duration of treatment: 1x / week/ 12 weeks. Plan will be adjusted  as necessary.     Patient in agreement with plan of care?: Yes    SUBJECTIVE:    Patient goals:  be able to use my hand    Precautions: no strengthening until 8 weeks post op     POST-OP HEALING TIMELINE FOR Darrach Procedure, ECU pronator tendon transfer     Date of Surgery: 03/14/21  week 4: transition to neutral wrist orthosis; short arc AROM to wrist and forearm  week 5: gentle A/AROM  week 6: passive motion and wean from the orthosis for light ADLs  week 7: discharge day time orthosis - cont at night  week 8: gentle strengthening  week 9:  week 10: wrist ad elbow strengthening; ; discharge orthosis all together    Prior OT Service: yes    Pain: only when I try to move it, but not that bad    OBJECTIVE    Sensation: intact per pt report    Upper Extremity Function:    Shoulder:  WFL     Elbow:  NT due to precautions    Hand:   Pt is right hand dominant               AROM (degrees) Date: 4/29  Right Date: 4/29  Left   Wrist   extension/flexion 45/20 39/35   Supination/pronation  80/83   rad/uln deviation 12/7 14/11   Composite flex to DPC   (cm lack)     Index fusion fusion   Middle 4 3   Ring 3 3   Small 3 4.75   Digit Extension     Thumb  Opposition   Intact all digits      Gross grip strength (pounds)  Position 2     Pinch strength (pounds)  Lateral  palmar         Wound/Incision(s) or Scar: closed     Edema: moderate edema throughout RUE.    Visit Summary:     Orthotic Fit/Management (15 min):  Modified pt's neutral wrist orthosis in several areas for improved fit and comfort. Instructed pt to begin removing some of each day for light performance of daily tasks around the house. Instructed her to continue wearing it when out and about as well as overnight for now.      Self Care/Home Training (30 min):  Reviewed HEP with pt return demo and measures taken   Pt is 6 weeks 3 days post op    5 repetitions, 3 times per day, 5 second hold of each exercise:    Active wrist extension and flexion   Active radial and ulnar deviation  Active supination and pronation  Tendon gliding  Thumb opposition to all digits    Finger extension off table top       The patient and myself were wearing protective masks and I was wearing protective eyewear during the entirety of the session.     I reviewed the no-show/attendance policy with the patient and caregiver(s). The family is aware that they must call to cancel appointments more than 24 hours in advance. They are also aware that if they late cancel or no-show three times, we reserve the right to cancel their remaining appointments. This policy is in place to allow [name] to best serve the needs of our caseload.    Treatment Rendered:   Orthotic Management/Training: 15 min  Self Care/Home Training: 30 min    Total Treatment Time: 45 mins    Patient Education:  Topics: home  program, disease process  Education Provided to: patient  Education Type: education, demonstration, literature  Response to education/teachback: verbal understanding received, return demonstration    I attest that I have reviewed the above information.  Signed: Shelba Flake, OT  04/27/2021 9:52 AM

## 2021-04-27 NOTE — Unmapped (Signed)
INTERIM HISTORY:  Pamela Mercer returns to the status post left Darrach, extensor tenosynovectomy, reconstruction of the left small finger radial sagittal band and DRUJ stabilization with ECU pronator tendon transfer on 03/14/2021.  She has been doing well since her last visit.  She notes that she did have a flare of her rheumatoid after her surgery which is starting to resolve.    PHYSICAL EXAM:      General: Well-appearing 70 year old female in no acute distress.    Left upper extremity: Skin incisions appear to be well-healed with the exception of a small area in the midportion that has some clean granulation tissue present.  No surrounding erythema or drainage.  Resting posture of her fingers looks good.  She has some stiffness across the small and ring finger MP joints in flexion as would be expected.  She has intact sensation throughout the hand with no numbness.  Fingertips are warm and well-perfused    RADIOGRAPHS: None indicated    ASSESSMENT:   1.  Left Darrach and small finger extensor tendon realignment on 03/14/21  2.  Left thumb and index finger MP fusion, 10/02/16.  3.  Rheumatoid Arthritis.    PLAN:  Pamela Mercer is doing quite well postoperatively.  She will continue working with therapy on range of motion with progression to strengthening.  I will plan on seeing her back in about 4 to 6 weeks for final clinical evaluation.  If she is doing well she may cancel that visit.

## 2021-05-07 ENCOUNTER — Ambulatory Visit
Admit: 2021-05-07 | Discharge: 2021-05-28 | Payer: MEDICARE | Attending: Rehabilitative and Restorative Service Providers" | Primary: Rehabilitative and Restorative Service Providers"

## 2021-05-07 ENCOUNTER — Ambulatory Visit: Admit: 2021-05-07 | Discharge: 2021-05-28 | Payer: MEDICARE

## 2021-05-07 NOTE — Unmapped (Signed)
OUTPATIENT OCCUPATIONAL THERAPY    UPPER EXTREMITY TREATMENT NOTE    Patient Name: Pamela Mercer  Date of Birth:[birth year]  Date: 05/07/2021  Visit #: 4 total; 4/10 progress   Plan of Care Certification Dates: 4/1-7/1  Encounter Diagnoses   Name Primary?   ??? Decreased range of motion of finger of left hand Yes   ??? Rheumatoid arthritis involving left hand, unspecified whether rheumatoid factor present (CMS-HCC)    Reason for referral: Please set up to see OT on the same day as Dr. Norval Gable follow up appointment for splint creation and initiation of ROM  Referring Provider: Theodora Blow [facility]*  Onset of Symptoms: sx 03/14/21  Per Referring Provider's note:   ASSESSMENT:   1.  Left Darrach and small finger extensor tendon realignment on 03/14/21  2.  Left thumb and index finger MP fusion, 10/02/16.  3.  Rheumatoid Arthritis.  PLAN:  Pamela Mercer is doing quite well postoperatively.  She will continue working with therapy on range of motion with progression to strengthening.  I will plan on seeing her back in about 4 to 6 weeks for final clinical evaluation.  If she is doing well she may cancel that visit.  Communication preference: verbal, written, visual  Prognosis: good due to motivation    OT ASSESSMENT:   Pamela Mercer demonstrates functional LUE ROM however is limited by decreased gross LUE strength as limited by the protocol . Pamela Mercer requires skilled Occupational Therapy services for decreased range of motion, decreased strength, orthotic fit/management, impaired daily activities of living as appropriate.     Previous Level of Function: Pt was previously independent with all ADLs and IADLs.     CURRENT LEVEL OF FUNCTION    Social and Occupational: lives in [place] and love to watch [employer] basketball    Short Term Goals:  1. In 1 session, patient will perform home exercise program with need for cuing to max IND with ADLs and IADLs. (met)  2. In 1 session, patient will demonstrate independent donning and doffing of orthotic to max joint integrity necessary for ADL completion. (met)  3. In 1 session, patient will verbalize proper care of orthotic and skin to max joint integrity necessary for ADL completion. (met)    Long Term Goals:   1. In 12 weeks, patient will perform upgraded home exercise program, to include progression to strengthening, independently  to max IND with ADLs and IADLs.  2. In 12 weeks, pt will demo full composite fist to max IND with grasp and release of daily living tools.   3. In 12 weeks, pt will demo L forearm rotation greater than or equal to 65/65, respectively, to max her ability to hold a plate.  4. In 12 weeks, pt will demo L elbow ext/flex -30/130 to max her IND with feeding.   5. In 12 weeks, pt will demo L gross grip strength greater than or equal to 20 lbs to max the ability to open a door.   6. In 12 weeks, patient will score less than or equal to 25% impaired on the Quick DASH to demonstrate greater IND in the home environment.      OT  PLAN OF CARE:  Pt will participate in: continue working towards stated goals in plan of care     Planned frequency and duration of treatment: 1x / week/ 12 weeks. Plan will be adjusted as necessary.     Patient in agreement with plan of care?: Yes    SUBJECTIVE: My hand  is doing pretty well    Patient goals:  be able to use my hand    Precautions: no strengthening until 8 weeks post op     POST-OP HEALING TIMELINE FOR Darrach Procedure, ECU pronator tendon transfer     Date of Surgery: 03/14/21  week 4: transition to neutral wrist orthosis; short arc AROM to wrist and forearm  week 5: gentle A/AROM  week 6: passive motion and wean from the orthosis for light ADLs  week 7: discharge day time orthosis - cont at night  week 8: gentle strengthening  week 9:  week 10: wrist and elbow strengthening; only isometric forearm strengthening; discharge orthosis all together    Prior OT Service: yes    Pain: only when I try to move it, but not that bad    OBJECTIVE    Sensation: intact per pt report    Upper Extremity Function:    Shoulder:  WFL     Elbow:  NT due to precautions    Hand:   Pt is right hand dominant               AROM (degrees) Date: 4/29  Right Date: 4/29  Left 5/9  left   Wrist   extension/flexion 45/20 39/35 45/20    Supination/pronation  80/83    rad/uln deviation 12/7 14/11    Composite flex to [facility]   (cm lack)      Index fusion fusion function   Middle 4 3 2    Ring 3 3 1.5   Small 3 4.75 2.5   Digit Extension      Thumb  Opposition   Intact all digits       Gross grip strength (pounds)  Position 2 05/07/21  L: 12.5  R: 11    Pinch strength (pounds)  Lateral  palmar R lateral: 8  L lateral: 7        Wound/Incision(s) or Scar: closed     Edema: moderate edema throughout RUE.    Visit Summary:     Manual Therapy (15 min):  Moist heat applied to wrist/hand x 5 min (untimed) to prep tissue for increased extensibility in preparation for exercise.   Gentle, passive stretching with prolonged hold performed to all digits except IF (which is fused) for greater digit motion.     Self Care/Home Training (15 min):  Updated HEP to include:   Reviewed HEP with pt return demo and measures taken   Pt is 6 weeks 3 days post op    5 repetitions, 3 times per day, 5 second hold of each exercise:    Passive finger flexion (except IF)  Tendon gliding  Gross grip strengthening and scapular strengthening with theraband    The patient and myself were wearing protective masks and I was wearing protective eyewear during the entirety of the session.     I reviewed the no-show/attendance policy with the patient and caregiver(s). The family is aware that they must call to cancel appointments more than 24 hours in advance. They are also aware that if they late cancel or no-show three times, we reserve the right to cancel their remaining appointments. This policy is in place to allow [name] to best serve the needs of our caseload.    Treatment Rendered:   Manual Therapy Techniques: 15 min  Self Care/Home Training: 15 min    Total Treatment Time: 30 mins    Patient Education:  Topics: home program, disease process  Education Provided to:  patient  Education Type: education, demonstration, literature  Response to education/teachback: verbal understanding received, return demonstration    I attest that I have reviewed the above information.  SignedLinwood Dibbles, OT  05/07/2021 10:36 AM

## 2021-05-24 NOTE — Unmapped (Signed)
OUTPATIENT OCCUPATIONAL THERAPY    UPPER EXTREMITY DISCHARGE NOTE    Patient Name: Pamela Mercer  Date of Birth:1951/06/07  Date: 05/24/2021  Visit #: 5 total; 5/10 progress   Plan of Care Certification Dates: 4/1-7/1  Encounter Diagnoses   Name Primary?   ??? Decreased range of motion of finger of left hand Yes   ??? Rheumatoid arthritis involving left hand, unspecified whether rheumatoid factor present (CMS-HCC)    Reason for referral: Please set up to see OT on the same day as Dr. Norval Gable follow up appointment for splint creation and initiation of ROM  Referring Provider: Theodora Blow [facility]*  Onset of Symptoms: sx 03/14/21  Per Referring Provider's note:   ASSESSMENT:   1.  Left Darrach and small finger extensor tendon realignment on 03/14/21  2.  Left thumb and index finger MP fusion, 10/02/16.  3.  Rheumatoid Arthritis.  PLAN:  Pamela Mercer is doing quite well postoperatively.  She will continue working with therapy on range of motion with progression to strengthening.  I will plan on seeing her back in about 4 to 6 weeks for final clinical evaluation.  If she is doing well she may cancel that visit.  Communication preference: verbal, written, visual  Prognosis: good due to motivation    OT ASSESSMENT:   Pamela Mercer has met 6/6 of her long term goals and is independent with her HEP.     Previous Level of Function: Pt was previously independent with all ADLs and IADLs.     CURRENT LEVEL OF FUNCTION    Social and Occupational: lives in [place] and love to watch [employer] basketball    Short Term Goals:  1. In 1 session, patient will perform home exercise program with need for cuing to max IND with ADLs and IADLs. (met)  2. In 1 session, patient will demonstrate independent donning and doffing of orthotic to max joint integrity necessary for ADL completion. (met)  3. In 1 session, patient will verbalize proper care of orthotic and skin to max joint integrity necessary for ADL completion. (met)    Long Term Goals:   1. In 12 weeks, patient will perform upgraded home exercise program, to include progression to strengthening, independently  to max IND with ADLs and IADLs. (met)  2. In 12 weeks, pt will demo full composite fist to max IND with grasp and release of daily living tools. (met, although IF MP is fused)   3. In 12 weeks, pt will demo L forearm rotation greater than or equal to 65/65, respectively, to max her ability to hold a plate. (met)   4. In 12 weeks, pt will demo L elbow ext/flex -30/130 to max her IND with feeding. (not measured, however observed to to be functional)   5. In 12 weeks, pt will demo L gross grip strength greater than or equal to 20 lbs to max the ability to open a door. (met)  6. In 12 weeks, patient will score less than or equal to 25% impaired on the Quick DASH to demonstrate greater IND in the home environment. (not tested, however pt reports LUE is functional)     OT  PLAN OF CARE:  Pt will participate in: continue working towards stated goals in plan of care     Planned frequency and duration of treatment: No further outpatient OT is recommended due to patient being independent with HEP.     Patient in agreement with plan of care?: Yes    SUBJECTIVE:  Patient  reports she has not been doing her exercises recently and feels like something is clicking     Patient goals:  be able to use my hand    Precautions: no strengthening until 8 weeks post op     POST-OP HEALING TIMELINE FOR Darrach Procedure, ECU pronator tendon transfer     Date of Surgery: 03/14/21  week 4: transition to neutral wrist orthosis; short arc AROM to wrist and forearm  week 5: gentle A/AROM  week 6: passive motion and wean from the orthosis for light ADLs  week 7: discharge day time orthosis - cont at night  week 8: gentle strengthening  week 9:  week 10: wrist and elbow strengthening; only isometric forearm strengthening; discharge orthosis all together    Prior OT Service: yes    Pain: 0/10 at rest      OBJECTIVE    Sensation: intact per pt report    Upper Extremity Function:    Shoulder:  WFL     Elbow:  NT due to precautions    Hand:   Pt is right hand dominant               AROM (degrees) Date: 4/29  Right Date: 4/29  Left 5/9  left 05/24/21  Left   Wrist   extension/flexion 45/20 39/35 45/20  40/40    Supination/pronation  80/83     rad/uln deviation 12/7 14/11     Composite flex to [facility]   (cm lack)       Index fusion fusion function fusion   Middle 4 3 2  Full    Ring 3 3 1.5 Full    Small 3 4.75 2.5 Full    Digit Extension       Thumb  Opposition   Intact all digits        Gross grip strength (pounds)  Position 2 05/07/21  L: 12.5  R: 11 05/24/21  L: 26    Pinch strength (pounds)  Lateral  palmar R lateral: 8  L lateral: 7        Wound/Incision(s) or Scar: closed     Edema: moderate edema throughout RUE.    Visit Summary:     Manual Therapy (15 min):  Moist heat applied to wrist/hand x 5 min (untimed) to prep tissue for increased extensibility in preparation for exercise.   Gentle, passive stretching with prolonged hold performed to all digits except IF (which is fused) for greater digit motion.     Self Care/Home Training (30 min)  Educated patient and patient demonstrated the following updated HEP.  The updated HEP includes the following exercises:     5 repetitions, 2 times per week, 5 second hold of each exercise:    Gross grip strengthening with min resistance Thera putty   Isometric hold in a neutral position with a hammer     10 repetitions, 2 times per week     Bicep curls with a 1 lb weight   Shoulder flexion with a 1 lb weight    The patient and myself were wearing protective masks and I was wearing protective eyewear during the entirety of the session.     I reviewed the no-show/attendance policy with the patient and caregiver(s). The family is aware that they must call to cancel appointments more than 24 hours in advance. They are also aware that if they late cancel or no-show three times, we reserve the right to cancel their remaining appointments. This policy is in place  to allow [name] to best serve the needs of our caseload.    Treatment Rendered:   Manual Therapy Techniques: 15 min  Self Care/Home Training: 30 min    Total Treatment Time: 45 mins    Patient Education:  Topics: home program, disease process  Education Provided to: patient  Education Type: education, demonstration, literature  Response to education/teachback: verbal understanding received, return demonstration    I attest that I have reviewed the above information.  Signed: Forde Radon  05/24/2021 9:59 AM

## 2021-06-08 ENCOUNTER — Ambulatory Visit
Admit: 2021-06-08 | Discharge: 2021-06-09 | Payer: MEDICARE | Attending: Orthopaedic Surgery | Primary: Orthopaedic Surgery

## 2021-06-08 NOTE — Unmapped (Signed)
INTERIM HISTORY:  Pamela Mercer returns to the status post left Darrach, extensor tenosynovectomy, reconstruction of the left small finger radial sagittal band and DRUJ stabilization with ECU pronator tendon transfer on 03/14/2021.  She has been doing well since her last visit.  She notes that she did have a flare of her rheumatoid after her surgery though this has resolved.  She denies any pain.    PHYSICAL EXAM:      General: Well-appearing 70 year old female in no acute distress.    Left upper extremity: Skin incisions appear to be well-healed.  Resting posture of her fingers looks good.  She has full extension of the small finger MP joint as well as full flexion.  She has intact sensation throughout the hand with no numbness.  Fingertips are warm and well-perfused    RADIOGRAPHS: None indicated    ASSESSMENT:   1.  Left Darrach and small finger extensor tendon realignment on 03/14/21  2.  Left thumb and index finger MP fusion, 10/02/16.  3.  Rheumatoid Arthritis.    PLAN:  Pamela Mercer is doing quite well postoperatively.  She may continue to increase her activities as she tolerates.  She will return on an as-needed basis.

## 2021-10-15 DIAGNOSIS — N3000 Acute cystitis without hematuria: Principal | ICD-10-CM

## 2021-12-27 DIAGNOSIS — Z7952 Long term (current) use of systemic steroids: Principal | ICD-10-CM

## 2021-12-27 NOTE — Unmapped (Signed)
Reason for call: PT called in requesting if orders for a Bone Dex could please be placed for [facility]?     Thanks      Last ov: Visit date not found  Next ov: 01/08/2022

## 2022-01-03 ENCOUNTER — Ambulatory Visit: Admit: 2022-01-03 | Discharge: 2022-01-04 | Payer: MEDICARE

## 2022-01-08 ENCOUNTER — Telehealth: Admit: 2022-01-08 | Payer: MEDICARE | Attending: Rheumatology | Primary: Rheumatology

## 2022-01-08 MED ORDER — PREDNISONE 5 MG TABLET
ORAL_TABLET | Freq: Every day | ORAL | 3 refills | 180 days | Status: CP
Start: 2022-01-08 — End: ?

## 2022-01-08 NOTE — Unmapped (Signed)
RHEUMATOLOGY FOLLOWUP VISIT VIA VIDEO    Primary care MD: Dr. Kandyce Rud    Consulting physician: Dr. Pearlean Brownie (neurology)    HPI: [name] is a  71 y.o. woman with longstanding RF/CCP+ RA. She returns today for routine followup via Telehealth.  She remains on low dose prednisone 5 mg daily.   She has not been on DMARDs for many years and she prefers to remain off.       Previous therapies have included:  MTX-- D/C for ? Pulmonary toxicity  Enbrel  D/C in 2011 'making her sick'-- Not sure when it was started.  She came to me in 2008 on this.   Orencia (2011-2014) D/C due to cost    Has also taken oral gold, leflunomide, HCQ.  Humira--> injection site reactions.  Remicade--> peritonitis    Since her last visit with me she has undergone left wrist distal ulna resection with extensor tenosynovectomy with Dr. Jarold Motto. The surgery was uncomplicated and she has had a good result.  Ogechi tells me that she notes weakness of the hand overall.  Notes indicate that she had a flare of RA in the perioperative period. She increased the prednisone up to 60 mg and tapered off over a week or so.      She is seeing Urology tomorrow to address recurrent UTIs.  She is scheduled for a bone density in [facility] upcoming.     She had both flu and COVID during the recent holiday season and she is now completely recovered.      She denies any current joint pain or stiffness.      She tells me that she is planning to move in with her sister and brother in law.  They are her primary caregivers when she is sick so that made sense.  Once the renovations are done on her sister's house, she will move in and sell her current home.       ROS; 10 systems are reviewed and are negative except that mentioned in the HPI    Medication Sig   ??? acetaminophen 500 mg coapsule Take 1,000 mg by mouth every eight (8) hours.   ??? ascorbic acid, vitamin C, (VITAMIN C) 500 MG tablet Take 1 tablet (500 mg total) by mouth daily.   ??? aspirin (ECOTRIN) 81 MG tablet Take 81 mg by mouth daily.   ??? cholecalciferol, vitamin D3, (CHOLECALCIFEROL) 1,000 unit tablet Take 1,000 Units by mouth.   ??? DULoxetine (CYMBALTA) 60 MG capsule Take 60 mg by mouth daily.   ??? levothyroxine (SYNTHROID, LEVOTHROID) 75 MCG tablet TAKE 1 TABLET BY MOUTH EVERY DAY   ??? omeprazole (PRILOSEC) 20 MG capsule TAKE 1 CAPSULE(20 MG) BY MOUTH DAILY   ??? predniSONE (DELTASONE) 5 MG tablet Take 1 tablet (5 mg total) by mouth daily.   ??? triamterene-hydrochlorothiazide (MAXZIDE-25) 37.5-25 mg per tablet Take 1 tablet by mouth daily.           Immunization History   Administered Date(s) Administered   ??? COVID-19 VACC,MRNA,(PFIZER)(PF)(IM) 02/12/2020, 03/04/2020, 10/06/2020   ??? Influenza Vaccine Quad (IIV4 PF) 73mo+ injectable 12/03/2016, 09/18/2017, 10/06/2019   ??? Influenza Virus Vaccine, unspecified formulation 10/13/2014, 10/20/2015   ??? PNEUMOCOCCAL POLYSACCHARIDE 23 10/24/2009, 01/10/2015   ??? Pneumococcal Conjugate 13-Valent 07/26/2014   ??? TdaP 11/03/2007, 12/09/2011     Allergies   Allergen Reactions   ??? Infliximab Other (See Comments) and Anaphylaxis     Cough, rash, kidneys pulsing. vomiting   ??? Shellfish Containing Products  Rash and Swelling   ??? Nsaids (Non-Steroidal Anti-Inflammatory Drug)      clinoril   ??? Tolmetin    ??? Ciprofloxacin Rash     Other reaction(s): UNKNOWN   ??? Codeine Nausea And Vomiting     Severe vomiting  Severe vomiting   ??? Meperidine Nausea And Vomiting   ??? Methotrexate Nausea And Vomiting and Rash   ??? Penicillins Rash   ??? Sulindac Rash     ROS; 10 systems are reviewed and are negative except that mentioned in the HPI      IMAGING  DXA March 2018  The bone mineral density in the spine measuring L1 to 4 measures 0.888 gm/cm2. ??The ??Z score is 0.2 and the T score is -1.4. ??This value is above the fracture risk threshold.  The total bone mineral density in the proximal left femur measures 0.777 gm/cm2. ??The Z score is -0.2 and the T score is -1.4. ??This value is above the fracture risk threshold. ??The femoral neck density is 0.700 gm/cm2, and the T score is -1.3. ??The other T scores range from -2.0 to -0.9.    L WRIST 2022  Diffuse osteopenia. Unchanged first and second MCP arthrodesis. Hardware is without fracture. There is no surrounding lucency to suggest loosening or infection. Progress joint space narrowing and erosive changes noted at the wrist and metacarpophalangeal joints with slight increased erosive changes also noted at the ulnar styloid. Mild thumb base osteoarthrosis.      Assessment and Plan:   Ms. Geidel is a 71 year old woman with longstanding seropositive erosive rheumatoid arthritis. She has been managed (her choice) with just low dose prednisone.  At the last visit she had extensor lag of the L 4th and 5th digits and she has now undergone distal ulna resection and tenosynovectomy.  She notes weakness of the hand today.      She will continue prednisone 5 mg daily and prefers not to use DMARDs or biologic therapy.  Her last DXA was in 2018 so she is due for a repeat and that is scheduled upcoming.      She will see urology for recurrent UTIs.       F/U will be in 1 year or sooner as needed.           The patient reports they are currently: at home. I spent 20 minutes on the real-time audio and video visit with the patient on the date of service. I spent an additional 10 minutes on pre- and post-visit activities on the date of service.     The patient was not located and I was not located within 250 yards of a hospital based location during the real-time audio and video visit. The patient was physically located in [place] or a state in which I am permitted to provide care. The patient and/or parent/guardian understood that s/he may incur co-pays and cost sharing, and agreed to the telemedicine visit. The visit was reasonable and appropriate under the circumstances given the patient's presentation at the time.    The patient and/or parent/guardian has been advised of the potential risks and limitations of this mode of treatment (including, but not limited to, the absence of in-person examination) and has agreed to be treated using telemedicine. The patient's/patient's family's questions regarding telemedicine have been answered.    If the visit was completed in an ambulatory setting, the patient and/or parent/guardian has also been advised to contact their provider???s office for worsening conditions, and  seek emergency medical treatment and/or call 911 if the patient deems either necessary.

## 2022-01-18 ENCOUNTER — Ambulatory Visit: Admit: 2022-01-18 | Discharge: 2022-01-19 | Payer: MEDICARE

## 2022-04-16 ENCOUNTER — Ambulatory Visit
Admit: 2022-04-16 | Discharge: 2022-04-17 | Payer: MEDICARE | Attending: Orthopaedic Surgery | Primary: Orthopaedic Surgery

## 2022-04-16 ENCOUNTER — Ambulatory Visit: Admit: 2022-04-16 | Discharge: 2022-04-17 | Payer: MEDICARE

## 2022-04-16 NOTE — Unmapped (Signed)
INTERIM HISTORY:  Pamela Mercer returns today for reevaluation of her left hand.  She is status post left Darrach, extensor tenosynovectomy, reconstruction of the left small finger radial sagittal band and DRUJ stabilization with ECU pronator tendon transfer on 03/14/2021. She was doing well from this at her last visit on 06/08/2021. She presents today with left hand swelling and weakness.  She is bothered by stiffness and deformity of the small finger as well as some ulnar drift that she is noted in the ring and long finger at the level of the MP joints.    PHYSICAL EXAM:   - Examination of the left upper extremity shows skin incisions appear to be well-healed.  She has intact sensation throughout the hand with no numbness.  Fingertips are warm and well-perfused. 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 Passive PIP flexion of the small finger only to about 10 degrees. Ulnar drift of the MP joints of the long and ring fingers. Flexion of the long and ring fingers. [facility] is about 4-5 cm.     RADIOGRAPHS:   Imaging was interpreted by Dr. Jarold Motto.   - X-rays taken today of the left hand show her hardware and arthrodeses to be stable and well-healed.  She has progressive inflammatory changes throughout the MP and IP joints of the hand with ulnar drift of the long, ring, and small fingers..     ASSESSMENT:   1.  Left hand deformity in the setting of rheumatoid arthritis.  2.  Left Darrach and small finger extensor tendon realignment on 03/14/21  3.  Left thumb and index finger MP fusion, 10/02/16.  4.  Rheumatoid Arthritis.    PLAN:  Pamela Mercer is experiencing swelling, pain, and weakness in the left hand. She also has a swan neck deformity of the small finger. At this time, I would recommend that she see hand therapy to obtain a left small finger ring splint, left long and ring finger MP-stabilizing splints or buddy tape. We discussed the possibility of surgery to address some of her other symptoms, including ulnar drift. At this time, the patient is strongly disinterested in additional surgery. I will see her back on an as-needed basis.   ______________________________________________________________________    Documentation assistance was provided by Merleen Milliner, Scribe, on April 16, 2022 at 3:21 PM for [facility], MD and Benay Spice, MD    ----------------------------------------------------------------------------------------------------------------------  April 16, 2022 4:56 PM. Documentation assistance provided by the Scribe. I was present during the time the encounter was recorded. The information recorded by the Scribe was done at my direction and has been reviewed and validated by me.  ----------------------------------------------------------------------------------------------------------------------

## 2022-04-29 ENCOUNTER — Ambulatory Visit
Admit: 2022-04-29 | Payer: MEDICARE | Attending: Rehabilitative and Restorative Service Providers" | Primary: Rehabilitative and Restorative Service Providers"

## 2022-04-29 NOTE — Unmapped (Signed)
OUTPATIENT OCCUPATIONAL THERAPY    UPPER EXTREMITY EVALUATION    Patient Name: Pamela Mercer  Date of Birth:02-Feb-1951  Date: 04/29/2022  Visit #: 1  Encounter Diagnoses   Name Primary?   ??? Rheumatoid arthritis involving multiple sites with positive rheumatoid factor (CMS-HCC)    ??? Decreased range of motion of finger of left hand Yes     Therapy Diagnosis: muscle weakness   Certification dates: 04/29/22-07/30/22  Reason for referral :   Evaluation with suggestions for treatment    Special Instructions: Left small finger ring splint, left long and ring finger MP stabilization splints for buddy tape     RADIOGRAPHS:   Imaging was interpreted by Dr. Jarold Motto.   - X-rays taken today of the left hand show her hardware and arthrodeses to be stable and well-healed.  She has progressive inflammatory changes throughout the MP and IP joints of the hand with ulnar drift of the long, ring, and small fingers.Pamela Mercer   ??  ASSESSMENT:   1.  Left hand deformity in the setting of rheumatoid arthritis.  2.  Left Darrach and small finger extensor tendon realignment on 03/14/21  3.  Left thumb and index finger MP fusion, 10/02/16.  4.  Rheumatoid Arthritis.  ??  PLAN:  Pamela Mercer is experiencing swelling, pain, and weakness in the left hand. She also has a swan neck deformity of the small finger. At this time, I would recommend that she see hand therapy to obtain a left small finger ring splint, left long and ring finger MP-stabilizing splints or buddy tape. We discussed the possibility of surgery to address some of her other symptoms, including ulnar drift. At this time, the patient is strongly disinterested in additional surgery. I will see her back on an as-needed basis.   ______________________________________________________________________    Referring Provider: Barnett Applebaum*  Onset of Symptoms: surgery was 03/14/21  Per Referring Provider's note: INTERIM HISTORY:  Pamela Mercer returns today for reevaluation of her left hand.  She is status post left Darrach, extensor tenosynovectomy, reconstruction of the left small finger radial sagittal band and DRUJ stabilization with ECU pronator tendon transfer on 03/14/2021. She was doing well from this at her last visit on 06/08/2021. She presents today with left hand swelling and weakness.  She is bothered by stiffness and deformity of the small finger as well as some ulnar drift that she is noted in the ring and long finger at the level of the MP joints.    Communication preference: verbal, written, visual    Prognosis: fair/good due to long standing issues with hand     I reviewed the no-show/attendance policy with the patient and caregiver(s). The family is aware that they must call to cancel appointments more than 24 hours in advance. They are also aware that if they late cancel or no-show three times, we reserve the right to cancel their remaining appointments. This policy is in place to allow Pamela Mercer to best serve the needs of our caseload.       OT ASSESSMENT:   71 y.o. year old female with above diagnosis. Patient requires skilled Occupational Therapy services for splinting, pt education , task modifications as needed . One visit only this date for the above      Moderate complexity: This patient demonstrates 3-5 performance deficits relating to physical, cognitive and psychosocial skills that result in activity limitations and/or participation restrictions.  This patient may have comorbidities affecting occupational performance.  Please refer to Current level of  function section for further details.       Social and Occupational:  Pamela Mercer  Alone   Will move to her sisters place   Current Level of Function:  independent    Previous Level of Function: Pt was previously independent with all ADLs and IADLs.     ORTHOTIC RECOMMENDATIONS:  Issued size 8 oval eight splint for digit 5.   Buddy tape digits 3 and 2 then 4 to 2/3.   Can wear at night for better MP alignment. Pt was more pleased with the buddy straps versus our discussion of custom hard thermoplastic splints     Short Term Goals:(1 session )  1. Pt educated in use of buddy straps for more optimal positioning of MP's-met   2. Pt educated in oval 8 use for swan neck digit 5.  -met   3. Pt educated in observing for any circulation/skin issues with oval 8-met         OT  PLAN OF CARE: no further OT at this time   Pt will participate in:   Therapeutic exercise  Therapeutic activity  Self care/home management  Joint mobilizations  Modalities as appropriate  Patient education   Soft tissue mobilization   Scar management  Strengthening as appropriate      Planned frequency and duration of treatment:  One visit this date . Plan will be adjusted as necessary.     Patient in agreement with plan of care?: Yes    SUBJECTIVE:  Patient goals: splints    PAST MEDICAL HISTORY:  Reviewed   Past Medical History:   Diagnosis Date   ??? Basal cell carcinoma    ??? Deep vein thrombosis (CMS-HCC)    ??? Hypothyroidism    ??? Joint pain    ??? Squamous cell skin cancer        Past Surgical History: Reviewed  Past Surgical History:   Procedure Laterality Date   ??? Abdominal surgey     ??? CHOLECYSTECTOMY     ??? HAND SURGERY     ??? JOINT REPLACEMENT     ??? Left wrist synovectomy     ??? PR EXC TUM/VASC MAL SFT TISS HAND/FNGR SUBQ <1.5CM Right 11/29/2015    Procedure: EXCISION, TUMOR OR VASCULAR MALFORMATION, SOFT TISSUE OF HAND, SUBCUTANEOUS; LESS THAN 1.5 CM;  Surgeon: Daisy Lazar, MD;  Location: ASC OR [facility];  Service: Orthopedics   ??? PR EXCIS DISTAL ULNA,PART/COMPLETE Left 03/14/2021    Procedure: EXCISION DISTAL ULNA, PARTIAL/COMPLETE;  Surgeon: Theodora Blow Jacqlyn Krauss, MD;  Location: [facility] OR [facility];  Service: Orthopedics   ??? PR EXCIS SYNOV WRIST,EXTENS TENDON Left 03/14/2021    Procedure: SYNOVECTOMY EXTENSOR TENDON SHEATH WRIST SNGL;  Surgeon: Theodora Blow Jacqlyn Krauss, MD;  Location: [facility] OR [facility];  Service: Orthopedics   ??? PR FUSION FINGER JOINT Right 11/29/2015    Procedure: ARTHRODESIS, INTERPHALANGEAL JOINT, WITH OR WITHOUT INTERNAL FIXATION--R INDEX;  Surgeon: Theodora Blow Jacqlyn Krauss, MD;  Location: [facility] OR [facility];  Service: Orthopedics   ??? PR FUSION MC-P JT Left 10/02/2016    Procedure: ARTHRODESIS, METACARPOPHALANGEAL JOINT, WITH OR WITHOUT INTERNAL FIXATION--L THUMB & INDEX;  Surgeon: Theodora Blow Jacqlyn Krauss, MD;  Location: [facility] OR [facility];  Service: Orthopedics   ??? PR RAD EXCIS WRIST SYNOV/TENDON,EXTEN Right 11/29/2015    Procedure: RAD EXC BURSA WRIST; EXTENSORS W/WO TRANSPOSIT;  Surgeon: Daisy Lazar, MD;  Location: [facility] OR [facility];  Service: Orthopedics   ??? PR REMOVAL OF IMPLANT FROM  HAND/FINGR Right 11/29/2015    Procedure: REMOV IMPLNT FROM FINGER/HAND--R INDEX;  Surgeon: Theodora Blow Jacqlyn Krauss, MD;  Location: [facility] OR [facility];  Service: Orthopedics   ??? SKIN BIOPSY         Allergies: Reviewed  Infliximab, Lipitor [atorvastatin], Shellfish containing products, Ciprofloxacin, Codeine, Meperidine, Methotrexate, Nsaids (non-steroidal anti-inflammatory drug), Penicillins, Sulindac, and Tolmetin    Medications: Reviewed    Current Outpatient Medications:   ???  acetaminophen 500 mg coapsule, Take 1,000 mg by mouth every eight (8) hours., Disp: 42 capsule, Rfl: 0  ???  ascorbic acid, vitamin C, (VITAMIN C) 500 MG tablet, Take 1 tablet (500 mg total) by mouth daily., Disp: 14 tablet, Rfl: 0  ???  aspirin (ECOTRIN) 81 MG tablet, Take 81 mg by mouth daily., Disp: , Rfl:   ???  cholecalciferol, vitamin D3, (CHOLECALCIFEROL) 1,000 unit tablet, Take 1,000 Units by mouth., Disp: , Rfl:   ???  DULoxetine (CYMBALTA) 60 MG capsule, Take 60 mg by mouth daily., Disp: , Rfl:   ???  levothyroxine (SYNTHROID, LEVOTHROID) 75 MCG tablet, TAKE 1 TABLET BY MOUTH EVERY DAY, Disp: , Rfl:   ???  omeprazole (PRILOSEC) 20 MG capsule, TAKE 1 CAPSULE(20 MG) BY MOUTH DAILY, Disp: , Rfl:   ???  predniSONE (DELTASONE) 5 MG tablet, Take 1 tablet (5 mg total) by mouth daily., Disp: 180 tablet, Rfl: 3  ???  triamterene-hydrochlorothiazide (MAXZIDE-25) 37.5-25 mg per tablet, Take 1 tablet by mouth daily., Disp: , Rfl:     Precautions: none     Prior OT Service: yes    Pain: 0/10  Uncomfortable       OBJECTIVE        Upper Extremity Function:    Shoulder:  To about 90* (not measured)    Hand:   Pt is right  hand dominant               AROM (degrees) Date:  Right Date:  Left   Wrist   extension/flexion     Supination/pronation     rad/uln deviation     Composite flex to [facility]   (cm lack)     Index     Middle     Ring     Small     Digit Extension     Thumb  Opposition        Gross grip strength (pounds)  Position 2  Right  Left  DATE:     Pinch strength (pounds)  Right /Left   Lateral  palmar DATE:       Wound/Incision(s) or Scar : healed       Self care/home management (  20 min  )     Left hand :  Swan neck in digit 5  MP ulnar drift in digits 3 and 4  Pt states digit 4 is crossing over digit 5 when she wakes in the morning     3/4 buddy strap placed digits 3 to 2 then 1/2 strap placed digit 2 to digits 3 and 4.  Held patient in much better MP alignment .  Discussed thermoplastic splinting with pt as well. Pt pleased with the buddy straps at this time.  Prefers them over hard plastic.   Size 8 oval 8 placed on digit 5.   Pt instructed to check skin and remove if any circulation issues occur .  Info on the company issued if pt needs more.  Patient and therapist wore a protective mask and this therapist wore protective eyewear during the entirety of the treatment session.    Treatment Rendered:   Self Care/Home Training: 20 min    Total Evaluation Time: 15 mins    Patient Education:  Topics: home program, disease process  Education Provided to: patient  Education Type: education, demonstration, literature  Response to education/teachback: verbal understanding received, return demonstration    I attest that I have reviewed the above information.  SignedLeanora Ivanoff, OT  04/29/2022 9:42 AM

## 2023-02-10 DIAGNOSIS — M0579 Rheumatoid arthritis with rheumatoid factor of multiple sites without organ or systems involvement: Principal | ICD-10-CM

## 2023-02-10 MED ORDER — PREDNISONE 5 MG TABLET
ORAL_TABLET | Freq: Every day | ORAL | 3 refills | 180 days | Status: CP
Start: 2023-02-10 — End: ?

## 2023-02-11 NOTE — Unmapped (Signed)
Prednisone refill ( adding [name], patient needs appt)  Last ov: Visit date not found  Next ov: Visit date not found

## 2023-02-12 ENCOUNTER — Other Ambulatory Visit: Payer: Self-pay | Admitting: Family Medicine

## 2023-02-12 DIAGNOSIS — R9389 Abnormal findings on diagnostic imaging of other specified body structures: Secondary | ICD-10-CM

## 2023-02-12 DIAGNOSIS — R911 Solitary pulmonary nodule: Secondary | ICD-10-CM

## 2023-02-13 ENCOUNTER — Ambulatory Visit
Admission: RE | Admit: 2023-02-13 | Discharge: 2023-02-13 | Disposition: A | Discharge status: Home / Self Care | Payer: Medicare HMO | Source: Ambulatory Visit | Attending: Family Medicine | Admitting: Family Medicine

## 2023-02-13 DIAGNOSIS — R9389 Abnormal findings on diagnostic imaging of other specified body structures: Secondary | ICD-10-CM | POA: Diagnosis present

## 2023-02-13 DIAGNOSIS — R911 Solitary pulmonary nodule: Secondary | ICD-10-CM | POA: Insufficient documentation

## 2023-02-13 MED ORDER — IOHEXOL 300 MG/ML  SOLN
75.0000 mL | Freq: Once | INTRAMUSCULAR | Status: AC | PRN
  Administered 2023-02-13: 75 mL via INTRAVENOUS

## 2023-02-18 ENCOUNTER — Ambulatory Visit: Admit: 2023-02-18 | Discharge: 2023-02-19 | Payer: MEDICARE | Attending: Rheumatology | Primary: Rheumatology

## 2023-02-18 DIAGNOSIS — M0579 Rheumatoid arthritis with rheumatoid factor of multiple sites without organ or systems involvement: Principal | ICD-10-CM

## 2023-02-18 DIAGNOSIS — M810 Age-related osteoporosis without current pathological fracture: Principal | ICD-10-CM

## 2023-02-18 DIAGNOSIS — Z1159 Encounter for screening for other viral diseases: Principal | ICD-10-CM

## 2023-02-18 LAB — CBC W/ AUTO DIFF
BASOPHILS ABSOLUTE COUNT: 0.1 10*9/L (ref 0.0–0.1)
BASOPHILS RELATIVE PERCENT: 0.9 %
EOSINOPHILS ABSOLUTE COUNT: 0.2 10*9/L (ref 0.0–0.5)
EOSINOPHILS RELATIVE PERCENT: 1.9 %
HEMATOCRIT: 38.5 % (ref 34.0–44.0)
HEMOGLOBIN: 13.1 g/dL (ref 11.3–14.9)
LYMPHOCYTES ABSOLUTE COUNT: 1.9 10*9/L (ref 1.1–3.6)
LYMPHOCYTES RELATIVE PERCENT: 20.1 %
MEAN CORPUSCULAR HEMOGLOBIN CONC: 34 g/dL (ref 32.0–36.0)
MEAN CORPUSCULAR HEMOGLOBIN: 27.4 pg (ref 25.9–32.4)
MEAN CORPUSCULAR VOLUME: 80.5 fL (ref 77.6–95.7)
MEAN PLATELET VOLUME: 7.3 fL (ref 6.8–10.7)
MONOCYTES ABSOLUTE COUNT: 0.5 10*9/L (ref 0.3–0.8)
MONOCYTES RELATIVE PERCENT: 5.5 %
NEUTROPHILS ABSOLUTE COUNT: 6.7 10*9/L (ref 1.8–7.8)
NEUTROPHILS RELATIVE PERCENT: 71.6 %
PLATELET COUNT: 314 10*9/L (ref 150–450)
RED BLOOD CELL COUNT: 4.78 10*12/L (ref 3.95–5.13)
RED CELL DISTRIBUTION WIDTH: 14.9 % (ref 12.2–15.2)
WBC ADJUSTED: 9.3 10*9/L (ref 3.6–11.2)

## 2023-02-18 LAB — CREATININE
CREATININE: 0.99 mg/dL
EGFR CKD-EPI (2021) FEMALE: 62 mL/min/{1.73_m2} (ref >=60)

## 2023-02-18 LAB — PHOSPHORUS: PHOSPHORUS: 3.1 mg/dL (ref 2.4–5.1)

## 2023-02-18 LAB — CALCIUM: CALCIUM: 9.8 mg/dL (ref 8.7–10.4)

## 2023-02-18 LAB — C-REACTIVE PROTEIN: C-REACTIVE PROTEIN: 7 mg/L (ref <=10.0)

## 2023-02-18 NOTE — Unmapped (Signed)
RHEUMATOLOGY FOLLOWUP VISIT     Primary care MD: Dr. Kandyce Rud    Consulting physician: Dr. Pearlean Brownie (neurology)    HPI: Pamela Mercer is a  72 y.o. woman with longstanding RF/CCP+ RA. She returns today for routine face to face followup.  Her last visit was one year ago on Telehealth.  She remains on low dose prednisone 5 mg daily.   She has not been on DMARDs for many years and she prefers to remain off.       Previous therapies have included:  MTX-- D/C for ? Pulmonary toxicity  Enbrel  D/C in 2011 'making her sick'-- Not sure when it was started.  She came to me in 2008 on this.   Orencia (2011-2014) D/C due to cost    Has also taken oral gold, leflunomide, HCQ.  Humira--> injection site reactions.  Remicade--> peritonitis    Ms. Halley reports that she lost her voice around Christmas.  Prior to that she was doing OK.  She had a migraine HA which lasted about 3 day.  She has had chronic SOB but this has been worse over the last months.      She recently had some CHEST imaging recently and she tells me that she has pulmonary nodules.  She will be referred to PULMONOLOGY.  She has previously been followed by pulmonary here for an HRCT which had GGOs which resolved.  She was noted to have smal calcified granulomas at that time.  She was not symptomatic at the time of that evaluation in 2009.      CT at [facility] recently---  1. Multiple bilateral calcified (granulomata) and noncalcified pulmonary nodules measuring up to 5 mm. Calcified granulomata likely account for the findings on x-ray. Given dominant noncalcified nodule is 5 mm diameter, follow-up recommendations are based on this nodule. No followup needed if patient is low-risk (and has no known or suspected primary neoplasm). Non-contrast chest CT can be considered in 12 months if patient is high-risk. This recommendation follows the consensus statement: Guidelines for Management of Incidental Pulmonary Nodules Detected on CT Images: From the Fleischner Society Society 2017; Radiology 2017; 284:228-243.   2. 2 mm nonobstructing stone lower pole right kidney, incompletely   visualized.   3. Aortic Atherosclerosis (ICD10-I70.0).     In 2022 she had left wrist distal ulna resection with extensor tenosynovectomy with Dr. Jarold Motto. She reports that her hands are doing ok.  The L index finger is swollen and she has a chronic boutonierre deformity.  She is not interested in additional surgery.     She has had some shoulder pain.  This is helped by gabapentin.  She is sleeping OK.  She always has some pain.  THe shoulders are usually the worst.     She has had a lot of falls but she has not had any fractures. She has not exercised since her dog died 2 years ago. Her last bone density was in 2023 and showed osteopenia.     In the past she has been treated with multiple agents (above)  Most recently she was on Orencia but stopped I think primarily for financial reasons.  She has been maintained on low dose prednisone by her choice for many years.  We have discussed restarting therapy many times but she has declined.      She is now moved in with her sister and brother in law who help her with household tasks.      She is no  longer having mammograms by her choice.  She has never had a colonoscopy.  She is not inclined to treat any cancer should it be diagnosed.       ROS; 10 systems are reviewed and are negative except that mentioned in the HPI    Medication Sig    acetaminophen 500 mg coapsule Take 1,000 mg by mouth every eight (8) hours.    ascorbic acid, vitamin C, (VITAMIN C) 500 MG tablet Take 1 tablet (500 mg total) by mouth daily.    aspirin (ECOTRIN) 81 MG tablet Take 81 mg by mouth daily.    cholecalciferol, vitamin D3, (CHOLECALCIFEROL) 1,000 unit tablet Take 1,000 Units by mouth.    DULoxetine (CYMBALTA) 60 MG capsule Take 60 mg by mouth daily.    levothyroxine (SYNTHROID, LEVOTHROID) 75 MCG tablet TAKE 1 TABLET BY MOUTH EVERY DAY    omeprazole (PRILOSEC) 20 MG capsule CAPSULE(20 MG) BY MOUTH DAILY    predniSONE (DELTASONE) 5 MG tablet Take 1 tablet (5 mg total) by mouth daily.    triamterene-hydrochlorothiazide (MAXZIDE-25) 37.5-25 mg per tablet Take 1 tablet by mouth daily.     Immunization History   Administered Date(s) Administered    COVID-19 VACC,MRNA,(PFIZER)(PF)(IM) 02/12/2020, 03/04/2020, 10/06/2020    Influenza Vaccine Quad (IIV4 PF) 54mo+ injectable 12/03/2016, 09/18/2017, 10/06/2019    Influenza Virus Vaccine, unspecified formulation 10/13/2014, 10/20/2015    PNEUMOCOCCAL POLYSACCHARIDE 23 10/24/2009, 01/10/2015    Pneumococcal Conjugate 13-Valent 07/26/2014    TdaP 11/03/2007, 12/09/2011     Allergies   Allergen Reactions    Infliximab Other (See Comments) and Anaphylaxis     Cough, rash, kidneys pulsing. vomiting    Shellfish Containing Products Rash and Swelling    Nsaids (Non-Steroidal Anti-Inflammatory Drug)      clinoril    Tolmetin     Ciprofloxacin Rash     Other reaction(s): UNKNOWN    Codeine Nausea And Vomiting     Severe vomiting  Severe vomiting    Meperidine Nausea And Vomiting    Methotrexate Nausea And Vomiting and Rash    Penicillins Rash    Sulindac Rash     ROS; 10 systems are reviewed and are negative except that mentioned in the HPI      IMAGING  DXA March 2018  The bone mineral density in the spine measuring L1 to 4 measures 0.888 gm/cm2.  The  Z score is 0.2 and the T score is -1.4.  This value is above the fracture risk threshold.  The total bone mineral density in the proximal left femur measures 0.777 gm/cm2.  The Z score is -0.2 and the T score is -1.4.  This value is above the fracture risk threshold.  The femoral neck density is 0.700 gm/cm2, and the T score is -1.3.  The other T scores range from -2.0 to -0.9.    L WRIST 2022  Diffuse osteopenia. Unchanged first and second MCP arthrodesis. Hardware is without fracture. There is no surrounding lucency to suggest loosening or infection. Progress joint space narrowing and erosive changes noted at the wrist and metacarpophalangeal joints with slight increased erosive changes also noted at the ulnar styloid. Mild thumb base osteoarthrosis.      DXA 2023  Left Hip       Total Hip           BMD:0.737 (g/cm)             T score: -1.7  Comparison:               5.2% decrease since baseline comparison study, which is statistically significant.     Femoral Neck:           BMD:0.615 (g/cm)           T score: -2.1             Comparison:               12.2% decrease since baseline comparison study, which is statistically significant.     WHO classification: LOW BONE MASS.       Assessment and Plan:   Pamela Mercer is a 72 year old woman with longstanding seropositive erosive rheumatoid arthritis. She has been managed (her choice) with just low dose prednisone for many years.    She has had progressive joint damage and resulting joint dysfunction.  She is increasingly unhappy about this.  We reviewed this in detail today.  I advised that she is likely to have progressive joint damage with pain and dysfunction over time.  Any damage that has been done is irreversible and I would still advise the use of DMARDs and/ or biologics to prevent further damage.      She will continue prednisone 5 mg daily.     Bone Health  Osteopenia with high risk for fracture based on the above DXA  Recommend RECLAST IV yearly for 3 years.  Risks and benefits reviewed.   THerapy plan written.  Ca, Phos, Vit D Creat orderred BMD:0.615 (g/cm)           T score: -2.1             Comparison:               12.2% decrease since baseline comparison study, which is statistically significant.     WHO classification: LOW BONE MASS.       Assessment and Plan:   Pamela Mercer is a 72 year old woman with longstanding seropositive erosive rheumatoid arthritis. She has been managed (her choice) with just low dose prednisone for many years.    She has had progressive joint damage and resulting joint dysfunction.  She is increasingly unhappy about this.  We reviewed this in detail today.  I advised that she is likely to have progressive joint damage with pain and dysfunction over time.  Any damage that has been done is irreversible and I would still advise the use of DMARDs and/ or biologics to prevent further damage.  We discussed going back to Orencia which she tolerated and was effective.  However, given her pulomany symptoms I have advised that we wait until after she sees pulmonary before making a decision.  Other options might include Tocilizumab, Rituximab.  Would probably avoid JAK inhibitors due to her history of CVA.   She will continue prednisone 5 mg daily for now.   We will touch base after the pulmonary consultation.    Bone Health  Osteopenia with high risk for fracture based on the above DXA  Recommend RECLAST IV yearly for 3 years.  Risks and benefits reviewed.   Therapy plan written.  Ca, Phos, Vit D Creat orderred    Followup is scheduled in 6 months.     I personally spent 40 minutes face-to-face and non-face-to-face in the care of this patient, which includes all pre, intra, and post visit time on the date of service.  All documented time was specific to  the E/M visit and does not include any procedures that may have been performed.

## 2023-02-18 NOTE — Unmapped (Signed)
Labs today    Please let me know when you have seen the pulmonologist.  Then we can discuss possible therapy for your RA.     Plan for RECLAST infusion

## 2023-02-19 DIAGNOSIS — M810 Age-related osteoporosis without current pathological fracture: Principal | ICD-10-CM

## 2023-02-21 LAB — QUANTIFERON TB GOLD PLUS
QUANTIFERON ANTIGEN 1 MINUS NIL: -0.01 [IU]/mL
QUANTIFERON ANTIGEN 2 MINUS NIL: -0.02 [IU]/mL
QUANTIFERON MITOGEN: 3.91 [IU]/mL
QUANTIFERON TB GOLD PLUS: NEGATIVE
QUANTIFERON TB NIL VALUE: 0.05 [IU]/mL

## 2023-02-21 LAB — TB AG2: TB AG2 VALUE: 0.03

## 2023-02-21 LAB — HEPATITIS B SURFACE ANTIBODY
HEPATITIS B SURFACE ANTIBODY QUANT: <8 m[IU]/mL (ref <8.00)
HEPATITIS B SURFACE ANTIBODY: NONREACTIVE

## 2023-02-21 LAB — TB MITOGEN: TB MITOGEN VALUE: 3.96

## 2023-02-21 LAB — HEPATITIS C ANTIBODY: HEPATITIS C ANTIBODY: NONREACTIVE

## 2023-02-21 LAB — TB AG1: TB AG1 VALUE: 0.04

## 2023-02-21 LAB — HEPATITIS B SURFACE ANTIGEN: HEPATITIS B SURFACE ANTIGEN: NONREACTIVE

## 2023-02-21 LAB — HEPATITIS B CORE ANTIBODY, TOTAL: HEPATITIS B CORE TOTAL ANTIBODY: NONREACTIVE

## 2023-02-21 LAB — TB NIL: TB NIL VALUE: 0.05

## 2023-02-24 LAB — VITAMIN D 25 HYDROXY: VITAMIN D, TOTAL (25OH): 29.2 ng/mL (ref 20.0–80.0)

## 2023-03-07 ENCOUNTER — Ambulatory Visit: Admit: 2023-03-07 | Discharge: 2023-03-08 | Payer: MEDICARE

## 2023-03-07 MED ADMIN — acetaminophen (TYLENOL) tablet 650 mg: 650 mg | ORAL | @ 19:00:00 | Stop: 2023-03-07

## 2023-03-07 MED ADMIN — zoledronic acid-mannitol&water (RECLAST) 5 mg/100 mL infusion 5 mg: 5 mg | INTRAVENOUS | @ 19:00:00 | Stop: 2023-03-07

## 2023-03-07 NOTE — Unmapped (Signed)
Pt presents for Reclast infusion.  VSS, premeds administered.  Pt aware of potential reaction/side effects, call bell within reach.    1345  Reclast started  1415  Reclast complete.  Pt tolerated without complication, VSS.  IV flushed per policy and d/c'd, gauze and coban applied. Patient observed for at least 30 minutes.  1445  Pt left clinic in no acute distress.

## 2023-03-11 NOTE — Unmapped (Signed)
Pt LVM stating that she had her Reclast infusion on 3/8, no complications per infusion note, she vomited in the car on her way home.  She has had N/V/D all weekend which just started to resolve this morning.  She denies seeing any blood in her emesis or stool.  Denies dizziness.  Pt stated she did have a HA for 2 days after the infusion, but that has resolved.  She denies any bony pain or fever. She is staying hydrated. Informed pt these could be SE from Reclast and will notifiy MD, and let us know if she does not continue to improve or has new symptoms, pt VU

## 2023-09-09 ENCOUNTER — Ambulatory Visit: Admit: 2023-09-09 | Discharge: 2023-09-09 | Payer: MEDICARE | Attending: Rheumatology | Primary: Rheumatology

## 2023-09-09 DIAGNOSIS — M0579 Rheumatoid arthritis with rheumatoid factor of multiple sites without organ or systems involvement: Principal | ICD-10-CM

## 2023-09-09 DIAGNOSIS — M810 Age-related osteoporosis without current pathological fracture: Principal | ICD-10-CM

## 2023-09-09 NOTE — Unmapped (Addendum)
Please discuss mental health with Dr. Larwance Sachs.    You can also check with [employer] about mental health professionals in network.     FLU shot today    If you would like to discuss a trial of Tocilizumab (info attached) please let me know.

## 2023-09-09 NOTE — Unmapped (Signed)
RHEUMATOLOGY FOLLOWUP VISIT     Primary care MD: Dr. Kandyce Rud    Consulting physician: Dr. Pearlean Brownie (neurology)    HPI: Pamela Mercer is a  72 y.o. woman with longstanding RF/CCP+ RA who comes in today for routine follow up.   She remains on low dose prednisone 5 mg daily.   She has not been on DMARDs for many years and she has preferred to remain off.       Previous therapies have included:  MTX-- D/C for ? Pulmonary toxicity  Enbrel  D/C in 2011 'making her sick'-- Not sure when it was started.  She came to me in 2008 on this.   Orencia (2011-2014) D/C due to cost    Has also taken oral gold, leflunomide, HCQ.  Humira--> injection site reactions.  Remicade--> peritonitis    In 2022 she had left wrist distal ulna resection with extensor tenosynovectomy with Dr. Jarold Motto. She reports that her hands are doing ok.  She has a chronic boutonierre deformity.  She is not interested in additional surgery.     She is doing balance training at PT and she is schedule to go back tomorrow.  She has vertigo and the PT is helping with that.       Today Pamela Mercer reports that overall she is not feeling well.  She is tired and sick all the time.  She just had COVID (again) and it was bad.  She has had multiple episodes of COVID.  She was given Paxlovid (?) which she had an allergic reaction to.  She was sick for at least 2. 5 weeks.      Pamela Mercer was started on RECLAST for osteopenia with high risk for fracture after her last visit with me.  She had the 1st infusion and developed GI symptoms -- nausea/vomiting/diarrhea and a headache which lasted a few days.      She reports that she is not able to do enjoyable activities.  She admits that she is depressed and unhappy about how things are going for her.  She is not able to be as physically active as she would like.  She has joined a bible study, but is not really getting out much.      [facility] is [facility].  She has previously been followed by pulmonary here for an HRCT which had GGOs which resolved.  She was noted to have small calcified granulomas at that time.  She was not symptomatic at the time of that evaluation in 2009.      CT at [facility] showed---  1. Multiple bilateral calcified (granulomata) and noncalcified pulmonary nodules measuring up to 5 mm. Calcified granulomata likely account for the findings on x-ray. Given dominant noncalcified nodule is 5 mm diameter, follow-up recommendations are based on this nodule. No followup needed if patient is low-risk (and has no known or suspected primary neoplasm). Non-contrast chest CT can be considered in 12 months if patient is high-risk. This recommendation follows the consensus statement: Guidelines for Management of Incidental Pulmonary Nodules Detected on CT Images: From the Fleischner Society 2017; Radiology 2017; 284:228-243.   2. 2 mm nonobstructing stone lower pole right kidney, incompletely   visualized.   3. Aortic Atherosclerosis (ICD10-I70.0).     She is no longer having mammograms by her choice.  She has never had a colonoscopy.  She is not inclined to treat any cancer should it be diagnosed.  She is living in a basement apartment with her sister and brother in law.      ROS; 10 systems are reviewed and are negative except that mentioned in the HPI    Current Outpatient Medications   Medication Sig    acetaminophen 500 mg coapsule Take 1,000 mg by mouth every eight (8) hours.    albuterol HFA 90 mcg/actuation inhaler INHALE 2 INHALATIONS INTO THE LUNGS EVERY 4 (FOUR) HOURS AS NEEDED FOR WHEEZING    aspirin (ECOTRIN) 81 MG tablet Take 1 tablet (81 mg total) by mouth daily.    atenolol (TENORMIN) 25 MG tablet     cholecalciferol, vitamin D3, (CHOLECALCIFEROL) 1,000 unit tablet Take 1 tablet (25 mcg total) by mouth.    cranberry 400 mg cap Take 400 mg by mouth.    DULoxetine (CYMBALTA) 60 MG capsule Take 1 capsule (60 mg total) by mouth daily.    fluticasone propionate (FLONASE) 50 mcg/actuation nasal spray     gabapentin (NEURONTIN) 300 MG capsule     levothyroxine (SYNTHROID, LEVOTHROID) 75 MCG tablet TAKE 1 TABLET BY MOUTH EVERY DAY    montelukast (SINGULAIR) 10 mg tablet 1 tablet (10 mg total) nightly.    omeprazole (PRILOSEC) 20 MG capsule TAKE 1 CAPSULE(20 MG) BY MOUTH DAILY    predniSONE (DELTASONE) 5 MG tablet TAKE 1 TABLET BY MOUTH DAILY       Immunization History   Administered Date(s) Administered    COVID-19 VACC,MRNA,(PFIZER)(PF)(IM) 02/12/2020, 03/04/2020, 10/06/2020    Influenza Vaccine Quad (IIV4 PF) 16mo+ injectable 12/03/2016, 09/18/2017, 10/06/2019    Influenza Virus Vaccine, unspecified formulation 10/13/2014, 10/20/2015    PNEUMOCOCCAL POLYSACCHARIDE 23 10/24/2009, 01/10/2015    Pneumococcal Conjugate 13-Valent 07/26/2014    TdaP 11/03/2007, 12/09/2011     Allergies   Allergen Reactions    Infliximab Other (See Comments) and Anaphylaxis     Cough, rash, kidneys pulsing. vomiting    Shellfish Containing Products Rash and Swelling    Nsaids (Non-Steroidal Anti-Inflammatory Drug)      clinoril    Tolmetin     Ciprofloxacin Rash     Other reaction(s): UNKNOWN    Codeine Nausea And Vomiting     Severe vomiting  Severe vomiting    Meperidine Nausea And Vomiting    Methotrexate Nausea And Vomiting and Rash    Penicillins Rash    Sulindac Rash     ROS; 10 systems are reviewed and are negative except that mentioned in the HPI    EXAMINATION  BP 135/95  - Pulse 72  - Temp 36.2 ??C (97.2 ??F) (Temporal)  - Wt 86.6 kg (191 lb)  - BMI 32.79 kg/m??   General:appears tired.  Ambulates slowly.   HEENT: Head is normocephalic and atraumatic. Pupils equal round and reactive to light.  Oropharynx is clear.  Neck: supple without JVD, adenopathy, or thyromegaly  Lungs: decreased breath sounds, no wheezing or stridor  Heart: RRR without murmurs, rubs, or gallops  Extremities: no clubbing, cyanosis. 1+ edema  MSK: Decreased ROM in the neck in all planes, Nodules both elbows, chronic hands deformities and sequelae of surgery, scattered MCPs with mild synovitis.  Wrists with synovitis, Knees with full extension.    Office Visit on 02/18/2023   Component Date Value    Calcium 02/18/2023 9.8     Phosphorus 02/18/2023 3.1     CRP 02/18/2023 7.0     WBC 02/18/2023 9.3     RBC 02/18/2023 4.78     HGB 02/18/2023 13.1  HCT 02/18/2023 38.5     MCV 02/18/2023 80.5     MCH 02/18/2023 27.4     MCHC 02/18/2023 34.0     RDW 02/18/2023 14.9     MPV 02/18/2023 7.3     Platelet 02/18/2023 314     Neutrophils % 02/18/2023 71.6     Lymphocytes % 02/18/2023 20.1     Monocytes % 02/18/2023 5.5     Eosinophils % 02/18/2023 1.9     Basophils % 02/18/2023 0.9     Absolute Neutrophils 02/18/2023 6.7     Absolute Lymphocytes 02/18/2023 1.9     Absolute Monocytes 02/18/2023 0.5     Absolute Eosinophils 02/18/2023 0.2     Absolute Basophils 02/18/2023 0.1     Creatinine 02/18/2023 0.99     eGFR CKD-EPI (2021) Fema* 02/18/2023 62        IMAGING    L WRIST 2022  Diffuse osteopenia. Unchanged first and second MCP arthrodesis. Hardware is without fracture. There is no surrounding lucency to suggest loosening or infection. Progress joint space narrowing and erosive changes noted at the wrist and metacarpophalangeal joints with slight increased erosive changes also noted at the ulnar styloid. Mild thumb base osteoarthrosis.      DXA 2023  Left Hip       Total Hip           BMD:0.737 (g/cm)             T score: -1.7             Comparison:               5.2% decrease since baseline comparison study, which is statistically significant.     Femoral Neck:           BMD:0.615 (g/cm)           T score: -2.1             Comparison:               12.2% decrease since baseline comparison study, which is statistically significant.     WHO classification: LOW BONE MASS.       Assessment and Plan:   Pamela Mercer is a 72 year old woman with longstanding seropositive erosive rheumatoid arthritis. She has been managed (her choice) with just low dose prednisone for many years.    She has had progressive joint damage and resulting joint dysfunction.  She is increasingly unhappy about this.  At the last visit we talked about possibly going back on a biologics.    Today she is not sure that she wants to do this.  It is not clear to me how much benefit she might get since there is significant damage that is nor reversible.  She definitely does have some synovitis, so she may get some benefit.  We will only really know if we try.  We have discussed this in detail today and I have given her some info about tocilizumab to consider. She will continue prednisone 5 mg daily for now.       Mental Health  I think there is likely some component of depression which needs to be addressed.  She has been on Cymbalta for many years and perhaps this needs adjustment/changing.  I have asked her to discuss this with Dr. Larwance Sachs.  I also think she could benefit from the care of a psychiatrist and/or psychologist.  I have asked her to check in with her Medicare  provider to find physicians that are in her network.      Bone Health  Osteopenia with high risk for fracture based on the above DXA  Recommend RECLAST IV yearly for 3 years.  Reviewed importance of staying very well hydrated prior to the infusion.     FLU shot today    Followup is scheduled in 6 months.     I personally spent 35 minutes face-to-face and non-face-to-face in the care of this patient, which includes all pre, intra, and post visit time on the date of service.  All documented time was specific to the E/M visit and does not include any procedures that may have been performed.

## 2024-02-12 DIAGNOSIS — M0579 Rheumatoid arthritis with rheumatoid factor of multiple sites without organ or systems involvement: Principal | ICD-10-CM

## 2024-02-12 MED ORDER — PREDNISONE 5 MG TABLET
ORAL_TABLET | Freq: Every day | ORAL | 3 refills | 180.00 days | Status: CP
Start: 2024-02-12 — End: ?

## 2024-02-12 NOTE — Unmapped (Signed)
Prednisone refill  Last ov: 09/09/2023  Next ov: 09/14/2024

## 2024-02-12 NOTE — Unmapped (Signed)
Spoke with patient and let her know that medication had been sent to pharmacy.

## 2024-03-08 ENCOUNTER — Encounter: Admit: 2024-03-08 | Discharge: 2024-03-09 | Payer: MEDICARE

## 2024-03-08 MED ADMIN — zoledronic acid-mannitol&water (RECLAST) 5 mg/100 mL infusion 5 mg: 5 mg | INTRAVENOUS | @ 20:00:00 | Stop: 2024-03-08

## 2024-03-08 NOTE — Unmapped (Signed)
 Pt presents for Reclast infusion.  VSS, premeds administered.  Pt aware of potential reaction/side effects, call bell within reach.      Calcium   9.5  eGFR      69          1555  Reclast started  1627  Reclast complete.  Pt tolerated without complication, VSS. Observed for 30 minutes. Patient denies any symptoms at this time.   1700  IV flushed per policy and d/c'd, gauze and coban applied.  Pt left clinic in no acute distress.

## 2024-08-12 ENCOUNTER — Other Ambulatory Visit: Payer: Self-pay | Admitting: Pulmonary Disease

## 2024-08-12 DIAGNOSIS — J849 Interstitial pulmonary disease, unspecified: Secondary | ICD-10-CM

## 2024-08-20 ENCOUNTER — Ambulatory Visit
Admission: RE | Admit: 2024-08-20 | Discharge: 2024-08-20 | Disposition: A | Discharge status: Home / Self Care | Source: Ambulatory Visit | Attending: Pulmonary Disease | Admitting: Pulmonary Disease

## 2024-08-20 DIAGNOSIS — J849 Interstitial pulmonary disease, unspecified: Secondary | ICD-10-CM | POA: Diagnosis present

## 2024-09-28 ENCOUNTER — Other Ambulatory Visit: Payer: Self-pay | Admitting: Neurology

## 2024-09-28 DIAGNOSIS — G459 Transient cerebral ischemic attack, unspecified: Secondary | ICD-10-CM

## 2024-10-04 ENCOUNTER — Ambulatory Visit
Admission: RE | Admit: 2024-10-04 | Discharge: 2024-10-04 | Disposition: A | Discharge status: Home / Self Care | Source: Ambulatory Visit | Attending: Neurology | Admitting: Neurology

## 2024-10-04 DIAGNOSIS — G459 Transient cerebral ischemic attack, unspecified: Secondary | ICD-10-CM | POA: Diagnosis present
